# Patient Record
Sex: Female | Born: 1975
Health system: Southern US, Community
[De-identification: ages and names within clinical notes are randomized; demographics above are authoritative.]

## PROBLEM LIST (undated history)

## (undated) ENCOUNTER — Inpatient Hospital Stay (HOSPITAL_COMMUNITY): Payer: Self-pay

## (undated) DIAGNOSIS — G971 Other reaction to spinal and lumbar puncture: Secondary | ICD-10-CM

## (undated) DIAGNOSIS — I10 Essential (primary) hypertension: Secondary | ICD-10-CM

## (undated) HISTORY — PX: NO PAST SURGERIES: SHX2092

## (undated) HISTORY — DX: Essential (primary) hypertension: I10

## (undated) HISTORY — DX: Other reaction to spinal and lumbar puncture: G97.1

---

## 2011-12-01 NOTE — L&D Delivery Note (Signed)
Delivery Note At 8:53 AM a viable and healthy female was delivered via Vaginal, Spontaneous Delivery (Presentation: ; Occiput Anterior) after reduction of loose nuchal cord.  APGAR: 4, 9; weight 8 lb 4.3 oz (3751 g).   Placenta status: Intact, Spontaneous.  Cord: 3 vessels with the following complications: None.  Cord pH: 6.96  Anesthesia: None Local  Episiotomy: None Lacerations: 2nd degree Suture Repair: 3.0 monocryl Est. Blood Loss (mL): 250  Mom to postpartum.  Baby to nursery-stable.  [name] 09/04/2012, 9:30 AM    I was present for delivery and agree with note above. [facility]

## 2012-06-09 ENCOUNTER — Encounter: Payer: Self-pay | Admitting: Physician Assistant

## 2012-06-10 ENCOUNTER — Encounter (HOSPITAL_COMMUNITY): Payer: Self-pay | Admitting: *Deleted

## 2012-06-10 ENCOUNTER — Inpatient Hospital Stay (HOSPITAL_COMMUNITY)
Admission: AD | Admit: 2012-06-10 | Discharge: 2012-06-10 | Disposition: A | Discharge status: Home / Self Care | Payer: Medicaid Other | Source: Ambulatory Visit | Attending: Obstetrics & Gynecology | Admitting: Obstetrics & Gynecology

## 2012-06-10 DIAGNOSIS — Z3490 Encounter for supervision of normal pregnancy, unspecified, unspecified trimester: Secondary | ICD-10-CM

## 2012-06-10 DIAGNOSIS — O99891 Other specified diseases and conditions complicating pregnancy: Secondary | ICD-10-CM | POA: Insufficient documentation

## 2012-06-10 NOTE — MAU Provider Note (Signed)
Subjective: Patient has no complaints. States a car came to her house and drove her here.  She has no idea why she is here.   FHT: baseline 150, moderate variability, no accels or decels.  Will discharge home.  She has an appointment in the [facility] clinic on July 22 at 8 am.

## 2012-06-10 NOTE — MAU Note (Signed)
Pt has no complaints per interpreter. States she has no problems.

## 2012-06-10 NOTE — MAU Note (Signed)
Dr Birdie Sons in with patient discussing plan of care with interpreter. Pt states she was told to come here by her social worker Susie.

## 2012-06-12 NOTE — MAU Provider Note (Signed)
I have seen/examined this patient and agree with above. Terron Merfeld E.  

## 2012-06-20 ENCOUNTER — Encounter: Payer: Self-pay | Admitting: Obstetrics & Gynecology

## 2012-06-22 ENCOUNTER — Other Ambulatory Visit: Payer: Self-pay | Admitting: *Deleted

## 2012-06-22 DIAGNOSIS — O0930 Supervision of pregnancy with insufficient antenatal care, unspecified trimester: Secondary | ICD-10-CM

## 2012-06-22 NOTE — Progress Notes (Signed)
Representative from [employer] arrived to clinic registration window requesting pregnancy verification letter so that pt can apply for medicaid. Pt has no prenatal records and has first clinic visit on 06/30/12.  I scheduled [place] for pt on 06/27/12 @ 1500 and told the representative that I can compose the verification letter after pt's [place] is completed.  He will inform Ms. Richardson.

## 2012-06-27 ENCOUNTER — Ambulatory Visit (HOSPITAL_COMMUNITY)
Admission: RE | Admit: 2012-06-27 | Discharge: 2012-06-27 | Disposition: A | Discharge status: Home / Self Care | Payer: Medicaid Other | Source: Ambulatory Visit | Attending: Obstetrics & Gynecology | Admitting: Obstetrics & Gynecology

## 2012-06-27 ENCOUNTER — Encounter: Payer: Self-pay | Admitting: Obstetrics & Gynecology

## 2012-06-27 DIAGNOSIS — Z3689 Encounter for other specified antenatal screening: Secondary | ICD-10-CM | POA: Insufficient documentation

## 2012-06-27 DIAGNOSIS — O0930 Supervision of pregnancy with insufficient antenatal care, unspecified trimester: Secondary | ICD-10-CM | POA: Insufficient documentation

## 2012-06-29 ENCOUNTER — Ambulatory Visit (HOSPITAL_COMMUNITY): Payer: Self-pay

## 2012-06-30 ENCOUNTER — Encounter: Payer: Self-pay | Admitting: Physician Assistant

## 2012-07-04 ENCOUNTER — Encounter: Payer: Self-pay | Admitting: Family Medicine

## 2012-07-25 ENCOUNTER — Encounter: Payer: Self-pay | Admitting: Family Medicine

## 2012-07-25 ENCOUNTER — Ambulatory Visit (INDEPENDENT_AMBULATORY_CARE_PROVIDER_SITE_OTHER): Payer: Medicaid Other | Admitting: Family Medicine

## 2012-07-25 ENCOUNTER — Other Ambulatory Visit (HOSPITAL_COMMUNITY)
Admission: RE | Admit: 2012-07-25 | Discharge: 2012-07-25 | Disposition: A | Discharge status: Home / Self Care | Payer: Medicaid Other | Source: Ambulatory Visit | Attending: Family Medicine | Admitting: Family Medicine

## 2012-07-25 VITALS — BP 125/86 | Temp 96.7°F | Ht 67.5 in | Wt 225.0 lb

## 2012-07-25 DIAGNOSIS — Z113 Encounter for screening for infections with a predominantly sexual mode of transmission: Secondary | ICD-10-CM | POA: Insufficient documentation

## 2012-07-25 DIAGNOSIS — Z01419 Encounter for gynecological examination (general) (routine) without abnormal findings: Secondary | ICD-10-CM | POA: Insufficient documentation

## 2012-07-25 DIAGNOSIS — R87810 Cervical high risk human papillomavirus (HPV) DNA test positive: Secondary | ICD-10-CM | POA: Insufficient documentation

## 2012-07-25 DIAGNOSIS — O0930 Supervision of pregnancy with insufficient antenatal care, unspecified trimester: Secondary | ICD-10-CM | POA: Insufficient documentation

## 2012-07-25 LAB — CBC
Hemoglobin: 11.3 g/dL — ABNORMAL LOW (ref 12.0–15.0)
MCHC: 33.9 g/dL (ref 30.0–36.0)
Platelets: 240 10*3/uL (ref 150–400)
RBC: 4.17 MIL/uL (ref 3.87–5.11)

## 2012-07-25 LAB — DIFFERENTIAL
Basophils Relative: 0 % (ref 0–1)
Eosinophils Absolute: 0.1 10*3/uL (ref 0.0–0.7)
Monocytes Relative: 7 % (ref 3–12)
Neutro Abs: 4.6 10*3/uL (ref 1.7–7.7)
Neutrophils Relative %: 66 % (ref 43–77)

## 2012-07-25 LAB — POCT URINALYSIS DIP (DEVICE)
Bilirubin Urine: NEGATIVE
Ketones, ur: NEGATIVE mg/dL
Protein, ur: 30 mg/dL — AB
Specific Gravity, Urine: >=1.03 (ref 1.005–1.030)
pH: 5.5 (ref 5.0–8.0)

## 2012-07-25 MED ORDER — PRENATAL VITAMINS 0.8 MG PO TABS: 1.0000 | ORAL_TABLET | Freq: Every day | ORAL | Status: DC

## 2012-07-25 NOTE — Progress Notes (Signed)
New pt. Today in country x 2 months.  Has normal OB hx, no risk factors.  Reports care in [place], but no records.  Reports several u/s that confirmed due date of 10/2--She is sure of LMP and reports regular monthly cycles. Will get labs for her today, including, OB panel, 1 hour GCT, pap and GC/Chlam--will need GBS at next visit.  S>D obese.-U/S 7/29 showed 78%, symmetric. 1+ prot, but small amount of blood. Female circumcision noted.  Cx is long and closed.

## 2012-07-25 NOTE — Progress Notes (Signed)
Pulse 115 Has no prenatal records; Needs OB panel, HIV, pap smear; 1 hr gtt @1124 

## 2012-07-25 NOTE — Patient Instructions (Signed)

## 2012-07-26 LAB — RUBELLA SCREEN: Rubella: 185.5 IU/mL — ABNORMAL HIGH

## 2012-07-26 LAB — RPR

## 2012-07-26 LAB — HEPATITIS B SURFACE ANTIGEN: Hepatitis B Surface Ag: NEGATIVE

## 2012-07-26 LAB — CULTURE, OB URINE

## 2012-07-26 LAB — RH TYPE: Rh Type: POSITIVE

## 2012-07-26 LAB — ABO

## 2012-07-28 ENCOUNTER — Encounter: Payer: Self-pay | Admitting: Family Medicine

## 2012-07-28 ENCOUNTER — Telehealth: Payer: Self-pay | Admitting: *Deleted

## 2012-07-28 DIAGNOSIS — O0930 Supervision of pregnancy with insufficient antenatal care, unspecified trimester: Secondary | ICD-10-CM

## 2012-07-28 NOTE — Telephone Encounter (Signed)
Called [facility] and spoke with patients case worker [place] who gave me the patients phone number. Phone number [phone].

## 2012-07-28 NOTE — Telephone Encounter (Signed)
Message copied by Mannie Stabile on Thu Jul 28, 2012 11:32 AM ------      Message from: Reva Bores      Created: Thu Jul 28, 2012 11:28 AM       sched 3 hour

## 2012-07-28 NOTE — Telephone Encounter (Signed)
Called pt using language line interpreter # [phone] Arabic. Phone number is not for patient it is a company.

## 2012-07-28 NOTE — Telephone Encounter (Signed)
Called patient with Arabic interpreter from language line. It was determined that we did not have the appropriate dialect after trying to inform pt of results. Pt gave permission for [place] to speak to her husband. Her husband also did not understand the Arabic interpreter, We disconnected. I called again and used a [place] Arabic interpreter [phone]. Pts husband answered the phone and again the patient gave permission for [place] to talk to her husband. He immediately became very upset yelling and wouldn't let the interpreter speak. He then hung up and the interpreter told me that he thought that we said pt was a diabetic. He thinks we made her have diabetes by giving her the sugar drink. He says that his wife was never sick before she came to [place]. He stated that he does not trust [place]. I discussed with the interpreter trying to call the patient once more to try to explain the results again and try to help him understand that we are not giving her a diagnosis. We called again and husband answered the phone. He was calm and allowed the interpreter to fully explain the results and the purpose of the 3hr test. Also explained the health risks of gestational diabetes including fetal death. Pts husband again became irate and refused to listen any further. He told interpreter that he doesn't trust [place] and doesn't want his wife to come back to our clinic or any clinic for that matter. He said that they would just wait until her due date and then call 911. He told the interpreter that he doesn't want to speak with [place] any further. I thanked interpreter for her help.  I called [facility] back to talk with a case Production designer, theatre/television/film. I talked to [place] who is familiar with patient and her husband. I informed her that I couldn't give her any detailed results but that I needed her help getting the patient and her husband to come in for a visit to talk with a doctor. I told her to reassure them that we would not be doing any test or procedures  without their permission. Aundra Millet stated that she will talk with them this afternoon and felt confident that she could get them to come in. Pt scheduled for 0830 on 9/5. Aundra Millet will call me back if there is any problem. She said she would also try to get them a ride to the appt so that they will not have to take the bus.

## 2012-08-04 ENCOUNTER — Ambulatory Visit (INDEPENDENT_AMBULATORY_CARE_PROVIDER_SITE_OTHER): Payer: Medicaid Other | Admitting: Family Medicine

## 2012-08-04 VITALS — BP 122/81 | Temp 97.4°F | Wt 225.0 lb

## 2012-08-04 DIAGNOSIS — O0930 Supervision of pregnancy with insufficient antenatal care, unspecified trimester: Secondary | ICD-10-CM

## 2012-08-04 LAB — OB RESULTS CONSOLE GBS: GBS: NEGATIVE

## 2012-08-04 LAB — POCT URINALYSIS DIP (DEVICE)
Bilirubin Urine: NEGATIVE
Hgb urine dipstick: NEGATIVE
Ketones, ur: NEGATIVE mg/dL
Protein, ur: NEGATIVE mg/dL
Specific Gravity, Urine: 1.025 (ref 1.005–1.030)
pH: 5.5 (ref 5.0–8.0)

## 2012-08-04 NOTE — Patient Instructions (Signed)
Breastfeeding BENEFITS OF BREASTFEEDING For the baby  The first milk (colostrum) helps the baby's digestive system function better.   There are antibodies from the mother in the milk that help the baby fight off infections.   The baby has a lower incidence of asthma, allergies, and SIDS (sudden infant death syndrome).   The nutrients in breast milk are better than formulas for the baby and helps the baby's brain grow better.   Babies who breastfeed have less gas, colic, and constipation.  For the mother  Breastfeeding helps develop a very special bond between mother and baby.   It is more convenient, always available at the correct temperature and cheaper than formula feeding.   It burns calories in the mother and helps with losing weight that was gained during pregnancy.   It makes the uterus contract back down to normal size faster and slows bleeding following delivery.   Breastfeeding mothers have a lower risk of developing breast cancer.  NURSE FREQUENTLY  A healthy, full-term baby may breastfeed as often as every hour or space his or her feedings to every 3 hours.   How often to nurse will vary from baby to baby. Watch your baby for signs of hunger, not the clock.   Nurse as often as the baby requests, or when you feel the need to reduce the fullness of your breasts.   Awaken the baby if it has been 3 to 4 hours since the last feeding.   Frequent feeding will help the mother make more milk and will prevent problems like sore nipples and engorgement of the breasts.  BABY'S POSITION AT THE BREAST  Whether lying down or sitting, be sure that the baby's tummy is facing your tummy.   Support the breast with 4 fingers underneath the breast and the thumb above. Make sure your fingers are well away from the nipple and baby's mouth.   Stroke the baby's lips and cheek closest to the breast gently with your finger or nipple.   When the baby's mouth is open wide enough, place all  of your nipple and as much of the dark area around the nipple as possible into your baby's mouth.   Pull the baby in close so the tip of the nose and the baby's cheeks touch the breast during the feeding.  FEEDINGS  The length of each feeding varies from baby to baby and from feeding to feeding.   The baby must suck about 2 to 3 minutes for your milk to get to him or her. This is called a "let down." For this reason, allow the baby to feed on each breast as long as he or she wants. Your baby will end the feeding when he or she has received the right balance of nutrients.   To break the suction, put your finger into the corner of the baby's mouth and slide it between his or her gums before removing your breast from his or her mouth. This will help prevent sore nipples.  REDUCING BREAST ENGORGEMENT  In the first week after your baby is born, you may experience signs of breast engorgement. When breasts are engorged, they feel heavy, warm, full, and may be tender to the touch. You can reduce engorgement if you:   Nurse frequently, every 2 to 3 hours. Mothers who breastfeed early and often have fewer problems with engorgement.   Place light ice packs on your breasts between feedings. This reduces swelling. Wrap the ice packs in a   lightweight towel to protect your skin.   Apply moist hot packs to your breast for 5 to 10 minutes before each feeding. This increases circulation and helps the milk flow.   Gently massage your breast before and during the feeding.   Make sure that the baby empties at least one breast at every feeding before switching sides.   Use a breast pump to empty the breasts if your baby is sleepy or not nursing well. You may also want to pump if you are returning to work or or you feel you are getting engorged.   Avoid bottle feeds, pacifiers or supplemental feedings of water or juice in place of breastfeeding.   Be sure the baby is latched on and positioned properly while  breastfeeding.   Prevent fatigue, stress, and anemia.   Wear a supportive bra, avoiding underwire styles.   Eat a balanced diet with enough fluids.  If you follow these suggestions, your engorgement should improve in 24 to 48 hours. If you are still experiencing difficulty, call your lactation consultant or caregiver. IS MY BABY GETTING ENOUGH MILK? Sometimes, mothers worry about whether their babies are getting enough milk. You can be assured that your baby is getting enough milk if:  The baby is actively sucking and you hear swallowing.   The baby nurses at least 8 to 12 times in a 24 hour time period. Nurse your baby until he or she unlatches or falls asleep at the first breast (at least 10 to 20 minutes), then offer the second side.   The baby is wetting 5 to 6 disposable diapers (6 to 8 cloth diapers) in a 24 hour period by 5 to 6 days of age.   The baby is having at least 2 to 3 stools every 24 hours for the first few months. Breast milk is all the food your baby needs. It is not necessary for your baby to have water or formula. In fact, to help your breasts make more milk, it is best not to give your baby supplemental feedings during the early weeks.   The stool should be soft and yellow.   The baby should gain 4 to 7 ounces per week after he is 4 days old.  TAKE CARE OF YOURSELF Take care of your breasts by:  Bathing or showering daily.   Avoiding the use of soaps on your nipples.   Start feedings on your left breast at one feeding and on your right breast at the next feeding.   You will notice an increase in your milk supply 2 to 5 days after delivery. You may feel some discomfort from engorgement, which makes your breasts very firm and often tender. Engorgement "peaks" out within 24 to 48 hours. In the meantime, apply warm moist towels to your breasts for 5 to 10 minutes before feeding. Gentle massage and expression of some milk before feeding will soften your breasts, making  it easier for your baby to latch on. Wear a well fitting nursing bra and air dry your nipples for 10 to 15 minutes after each feeding.   Only use cotton bra pads.   Only use pure lanolin on your nipples after nursing. You do not need to wash it off before nursing.  Take care of yourself by:   Eating well-balanced meals and nutritious snacks.   Drinking milk, fruit juice, and water to satisfy your thirst (about 8 glasses a day).   Getting plenty of rest.   Increasing calcium in   your diet (1200 mg a day).   Avoiding foods that you notice affect the baby in a bad way.  SEEK MEDICAL CARE IF:   You have any questions or difficulty with breastfeeding.   You need help.   You have a hard, red, sore area on your breast, accompanied by a fever of 100.5 F (38.1 C) or more.   Your baby is too sleepy to eat well or is having trouble sleeping.   Your baby is wetting less than 6 diapers per day, by 5 days of age.   Your baby's skin or white part of his or her eyes is more yellow than it was in the hospital.   You feel depressed.  Document Released: 11/16/2005 Document Revised: 11/05/2011 Document Reviewed: 07/01/2009 Exit[employer] Patient Information 2012 [employer]. 

## 2012-08-04 NOTE — Progress Notes (Signed)
GBS today Pt. Refuses 3 hour test---Random BS today not fasting is 79.

## 2012-08-04 NOTE — Progress Notes (Signed)
P=106 

## 2012-08-08 ENCOUNTER — Encounter: Payer: Self-pay | Admitting: Family Medicine

## 2012-08-10 ENCOUNTER — Encounter: Payer: Self-pay | Admitting: Obstetrics and Gynecology

## 2012-08-10 ENCOUNTER — Ambulatory Visit (INDEPENDENT_AMBULATORY_CARE_PROVIDER_SITE_OTHER): Payer: Medicaid Other | Admitting: Obstetrics and Gynecology

## 2012-08-10 ENCOUNTER — Telehealth: Payer: Self-pay | Admitting: *Deleted

## 2012-08-10 VITALS — BP 117/79 | Temp 97.3°F | Wt 223.8 lb

## 2012-08-10 DIAGNOSIS — Z22330 Carrier of Group B streptococcus: Secondary | ICD-10-CM

## 2012-08-10 DIAGNOSIS — O09899 Supervision of other high risk pregnancies, unspecified trimester: Secondary | ICD-10-CM

## 2012-08-10 DIAGNOSIS — O99820 Streptococcus B carrier state complicating pregnancy: Secondary | ICD-10-CM

## 2012-08-10 DIAGNOSIS — O0930 Supervision of pregnancy with insufficient antenatal care, unspecified trimester: Secondary | ICD-10-CM

## 2012-08-10 LAB — POCT URINALYSIS DIP (DEVICE)
Glucose, UA: NEGATIVE mg/dL
Ketones, ur: NEGATIVE mg/dL
Protein, ur: 30 mg/dL — AB
Specific Gravity, Urine: 1.025 (ref 1.005–1.030)

## 2012-08-10 MED ORDER — FLUCONAZOLE 150 MG PO TABS: 150.0000 mg | ORAL_TABLET | Freq: Once | ORAL | Status: DC

## 2012-08-10 NOTE — Addendum Note (Signed)
Addended by: Jill Side on: 08/10/2012 02:22 PM   Modules accepted: Orders

## 2012-08-10 NOTE — Telephone Encounter (Signed)
Called pt and left message that we need her to call back and give [place] the name and street location of her pharmacy.  * Once pt has provided the information, her Rx for Diflucan from today needs to be called in or sent electronically.

## 2012-08-10 NOTE — Progress Notes (Signed)
Discussed contraception options -> elects Depo. GC/CT, GBS sent. Reviewed no seets diet. Declines cx exam. Check RCBG next.

## 2012-08-10 NOTE — Patient Instructions (Signed)
Pregnancy - Third Trimester The third trimester of pregnancy (the last 3 months) is a period of the most rapid growth for you and your baby. The baby approaches a length of 20 inches and a weight of 6 to 10 pounds. The baby is adding on fat and getting ready for life outside your body. While inside, babies have periods of sleeping and waking, suck their thumbs, and hiccups. You can often feel small contractions of the uterus. This is false labor. It is also called Braxton-Hicks contractions. This is like a practice for labor. The usual problems in this stage of pregnancy include more difficulty breathing, swelling of the hands and feet from water retention, and having to urinate more often because of the uterus and baby pressing on your bladder.  PRENATAL EXAMS  Blood work may continue to be done during prenatal exams. These tests are done to check on your health and the probable health of your baby. Blood work is used to follow your blood levels (hemoglobin). Anemia (low hemoglobin) is common during pregnancy. Iron and vitamins are given to help prevent this. You may also continue to be checked for diabetes. Some of the past blood tests may be done again.   The size of the uterus is measured during each visit. This makes sure your baby is growing properly according to your pregnancy dates.   Your blood pressure is checked every prenatal visit. This is to make sure you are not getting toxemia.   Your urine is checked every prenatal visit for infection, diabetes and protein.   Your weight is checked at each visit. This is done to make sure gains are happening at the suggested rate and that you and your baby are growing normally.   Sometimes, an ultrasound is performed to confirm the position and the proper growth and development of the baby. This is a test done that bounces harmless sound waves off the baby so your caregiver can more accurately determine due dates.   Discuss the type of pain  medication and anesthesia you will have during your labor and delivery.   Discuss the possibility and anesthesia if a Cesarean Section might be necessary.   Inform your caregiver if there is any mental or physical violence at home.  Sometimes, a specialized non-stress test, contraction stress test and biophysical profile are done to make sure the baby is not having a problem. Checking the amniotic fluid surrounding the baby is called an amniocentesis. The amniotic fluid is removed by sticking a needle into the belly (abdomen). This is sometimes done near the end of pregnancy if an early delivery is required. In this case, it is done to help make sure the baby's lungs are mature enough for the baby to live outside of the womb. If the lungs are not mature and it is unsafe to deliver the baby, an injection of cortisone medication is given to the mother 1 to 2 days before the delivery. This helps the baby's lungs mature and makes it safer to deliver the baby. CHANGES OCCURING IN THE THIRD TRIMESTER OF PREGNANCY Your body goes through many changes during pregnancy. They vary from person to person. Talk to your caregiver about changes you notice and are concerned about.  During the last trimester, you have probably had an increase in your appetite. It is normal to have cravings for certain foods. This varies from person to person and pregnancy to pregnancy.   You may begin to get stretch marks on your hips,   abdomen, and breasts. These are normal changes in the body during pregnancy. There are no exercises or medications to take which prevent this change.   Constipation may be treated with a stool softener or adding bulk to your diet. Drinking lots of fluids, fiber in vegetables, fruits, and whole grains are helpful.   Exercising is also helpful. If you have been very active up until your pregnancy, most of these activities can be continued during your pregnancy. If you have been less active, it is helpful  to start an exercise program such as walking. Consult your caregiver before starting exercise programs.   Avoid all smoking, alcohol, un-prescribed drugs, herbs and "street drugs" during your pregnancy. These chemicals affect the formation and growth of the baby. Avoid chemicals throughout the pregnancy to ensure the delivery of a healthy infant.   Backache, varicose veins and hemorrhoids may develop or get worse.   You will tire more easily in the third trimester, which is normal.   The baby's movements may be stronger and more often.   You may become short of breath easily.   Your belly button may stick out.   A yellow discharge may leak from your breasts called colostrum.   You may have a bloody mucus discharge. This usually occurs a few days to a week before labor begins.  HOME CARE INSTRUCTIONS   Keep your caregiver's appointments. Follow your caregiver's instructions regarding medication use, exercise, and diet.   During pregnancy, you are providing food for you and your baby. Continue to eat regular, well-balanced meals. Choose foods such as meat, fish, milk and other low fat dairy products, vegetables, fruits, and whole-grain breads and cereals. Your caregiver will tell you of the ideal weight gain.   A physical sexual relationship may be continued throughout pregnancy if there are no other problems such as early (premature) leaking of amniotic fluid from the membranes, vaginal bleeding, or belly (abdominal) pain.   Exercise regularly if there are no restrictions. Check with your caregiver if you are unsure of the safety of your exercises. Greater weight gain will occur in the last 2 trimesters of pregnancy. Exercising helps:   Control your weight.   Get you in shape for labor and delivery.   You lose weight after you deliver.   Rest a lot with legs elevated, or as needed for leg cramps or low back pain.   Wear a good support or jogging bra for breast tenderness during  pregnancy. This may help if worn during sleep. Pads or tissues may be used in the bra if you are leaking colostrum.   Do not use hot tubs, steam rooms, or saunas.   Wear your seat belt when driving. This protects you and your baby if you are in an accident.   Avoid raw meat, cat litter boxes and soil used by cats. These carry germs that can cause birth defects in the baby.   It is easier to loose urine during pregnancy. Tightening up and strengthening the pelvic muscles will help with this problem. You can practice stopping your urination while you are going to the bathroom. These are the same muscles you need to strengthen. It is also the muscles you would use if you were trying to stop from passing gas. You can practice tightening these muscles up 10 times a set and repeating this about 3 times per day. Once you know what muscles to tighten up, do not perform these exercises during urination. It is more likely   to cause an infection by backing up the urine.   Ask for help if you have financial, counseling or nutritional needs during pregnancy. Your caregiver will be able to offer counseling for these needs as well as refer you for other special needs.   Make a list of emergency phone numbers and have them available.   Plan on getting help from family or friends when you go home from the hospital.   Make a trial run to the hospital.   Take prenatal classes with the father to understand, practice and ask questions about the labor and delivery.   Prepare the baby's room/nursery.   Do not travel out of the city unless it is absolutely necessary and with the advice of your caregiver.   Wear only low or no heal shoes to have better balance and prevent falling.  MEDICATIONS AND DRUG USE IN PREGNANCY  Take prenatal vitamins as directed. The vitamin should contain 1 milligram of folic acid. Keep all vitamins out of reach of children. Only a couple vitamins or tablets containing iron may be fatal  to a baby or young child when ingested.   Avoid use of all medications, including herbs, over-the-counter medications, not prescribed or suggested by your caregiver. Only take over-the-counter or prescription medicines for pain, discomfort, or fever as directed by your caregiver. Do not use aspirin, ibuprofen (Motrin, Advil, Nuprin) or naproxen (Aleve) unless OK'd by your caregiver.   Let your caregiver also know about herbs you may be using.   Alcohol is related to a number of birth defects. This includes fetal alcohol syndrome. All alcohol, in any form, should be avoided completely. Smoking will cause low birth rate and premature babies.   Street/illegal drugs are very harmful to the baby. They are absolutely forbidden. A baby born to an addicted mother will be addicted at birth. The baby will go through the same withdrawal an adult does.  SEEK MEDICAL CARE IF: You have any concerns or worries during your pregnancy. It is better to call with your questions if you feel they cannot wait, rather than worry about them. DECISIONS ABOUT CIRCUMCISION You may or may not know the sex of your baby. If you know your baby is a boy, it may be time to think about circumcision. Circumcision is the removal of the foreskin of the penis. This is the skin that covers the sensitive end of the penis. There is no proven medical need for this. Often this decision is made on what is popular at the time or based upon religious beliefs and social issues. You can discuss these issues with your caregiver or pediatrician. SEEK IMMEDIATE MEDICAL CARE IF:   An unexplained oral temperature above 102 F (38.9 C) develops, or as your caregiver suggests.   You have leaking of fluid from the vagina (birth canal). If leaking membranes are suspected, take your temperature and tell your caregiver of this when you call.   There is vaginal spotting, bleeding or passing clots. Tell your caregiver of the amount and how many pads are  used.   You develop a bad smelling vaginal discharge with a change in the color from clear to white.   You develop vomiting that lasts more than 24 hours.   You develop chills or fever.   You develop shortness of breath.   You develop burning on urination.   You loose more than 2 pounds of weight or gain more than 2 pounds of weight or as suggested by your   caregiver.   You notice sudden swelling of your face, hands, and feet or legs.   You develop belly (abdominal) pain. Round ligament discomfort is a common non-cancerous (benign) cause of abdominal pain in pregnancy. Your caregiver still must evaluate you.   You develop a severe headache that does not go away.   You develop visual problems, blurred or double vision.   If you have not felt your baby move for more than 1 hour. If you think the baby is not moving as much as usual, eat something with sugar in it and lie down on your left side for an hour. The baby should move at least 4 to 5 times per hour. Call right away if your baby moves less than that.   You fall, are in a car accident or any kind of trauma.   There is mental or physical violence at home.  Document Released: 11/10/2001 Document Revised: 11/05/2011 Document Reviewed: 05/15/2009 ExitCare Patient Information 2012 [employer]. 

## 2012-08-10 NOTE — Progress Notes (Signed)
Pulse: 105 Needs GC/Ch

## 2012-08-11 LAB — GC/CHLAMYDIA PROBE AMP, GENITAL
Chlamydia, DNA Probe: NEGATIVE
GC Probe Amp, Genital: NEGATIVE

## 2012-08-11 MED ORDER — FLUCONAZOLE 150 MG PO TABS: 150.0000 mg | ORAL_TABLET | Freq: Once | ORAL | Status: DC

## 2012-08-11 MED ORDER — FLUCONAZOLE 150 MG PO TABS: 150.0000 mg | ORAL_TABLET | Freq: Once | ORAL | Status: AC

## 2012-08-11 NOTE — Telephone Encounter (Signed)
Spoke to family member and asked what pharmacy that pt used.  They informed me [facility] on [address].  I informed him that she can go to that pharmacy and pick medication its a one pill one time dose and to call if she has any questions. Family member said ok.

## 2012-08-14 LAB — CULTURE, BETA STREP (GROUP B ONLY)

## 2012-08-16 DIAGNOSIS — O99820 Streptococcus B carrier state complicating pregnancy: Secondary | ICD-10-CM | POA: Insufficient documentation

## 2012-08-17 ENCOUNTER — Ambulatory Visit (INDEPENDENT_AMBULATORY_CARE_PROVIDER_SITE_OTHER): Payer: Medicaid Other | Admitting: Advanced Practice Midwife

## 2012-08-17 ENCOUNTER — Encounter: Payer: Self-pay | Admitting: Advanced Practice Midwife

## 2012-08-17 VITALS — BP 126/86 | Temp 98.0°F | Wt 226.3 lb

## 2012-08-17 DIAGNOSIS — N90810 Female genital mutilation status, unspecified: Secondary | ICD-10-CM

## 2012-08-17 DIAGNOSIS — O0930 Supervision of pregnancy with insufficient antenatal care, unspecified trimester: Secondary | ICD-10-CM

## 2012-08-17 LAB — POCT URINALYSIS DIP (DEVICE)
Bilirubin Urine: NEGATIVE
Ketones, ur: NEGATIVE mg/dL
Nitrite: NEGATIVE
pH: 6 (ref 5.0–8.0)

## 2012-08-17 NOTE — Progress Notes (Signed)
Pulse-  95 

## 2012-08-17 NOTE — Patient Instructions (Signed)
Normal Labor and Delivery Your caregiver must first be sure you are in labor. Signs of labor include:  You may pass what is called "the mucus plug" before labor begins. This is a small amount of blood stained mucus.   Regular uterine contractions.   The time between contractions get closer together.   The discomfort and pain gradually gets more intense.   Pains are mostly located in the back.   Pains get worse when walking.   The cervix (the opening of the uterus becomes thinner (begins to efface) and opens up (dilates).  Once you are in labor and admitted into the hospital or care center, your caregiver will do the following:  A complete physical examination.   Check your vital signs (blood pressure, pulse, temperature and the fetal heart rate).   Do a vaginal examination (using a sterile glove and lubricant) to determine:   The position (presentation) of the baby (head [vertex] or buttock first).   The level (station) of the baby's head in the birth canal.   The effacement and dilatation of the cervix.   You may have your pubic hair shaved and be given an enema depending on your caregiver and the circumstance.   An electronic monitor is usually placed on your abdomen. The monitor follows the length and intensity of the contractions, as well as the baby's heart rate.   Usually, your caregiver will insert an IV in your arm with a bottle of sugar water. This is done as a precaution so that medications can be given to you quickly during labor or delivery.  NORMAL LABOR AND DELIVERY IS DIVIDED UP INTO 3 STAGES: First Stage This is when regular contractions begin and the cervix begins to efface and dilate. This stage can last from 3 to 15 hours. The end of the first stage is when the cervix is 100% effaced and 10 centimeters dilated. Pain medications may be given by   Injection (morphine, demerol, etc.)   Regional anesthesia (spinal, caudal or epidural, anesthetics given in  different locations of the spine). Paracervical pain medication may be given, which is an injection of and anesthetic on each side of the cervix.  A pregnant woman may request to have "Natural Childbirth" which is not to have any medications or anesthesia during her labor and delivery. Second Stage This is when the baby comes down through the birth canal (vagina) and is born. This can take 1 to 4 hours. As the baby's head comes down through the birth canal, you may feel like you are going to have a bowel movement. You will get the urge to bear down and push until the baby is delivered. As the baby's head is being delivered, the caregiver will decide if an episiotomy (a cut in the perineum and vagina area) is needed to prevent tearing of the tissue in this area. The episiotomy is sewn up after the delivery of the baby and placenta. Sometimes a mask with nitrous oxide is given for the mother to breath during the delivery of the baby to help if there is too much pain. The end of Stage 2 is when the baby is fully delivered. Then when the umbilical cord stops pulsating it is clamped and cut. Third Stage The third stage begins after the baby is completely delivered and ends after the placenta (afterbirth) is delivered. This usually takes 5 to 30 minutes. After the placenta is delivered, a medication is given either by intravenous or injection to help contract   the uterus and prevent bleeding. The third stage is not painful and pain medication is usually not necessary. If an episiotomy was done, it is repaired at this time. After the delivery, the mother is watched and monitored closely for 1 to 2 hours to make sure there is no postpartum bleeding (hemorrhage). If there is a lot of bleeding, medication is given to contract the uterus and stop the bleeding. Document Released: 08/25/2008 Document Revised: 11/05/2011 Document Reviewed: 08/25/2008 ExitCare Patient Information 2012 [employer]. 

## 2012-08-17 NOTE — Progress Notes (Signed)
Doing well. Occasional contractions. Declines Cx exam. Does report having had female circumcision. CBG pending. Labor precautions reviewed.

## 2012-08-24 ENCOUNTER — Ambulatory Visit (HOSPITAL_COMMUNITY)
Admission: RE | Admit: 2012-08-24 | Discharge: 2012-08-24 | Disposition: A | Discharge status: Home / Self Care | Payer: Medicaid Other | Source: Ambulatory Visit | Attending: Family Medicine | Admitting: Family Medicine

## 2012-08-24 ENCOUNTER — Other Ambulatory Visit: Payer: Self-pay | Admitting: Family Medicine

## 2012-08-24 ENCOUNTER — Ambulatory Visit (INDEPENDENT_AMBULATORY_CARE_PROVIDER_SITE_OTHER): Payer: Medicaid Other | Admitting: Family Medicine

## 2012-08-24 VITALS — BP 132/83 | Temp 97.7°F | Wt 227.1 lb

## 2012-08-24 DIAGNOSIS — N90810 Female genital mutilation status, unspecified: Secondary | ICD-10-CM

## 2012-08-24 DIAGNOSIS — O3660X0 Maternal care for excessive fetal growth, unspecified trimester, not applicable or unspecified: Secondary | ICD-10-CM

## 2012-08-24 DIAGNOSIS — R7309 Other abnormal glucose: Secondary | ICD-10-CM

## 2012-08-24 DIAGNOSIS — O0930 Supervision of pregnancy with insufficient antenatal care, unspecified trimester: Secondary | ICD-10-CM

## 2012-08-24 DIAGNOSIS — Z3490 Encounter for supervision of normal pregnancy, unspecified, unspecified trimester: Secondary | ICD-10-CM

## 2012-08-24 LAB — GLUCOSE, CAPILLARY: Glucose-Capillary: 73 mg/dL (ref 70–99)

## 2012-08-24 LAB — POCT URINALYSIS DIP (DEVICE)
Glucose, UA: NEGATIVE mg/dL
Ketones, ur: NEGATIVE mg/dL
Protein, ur: 30 mg/dL — AB
Urobilinogen, UA: 0.2 mg/dL (ref 0.0–1.0)

## 2012-08-24 MED ORDER — INFLUENZA VIRUS VACC SPLIT PF IM SUSP
0.5000 mL | Freq: Once | INTRAMUSCULAR | Status: AC
  Administered 2012-08-24: 0.5 mL via INTRAMUSCULAR

## 2012-08-24 NOTE — Patient Instructions (Signed)
Pregnancy - Third Trimester The third trimester of pregnancy (the last 3 months) is a period of the most rapid growth for you and your baby. The baby approaches a length of 20 inches and a weight of 6 to 10 pounds. The baby is adding on fat and getting ready for life outside your body. While inside, babies have periods of sleeping and waking, suck their thumbs, and hiccups. You can often feel small contractions of the uterus. This is false labor. It is also called Braxton-Hicks contractions. This is like a practice for labor. The usual problems in this stage of pregnancy include more difficulty breathing, swelling of the hands and feet from water retention, and having to urinate more often because of the uterus and baby pressing on your bladder.  PRENATAL EXAMS  Blood work may continue to be done during prenatal exams. These tests are done to check on your health and the probable health of your baby. Blood work is used to follow your blood levels (hemoglobin). Anemia (low hemoglobin) is common during pregnancy. Iron and vitamins are given to help prevent this. You may also continue to be checked for diabetes. Some of the past blood tests may be done again.   The size of the uterus is measured during each visit. This makes sure your baby is growing properly according to your pregnancy dates.   Your blood pressure is checked every prenatal visit. This is to make sure you are not getting toxemia.   Your urine is checked every prenatal visit for infection, diabetes and protein.   Your weight is checked at each visit. This is done to make sure gains are happening at the suggested rate and that you and your baby are growing normally.   Sometimes, an ultrasound is performed to confirm the position and the proper growth and development of the baby. This is a test done that bounces harmless sound waves off the baby so your caregiver can more accurately determine due dates.   Discuss the type of pain  medication and anesthesia you will have during your labor and delivery.   Discuss the possibility and anesthesia if a Cesarean Section might be necessary.   Inform your caregiver if there is any mental or physical violence at home.  Sometimes, a specialized non-stress test, contraction stress test and biophysical profile are done to make sure the baby is not having a problem. Checking the amniotic fluid surrounding the baby is called an amniocentesis. The amniotic fluid is removed by sticking a needle into the belly (abdomen). This is sometimes done near the end of pregnancy if an early delivery is required. In this case, it is done to help make sure the baby's lungs are mature enough for the baby to live outside of the womb. If the lungs are not mature and it is unsafe to deliver the baby, an injection of cortisone medication is given to the mother 1 to 2 days before the delivery. This helps the baby's lungs mature and makes it safer to deliver the baby. CHANGES OCCURING IN THE THIRD TRIMESTER OF PREGNANCY Your body goes through many changes during pregnancy. They vary from person to person. Talk to your caregiver about changes you notice and are concerned about.  During the last trimester, you have probably had an increase in your appetite. It is normal to have cravings for certain foods. This varies from person to person and pregnancy to pregnancy.   You may begin to get stretch marks on your hips,   abdomen, and breasts. These are normal changes in the body during pregnancy. There are no exercises or medications to take which prevent this change.   Constipation may be treated with a stool softener or adding bulk to your diet. Drinking lots of fluids, fiber in vegetables, fruits, and whole grains are helpful.   Exercising is also helpful. If you have been very active up until your pregnancy, most of these activities can be continued during your pregnancy. If you have been less active, it is helpful  to start an exercise program such as walking. Consult your caregiver before starting exercise programs.   Avoid all smoking, alcohol, un-prescribed drugs, herbs and "street drugs" during your pregnancy. These chemicals affect the formation and growth of the baby. Avoid chemicals throughout the pregnancy to ensure the delivery of a healthy infant.   Backache, varicose veins and hemorrhoids may develop or get worse.   You will tire more easily in the third trimester, which is normal.   The baby's movements may be stronger and more often.   You may become short of breath easily.   Your belly button may stick out.   A yellow discharge may leak from your breasts called colostrum.   You may have a bloody mucus discharge. This usually occurs a few days to a week before labor begins.  HOME CARE INSTRUCTIONS   Keep your caregiver's appointments. Follow your caregiver's instructions regarding medication use, exercise, and diet.   During pregnancy, you are providing food for you and your baby. Continue to eat regular, well-balanced meals. Choose foods such as meat, fish, milk and other low fat dairy products, vegetables, fruits, and whole-grain breads and cereals. Your caregiver will tell you of the ideal weight gain.   A physical sexual relationship may be continued throughout pregnancy if there are no other problems such as early (premature) leaking of amniotic fluid from the membranes, vaginal bleeding, or belly (abdominal) pain.   Exercise regularly if there are no restrictions. Check with your caregiver if you are unsure of the safety of your exercises. Greater weight gain will occur in the last 2 trimesters of pregnancy. Exercising helps:   Control your weight.   Get you in shape for labor and delivery.   You lose weight after you deliver.   Rest a lot with legs elevated, or as needed for leg cramps or low back pain.   Wear a good support or jogging bra for breast tenderness during  pregnancy. This may help if worn during sleep. Pads or tissues may be used in the bra if you are leaking colostrum.   Do not use hot tubs, steam rooms, or saunas.   Wear your seat belt when driving. This protects you and your baby if you are in an accident.   Avoid raw meat, cat litter boxes and soil used by cats. These carry germs that can cause birth defects in the baby.   It is easier to loose urine during pregnancy. Tightening up and strengthening the pelvic muscles will help with this problem. You can practice stopping your urination while you are going to the bathroom. These are the same muscles you need to strengthen. It is also the muscles you would use if you were trying to stop from passing gas. You can practice tightening these muscles up 10 times a set and repeating this about 3 times per day. Once you know what muscles to tighten up, do not perform these exercises during urination. It is more likely   to cause an infection by backing up the urine.   Ask for help if you have financial, counseling or nutritional needs during pregnancy. Your caregiver will be able to offer counseling for these needs as well as refer you for other special needs.   Make a list of emergency phone numbers and have them available.   Plan on getting help from family or friends when you go home from the hospital.   Make a trial run to the hospital.   Take prenatal classes with the father to understand, practice and ask questions about the labor and delivery.   Prepare the baby's room/nursery.   Do not travel out of the city unless it is absolutely necessary and with the advice of your caregiver.   Wear only low or no heal shoes to have better balance and prevent falling.  MEDICATIONS AND DRUG USE IN PREGNANCY  Take prenatal vitamins as directed. The vitamin should contain 1 milligram of folic acid. Keep all vitamins out of reach of children. Only a couple vitamins or tablets containing iron may be fatal  to a baby or young child when ingested.   Avoid use of all medications, including herbs, over-the-counter medications, not prescribed or suggested by your caregiver. Only take over-the-counter or prescription medicines for pain, discomfort, or fever as directed by your caregiver. Do not use aspirin, ibuprofen (Motrin, Advil, Nuprin) or naproxen (Aleve) unless OK'd by your caregiver.   Let your caregiver also know about herbs you may be using.   Alcohol is related to a number of birth defects. This includes fetal alcohol syndrome. All alcohol, in any form, should be avoided completely. Smoking will cause low birth rate and premature babies.   Street/illegal drugs are very harmful to the baby. They are absolutely forbidden. A baby born to an addicted mother will be addicted at birth. The baby will go through the same withdrawal an adult does.  SEEK MEDICAL CARE IF: You have any concerns or worries during your pregnancy. It is better to call with your questions if you feel they cannot wait, rather than worry about them. DECISIONS ABOUT CIRCUMCISION You may or may not know the sex of your baby. If you know your baby is a boy, it may be time to think about circumcision. Circumcision is the removal of the foreskin of the penis. This is the skin that covers the sensitive end of the penis. There is no proven medical need for this. Often this decision is made on what is popular at the time or based upon religious beliefs and social issues. You can discuss these issues with your caregiver or pediatrician. SEEK IMMEDIATE MEDICAL CARE IF:   An unexplained oral temperature above 102 F (38.9 C) develops, or as your caregiver suggests.   You have leaking of fluid from the vagina (birth canal). If leaking membranes are suspected, take your temperature and tell your caregiver of this when you call.   There is vaginal spotting, bleeding or passing clots. Tell your caregiver of the amount and how many pads are  used.   You develop a bad smelling vaginal discharge with a change in the color from clear to white.   You develop vomiting that lasts more than 24 hours.   You develop chills or fever.   You develop shortness of breath.   You develop burning on urination.   You loose more than 2 pounds of weight or gain more than 2 pounds of weight or as suggested by your   caregiver.   You notice sudden swelling of your face, hands, and feet or legs.   You develop belly (abdominal) pain. Round ligament discomfort is a common non-cancerous (benign) cause of abdominal pain in pregnancy. Your caregiver still must evaluate you.   You develop a severe headache that does not go away.   You develop visual problems, blurred or double vision.   If you have not felt your baby move for more than 1 hour. If you think the baby is not moving as much as usual, eat something with sugar in it and lie down on your left side for an hour. The baby should move at least 4 to 5 times per hour. Call right away if your baby moves less than that.   You fall, are in a car accident or any kind of trauma.   There is mental or physical violence at home.  Document Released: 11/10/2001 Document Revised: 11/05/2011 Document Reviewed: 05/15/2009 ExitCare Patient Information 2012 [employer]. 

## 2012-08-24 NOTE — Progress Notes (Signed)
Elevated 1 hr GTT, refused 3 hour GTT. Random CBG today 73. Denies HA, vision changes.  Hx 9 lb 6 oz baby. Sono at 30 weeks showed EFW 78%. Will get sono for EFW. Pt not sure she would want IOL but wants to see what sono shows. Flu shot today.

## 2012-08-24 NOTE — Progress Notes (Signed)
[id number]= , Used Radiation protection practitioner [place]

## 2012-08-25 ENCOUNTER — Telehealth: Payer: Self-pay | Admitting: *Deleted

## 2012-08-25 NOTE — Telephone Encounter (Signed)
Called pt w/[employer] interpreter # [phone]. I informed pt of the estimated weight of her baby and offered her the option of induction of labor.  Pt stated that she does not want to be induced because her baby is not due until 08/31/12. I explained that her baby is full term and the concern is that her baby is already large and will continue to grow which may make a vaginal delivery difficult. Pt again stated that she wanted to wait until her due date unless of course that she goes into labor on her own. I asked if she meant that she would like to be scheduled for induction on her due date. She said no, her last baby weighed 4.2 kilos and she will wait even longer if she has not had spontaneous labor by her due date. I stated that it is certainly her choice for induction appt, however we just want to be sure she is aware that her baby may be even larger than what was estimated from the ultrasound. Pt stated that she understands and does not want to have induction of labor. She will keep her follow up appt as scheduled on 08/31/12.

## 2012-08-25 NOTE — Telephone Encounter (Signed)
Message copied by Jill Side on Thu Aug 25, 2012  2:59 PM ------      Message from: FERRY, [place]      Created: Wed Aug 24, 2012  4:05 PM       Estimated fetal weight is almost 9 lbs and could be larger. Please let patient know and offer the option for an induction of labor. We discussed in clinic today. She does not speak English unfortunately.

## 2012-08-31 ENCOUNTER — Encounter: Payer: Self-pay | Admitting: Advanced Practice Midwife

## 2012-08-31 ENCOUNTER — Ambulatory Visit (INDEPENDENT_AMBULATORY_CARE_PROVIDER_SITE_OTHER): Payer: Medicaid Other | Admitting: Advanced Practice Midwife

## 2012-08-31 VITALS — BP 119/78 | Temp 98.5°F | Wt 227.3 lb

## 2012-08-31 DIAGNOSIS — Z3490 Encounter for supervision of normal pregnancy, unspecified, unspecified trimester: Secondary | ICD-10-CM

## 2012-08-31 DIAGNOSIS — O0930 Supervision of pregnancy with insufficient antenatal care, unspecified trimester: Secondary | ICD-10-CM

## 2012-08-31 LAB — POCT URINALYSIS DIP (DEVICE)
Glucose, UA: NEGATIVE mg/dL
Nitrite: NEGATIVE
Urobilinogen, UA: 0.2 mg/dL (ref 0.0–1.0)
pH: 6 (ref 5.0–8.0)

## 2012-08-31 NOTE — Progress Notes (Signed)
Pulse: 92

## 2012-08-31 NOTE — Patient Instructions (Signed)
Normal Labor and Delivery  Your caregiver must first be sure you are in labor. Signs of labor include:  · You may pass what is called "the mucus plug" before labor begins. This is a small amount of blood stained mucus.  · Regular uterine contractions.  · The time between contractions get closer together.  · The discomfort and pain gradually gets more intense.  · Pains are mostly located in the back.  · Pains get worse when walking.  · The cervix (the opening of the uterus becomes thinner (begins to efface) and opens up (dilates).  Once you are in labor and admitted into the hospital or care center, your caregiver will do the following:  · A complete physical examination.  · Check your vital signs (blood pressure, pulse, temperature and the fetal heart rate).  · Do a vaginal examination (using a sterile glove and lubricant) to determine:  · The position (presentation) of the baby (head [vertex] or buttock first).  · The level (station) of the baby's head in the birth canal.  · The effacement and dilatation of the cervix.  · You may have your pubic hair shaved and be given an enema depending on your caregiver and the circumstance.  · An electronic monitor is usually placed on your abdomen. The monitor follows the length and intensity of the contractions, as well as the baby's heart rate.  · Usually, your caregiver will insert an IV in your arm with a bottle of sugar water. This is done as a precaution so that medications can be given to you quickly during labor or delivery.  NORMAL LABOR AND DELIVERY IS DIVIDED UP INTO 3 STAGES:  First Stage  This is when regular contractions begin and the cervix begins to efface and dilate. This stage can last from 3 to 15 hours. The end of the first stage is when the cervix is 100% effaced and 10 centimeters dilated. Pain medications may be given by   · Injection (morphine, demerol, etc.)  · Regional anesthesia (spinal, caudal or epidural, anesthetics given in different locations of  the spine). Paracervical pain medication may be given, which is an injection of and anesthetic on each side of the cervix.  A pregnant woman may request to have "Natural Childbirth" which is not to have any medications or anesthesia during her labor and delivery.  Second Stage  This is when the baby comes down through the birth canal (vagina) and is born. This can take 1 to 4 hours. As the baby's head comes down through the birth canal, you may feel like you are going to have a bowel movement. You will get the urge to bear down and push until the baby is delivered. As the baby's head is being delivered, the caregiver will decide if an episiotomy (a cut in the perineum and vagina area) is needed to prevent tearing of the tissue in this area. The episiotomy is sewn up after the delivery of the baby and placenta. Sometimes a mask with nitrous oxide is given for the mother to breath during the delivery of the baby to help if there is too much pain. The end of Stage 2 is when the baby is fully delivered. Then when the umbilical cord stops pulsating it is clamped and cut.  Third Stage  The third stage begins after the baby is completely delivered and ends after the placenta (afterbirth) is delivered. This usually takes 5 to 30 minutes. After the placenta is delivered, a medication   is given either by intravenous or injection to help contract the uterus and prevent bleeding. The third stage is not painful and pain medication is usually not necessary. If an episiotomy was done, it is repaired at this time.  After the delivery, the mother is watched and monitored closely for 1 to 2 hours to make sure there is no postpartum bleeding (hemorrhage). If there is a lot of bleeding, medication is given to contract the uterus and stop the bleeding.  Document Released: 08/25/2008 Document Revised: 02/08/2012 Document Reviewed: 08/25/2008  ExitCare® Patient Information ©2013 [employer].

## 2012-08-31 NOTE — Progress Notes (Signed)
Doing well. Declines induction. EFW about 9.0 lb.  Pelvis proven to 9+6. Will need IOL next week if not delivered, may be willing to do that. Cervix favorable.

## 2012-09-03 ENCOUNTER — Inpatient Hospital Stay (HOSPITAL_COMMUNITY)
Admission: AD | Admit: 2012-09-03 | Discharge: 2012-09-06 | DRG: 775 | Disposition: A | Discharge status: Home / Self Care | Payer: Medicaid Other | Source: Ambulatory Visit | Attending: Obstetrics & Gynecology | Admitting: Obstetrics & Gynecology

## 2012-09-03 ENCOUNTER — Encounter (HOSPITAL_COMMUNITY): Payer: Self-pay | Admitting: Women's Health

## 2012-09-03 DIAGNOSIS — O99814 Abnormal glucose complicating childbirth: Secondary | ICD-10-CM | POA: Diagnosis present

## 2012-09-03 DIAGNOSIS — Z22330 Carrier of Group B streptococcus: Secondary | ICD-10-CM

## 2012-09-03 DIAGNOSIS — N90810 Female genital mutilation status, unspecified: Secondary | ICD-10-CM | POA: Diagnosis present

## 2012-09-03 DIAGNOSIS — O4290 Premature rupture of membranes, unspecified as to length of time between rupture and onset of labor, unspecified weeks of gestation: Secondary | ICD-10-CM | POA: Diagnosis present

## 2012-09-03 DIAGNOSIS — O99892 Other specified diseases and conditions complicating childbirth: Secondary | ICD-10-CM | POA: Diagnosis present

## 2012-09-03 LAB — CBC
HCT: 34.2 % — ABNORMAL LOW (ref 36.0–46.0)
Hemoglobin: 11.2 g/dL — ABNORMAL LOW (ref 12.0–15.0)
RDW: 15 % (ref 11.5–15.5)
WBC: 7.6 10*3/uL (ref 4.0–10.5)

## 2012-09-03 LAB — GLUCOSE, CAPILLARY: Glucose-Capillary: 70 mg/dL (ref 70–99)

## 2012-09-03 MED ORDER — OXYCODONE-ACETAMINOPHEN 5-325 MG PO TABS: 1.0000 | ORAL_TABLET | ORAL | Status: DC | PRN

## 2012-09-03 MED ORDER — FLEET ENEMA 7-19 GM/118ML RE ENEM: 1.0000 | ENEMA | RECTAL | Status: DC | PRN

## 2012-09-03 MED ORDER — OXYTOCIN BOLUS FROM INFUSION
500.0000 mL | Freq: Once | INTRAVENOUS | Status: AC
  Administered 2012-09-04: 500 mL via INTRAVENOUS
  Filled 2012-09-03: qty 500

## 2012-09-03 MED ORDER — ACETAMINOPHEN 325 MG PO TABS: 650.0000 mg | ORAL_TABLET | ORAL | Status: DC | PRN

## 2012-09-03 MED ORDER — LACTATED RINGERS IV SOLN
INTRAVENOUS | Status: DC
  Administered 2012-09-03 – 2012-09-04 (×2): via INTRAVENOUS

## 2012-09-03 MED ORDER — OXYTOCIN 40 UNITS IN LACTATED RINGERS INFUSION - SIMPLE MED
62.5000 mL/h | Freq: Once | INTRAVENOUS | Status: AC
  Administered 2012-09-04: 62.5 mL/h via INTRAVENOUS

## 2012-09-03 MED ORDER — LACTATED RINGERS IV SOLN: 500.0000 mL | INTRAVENOUS | Status: DC | PRN

## 2012-09-03 MED ORDER — ONDANSETRON HCL 4 MG/2ML IJ SOLN: 4.0000 mg | Freq: Four times a day (QID) | INTRAMUSCULAR | Status: DC | PRN

## 2012-09-03 MED ORDER — HYDROXYZINE HCL 50 MG PO TABS: 50.0000 mg | ORAL_TABLET | Freq: Four times a day (QID) | ORAL | Status: DC | PRN

## 2012-09-03 MED ORDER — NALBUPHINE SYRINGE 5 MG/0.5 ML
5.0000 mg | INJECTION | INTRAMUSCULAR | Status: DC | PRN
  Administered 2012-09-04: 5 mg via INTRAVENOUS
  Filled 2012-09-03: qty 0.5

## 2012-09-03 MED ORDER — PENICILLIN G POTASSIUM 5000000 UNITS IJ SOLR
5.0000 10*6.[IU] | Freq: Once | INTRAVENOUS | Status: AC
  Administered 2012-09-03: 5 10*6.[IU] via INTRAVENOUS
  Filled 2012-09-03: qty 5

## 2012-09-03 MED ORDER — LIDOCAINE HCL (PF) 1 % IJ SOLN
30.0000 mL | INTRAMUSCULAR | Status: DC | PRN
  Filled 2012-09-03: qty 30

## 2012-09-03 MED ORDER — DEXTROSE 5 % IV SOLN
2.5000 10*6.[IU] | INTRAVENOUS | Status: DC
  Administered 2012-09-04 (×3): 2.5 10*6.[IU] via INTRAVENOUS
  Filled 2012-09-03 (×5): qty 2.5

## 2012-09-03 MED ORDER — CITRIC ACID-SODIUM CITRATE 334-500 MG/5ML PO SOLN: 30.0000 mL | ORAL | Status: DC | PRN

## 2012-09-03 MED ORDER — HYDROXYZINE HCL 50 MG/ML IM SOLN
50.0000 mg | Freq: Four times a day (QID) | INTRAMUSCULAR | Status: DC | PRN
  Filled 2012-09-03: qty 1

## 2012-09-03 MED ORDER — IBUPROFEN 600 MG PO TABS: 600.0000 mg | ORAL_TABLET | Freq: Four times a day (QID) | ORAL | Status: DC | PRN

## 2012-09-03 NOTE — MAU Note (Signed)
Patient states via interpreter that ROM occurred at 1830, with a brownish-green tinge.

## 2012-09-03 NOTE — MAU Provider Note (Signed)
See H&P note  Cheral Marker, CNM, WHNP-BC

## 2012-09-03 NOTE — MAU Note (Signed)
[employer] interpreter called and patient does not want to use Ruth unless necessary.  Patient's interpreter has not returned her calls. There is an arabic visitor with another patient who started talking to patient and husband. Her name is Carolyn Ramsey, she is glad to stay to help with interpreting for a while. Patient denies use of Kennyth Lose and paperwork signed and patient desires Carolyn Ramsey to interpret for now.

## 2012-09-03 NOTE — H&P (Signed)
Carolyn Ramsey is a 36 y.o. G61P3003 [place] female at [redacted]w[redacted]d by LMP which correlates per pt with 1st trimester u/s performed in [place], presenting for report of SROM light MSF @ 1830, with mild irregular uc's.  Good FM.  Denies VB.  Denies ha, scotomata, ruq/epigastric pain, n/v.  Limited pnc- initiated care in [place], but in [place] x 2 months before initiating care at [facility] who was unable to obtain records from [place].  EFW >90% (8lb 15oz) on 9/25 u/s. Pelvis proven to 9lb 4.2oz.   Interpreter who was visiting another pt happened to be in MAU and pt gave written consent for this person to interpret for her- refused to use [employer] at this time.  Pt unable to reach her normal interpreter via phone.    Maternal Medical History:  Reason for admission: Reason for admission: rupture of membranes.  Contractions: Onset was 1-2 hours ago.   Frequency: irregular.   Perceived severity is mild.    Fetal activity: Perceived fetal activity is normal.   Last perceived fetal movement was within the past hour.    Prenatal complications: Possible GDM  Prenatal Complications - Diabetes: 1hr glucola 147, refused 3hr   OB History    Grav Para Term Preterm Abortions TAB SAB Ect Mult Living   4 3 3  0 0 0 0 0 0 3     Past Medical History  Diagnosis Date  . No pertinent past medical history    Past Surgical History  Procedure Date  . No past surgeries    Family History: family history is not on file. Social History:  reports that she has never smoked. She has never used smokeless tobacco. She reports that she does not drink alcohol or use illicit drugs.   Prenatal Transfer Tool  Maternal Diabetes: 1 hour glucola 147, refused 3hr Genetic Screening: Declined  Too late Maternal Ultrasounds/Referrals: Normal, efw >90% (8lb 15oz) on 9/25 Fetal Ultrasounds or other Referrals:  None Maternal Substance Abuse:  No Significant Maternal Medications:  None Significant Maternal Lab Results:  Lab values  include: Group B Strep positive Other Comments:  limited prenatal care- initiated care in [place], in [place] x 2 months before began care at [facility] @ 34.5  Review of Systems  Constitutional: Negative.   HENT: Negative.   Eyes: Negative.   Respiratory: Negative.   Cardiovascular: Negative.   Gastrointestinal: Negative.   Genitourinary: Negative.   Musculoskeletal: Negative.   Skin: Negative.   Neurological: Negative.   Endo/Heme/Allergies: Negative.   Psychiatric/Behavioral: Negative.       Blood pressure 128/96, pulse 97, temperature 98.4 F (36.9 C), temperature source Oral, resp. rate 20, last menstrual period 11/25/2011. Maternal Exam:  Uterine Assessment: Contraction strength is mild.  Contraction frequency is irregular.   Abdomen: Patient reports no abdominal tenderness. Estimated fetal weight is >90% (8lb 15oz) on 9/25.   Fetal presentation: vertex vtx confirmed by bedside u/s  Introitus: Normal vagina.  Ferning test: positive.  Nitrazine test: not done. Amniotic fluid character: meconium stained. Female circumcision  Pelvis: adequate for delivery.   Cervix: Cervix evaluated by digital exam.     Fetal Exam Fetal Monitor Review: Mode: ultrasound.   Baseline rate: 145.  Variability: moderate (6-25 bpm).   Pattern: accelerations present and no decelerations.    Fetal State Assessment: Category I - tracings are normal.     Physical Exam  Constitutional: She is oriented to person, place, and time. She appears well-developed and well-nourished.  HENT:  Head: Normocephalic.  Neck: Normal range of motion.  Cardiovascular: Normal rate and regular rhythm.   Respiratory: Effort normal and breath sounds normal.  GI: Soft.       gravid  Genitourinary: Uterus normal.       Female circumcision Fern slide obtained by rn from fluid on perineum. Noted to be + by myself. SVE: tight 3/50/-3 to -4, unable to determine presenting part by exam.  BS u/s confirms vtx    Musculoskeletal: Normal range of motion. She exhibits no edema.  Neurological: She is alert and oriented to person, place, and time. She has normal reflexes.  Skin: Skin is warm and dry.  Psychiatric: She has a normal mood and affect. Her behavior is normal. Judgment and thought content normal.    Prenatal labs: ABO, Rh: O/POS/-- (08/26 1158) Antibody:   pending Rubella: 185.5 (08/26 1158) RPR: NON REAC (08/26 1158)  HBsAg: NEGATIVE (08/26 1158)  HIV:   pending GBS: Positive (09/11 0000)   Assessment/Plan: A:  [redacted]w[redacted]d SIUP  PROM Lt MSF @ 1830  Cat I FHR  GBS pos  Poss GDM, failed 1hr glucola and refused 3hr  LGA, EFW >90% on 9/25  Limited prenatal care  Female circumcision  P:  Admit to BS  PCN per protocol for GBS+  Check random CBG upon admission  Antibody screen & rapid HIV ordered  Expectant management- will initiate pitocin @ 0500 if needed  Anticipate NSVD  Plans to breastfeed, desires depo for contraception, desires circumcision- unsure about IP vs OP  Reviewed pt & poc w/ Dr. Glee Arvin, Cheron Every 09/03/2012, 8:13 PM

## 2012-09-03 NOTE — MAU Note (Signed)
Carolyn Ramsey, interpreter has left. [employer] used for IV start and admission info.

## 2012-09-04 ENCOUNTER — Encounter (HOSPITAL_COMMUNITY): Payer: Self-pay | Admitting: *Deleted

## 2012-09-04 DIAGNOSIS — O4290 Premature rupture of membranes, unspecified as to length of time between rupture and onset of labor, unspecified weeks of gestation: Secondary | ICD-10-CM

## 2012-09-04 DIAGNOSIS — O99814 Abnormal glucose complicating childbirth: Secondary | ICD-10-CM

## 2012-09-04 DIAGNOSIS — N90810 Female genital mutilation status, unspecified: Secondary | ICD-10-CM

## 2012-09-04 LAB — RPR: RPR Ser Ql: NONREACTIVE

## 2012-09-04 MED ORDER — SENNOSIDES-DOCUSATE SODIUM 8.6-50 MG PO TABS
2.0000 | ORAL_TABLET | Freq: Every day | ORAL | Status: DC
  Administered 2012-09-04 – 2012-09-05 (×2): 2 via ORAL

## 2012-09-04 MED ORDER — TERBUTALINE SULFATE 1 MG/ML IJ SOLN: 0.2500 mg | Freq: Once | INTRAMUSCULAR | Status: DC | PRN

## 2012-09-04 MED ORDER — ONDANSETRON HCL 4 MG/2ML IJ SOLN: 4.0000 mg | INTRAMUSCULAR | Status: DC | PRN

## 2012-09-04 MED ORDER — IBUPROFEN 600 MG PO TABS
600.0000 mg | ORAL_TABLET | Freq: Four times a day (QID) | ORAL | Status: DC
  Administered 2012-09-04 – 2012-09-06 (×7): 600 mg via ORAL
  Filled 2012-09-04 (×8): qty 1

## 2012-09-04 MED ORDER — LANOLIN HYDROUS EX OINT: TOPICAL_OINTMENT | CUTANEOUS | Status: DC | PRN

## 2012-09-04 MED ORDER — BENZOCAINE-MENTHOL 20-0.5 % EX AERO
1.0000 "application " | INHALATION_SPRAY | CUTANEOUS | Status: DC | PRN
  Filled 2012-09-04: qty 56

## 2012-09-04 MED ORDER — DIBUCAINE 1 % RE OINT: 1.0000 "application " | TOPICAL_OINTMENT | RECTAL | Status: DC | PRN

## 2012-09-04 MED ORDER — WITCH HAZEL-GLYCERIN EX PADS: 1.0000 "application " | MEDICATED_PAD | CUTANEOUS | Status: DC | PRN

## 2012-09-04 MED ORDER — OXYCODONE-ACETAMINOPHEN 5-325 MG PO TABS
1.0000 | ORAL_TABLET | ORAL | Status: DC | PRN
  Administered 2012-09-04 – 2012-09-06 (×6): 1 via ORAL
  Filled 2012-09-04 (×4): qty 1
  Filled 2012-09-04: qty 2
  Filled 2012-09-04 (×2): qty 1

## 2012-09-04 MED ORDER — SIMETHICONE 80 MG PO CHEW: 80.0000 mg | CHEWABLE_TABLET | ORAL | Status: DC | PRN

## 2012-09-04 MED ORDER — OXYTOCIN 40 UNITS IN LACTATED RINGERS INFUSION - SIMPLE MED
1.0000 m[IU]/min | INTRAVENOUS | Status: DC
Start: 2012-09-04 — End: 2012-09-04
  Administered 2012-09-04: 2 m[IU]/min via INTRAVENOUS
  Filled 2012-09-04: qty 1000

## 2012-09-04 MED ORDER — TETANUS-DIPHTH-ACELL PERTUSSIS 5-2.5-18.5 LF-MCG/0.5 IM SUSP
0.5000 mL | Freq: Once | INTRAMUSCULAR | Status: AC
  Administered 2012-09-06: 0.5 mL via INTRAMUSCULAR
  Filled 2012-09-04: qty 0.5

## 2012-09-04 MED ORDER — PRENATAL MULTIVITAMIN CH
1.0000 | ORAL_TABLET | Freq: Every day | ORAL | Status: DC
  Administered 2012-09-05 – 2012-09-06 (×2): 1 via ORAL
  Filled 2012-09-04 (×2): qty 1

## 2012-09-04 MED ORDER — ZOLPIDEM TARTRATE 5 MG PO TABS: 5.0000 mg | ORAL_TABLET | Freq: Every evening | ORAL | Status: DC | PRN

## 2012-09-04 MED ORDER — DIPHENHYDRAMINE HCL 25 MG PO CAPS: 25.0000 mg | ORAL_CAPSULE | Freq: Four times a day (QID) | ORAL | Status: DC | PRN

## 2012-09-04 MED ORDER — ONDANSETRON HCL 4 MG PO TABS: 4.0000 mg | ORAL_TABLET | ORAL | Status: DC | PRN

## 2012-09-04 NOTE — Progress Notes (Signed)
Faculty Practice notified pt 8 cm with urge to push

## 2012-09-04 NOTE — Progress Notes (Signed)
Head crowning. Emergency light activated.  Assistance arrived within seconds

## 2012-09-04 NOTE — Progress Notes (Signed)
Fabian November CNM in room.  [facility] CNM paged and Dr Despina Hidden paged

## 2012-09-04 NOTE — Progress Notes (Signed)
Carolyn Ramsey CNM stepped out of room

## 2012-09-04 NOTE — Progress Notes (Signed)
Carolyn Ramsey is a 36 y.o. G4P3003 at [redacted]w[redacted]d admitted for PROM Lt MSF @ 1830  Subjective: Comfortable, no complaints  Objective: BP 134/87  Pulse 110  Temp 98.4 F (36.9 Carolyn) (Oral)  Resp 18  Ht 5\' 7"  (1.702 m)  Wt 102.967 kg (227 lb)  BMI 35.55 kg/m2  LMP 11/25/2011      FHT:  FHR: 130 bpm, variability: moderate,  accelerations:  Present,  decelerations:  Absent UC:   regular, every 5-6 minutes SVE:   Dilation: 3 Effacement (%): 50 Station: -3 Exam by:: Carolyn Penley RN  Labs: Lab Results  Component Value Date   WBC 7.6 09/03/2012   HGB 11.2* 09/03/2012   HCT 34.2* 09/03/2012   MCV 83.6 09/03/2012   PLT 219 09/03/2012    Assessment / Plan: IOL d/t PROM, pitocin just increased to 73mu/min per RN, to continue increasing per protocol to acheive adequate uc pattern  Labor: IOL process Preeclampsia:  n/a Fetal Wellbeing:  Category I Pain Control:  n/a I/D:  n/a Anticipated MOD:  NSVD  Carolyn Ramsey 09/04/2012, 6:10 AM

## 2012-09-04 NOTE — Progress Notes (Signed)
Pt breastfeeding with arm bent-will recheck after feeding.

## 2012-09-04 NOTE — MAU Provider Note (Signed)
Attestation of Attending Supervision of Advanced Practitioner (CNM/NP): Evaluation and management procedures were performed by the Advanced Practitioner under my supervision and collaboration.  I have reviewed the Advanced Practitioner's note and chart, and I agree with the management and plan.  HARRAWAY-SMITH, Idonna Heeren 8:32 AM     

## 2012-09-04 NOTE — Progress Notes (Signed)
Carolyn Ramsey CNM in room

## 2012-09-04 NOTE — Progress Notes (Signed)
Pt has urge to push

## 2012-09-04 NOTE — H&P (Signed)
Attestation of Attending Supervision of Advanced Practitioner (CNM/NP): Evaluation and management procedures were performed by the Advanced Practitioner under my supervision and collaboration.  I have reviewed the Advanced Practitioner's note and chart, and I agree with the management and plan.  HARRAWAY-SMITH, Letita Prentiss 8:32 AM     

## 2012-09-05 NOTE — Progress Notes (Signed)
UR chart review completed.  

## 2012-09-05 NOTE — Discharge Summary (Signed)
Agree with above note.  Carolyn Jefferys H. 09/05/2012 3:15 PM

## 2012-09-05 NOTE — Discharge Summary (Signed)
Obstetric Discharge Summary Reason for Admission: onset of labor Prenatal Procedures: none Intrapartum Procedures: spontaneous vaginal delivery and GBS prophylaxis Postpartum Procedures: none Patient's nurse reports that she had a 2nd degree laceration that was sutured.  Complications-Operative and Postpartum: none  Subjective:  (History and Physical obtained with [employer] interpreter on phone) Patient reports that she is doing well with some abdominal pain. She reports no excessive bleeding. She is ambulating, eating/drinking, voiding, and +flatus. She has not had a BM yet. Breast feeding is going well.  She is ready to go home today. She originally planned to have the Depo shot for birth control but is now considering and IUD. She plans to have the baby boy circumcision done as an OP.   Hemoglobin  Date Value Range Status  09/03/2012 11.2* 12.0 - 15.0 g/dL Final     HCT  Date Value Range Status  09/03/2012 34.2* 36.0 - 46.0 % Final    Physical Exam:  General: alert, cooperative and no distress Cardio: normal rate Pulm: normal rate, no respiratory distress.  Lochia: appropriate Uterine Fundus: firm DVT Evaluation: No evidence of DVT seen on physical exam. Negative Homan's sign. No cords or calf tenderness. No significant calf/ankle edema.  Discharge Diagnoses: Term Pregnancy-delivered  Discharge Information: Date: 09/05/2012 Activity: pelvic rest Diet: routine Medications: Ibuprofen and Colace Condition: stable Instructions: refer to practice specific booklet Discharge to: home     Newborn Data: Live born female  Birth Weight: 8 lb 4.3 oz (3751 g) APGAR: 4, 9  Home with mother.  Winfield Cunas 09/05/2012, 9:08 AM  Saw pt and agree with PA note except baby not discharged (jaundice) so will hold mother's discharge until tomorrow. FF at umbilicus.  Danae Orleans, CNM 09/05/2012 10:39 AM

## 2012-09-06 NOTE — Discharge Summary (Signed)
Obstetric Discharge Summary Reason for Admission: onset of labor Prenatal Procedures: none Intrapartum Procedures: spontaneous vaginal delivery Postpartum Procedures: none Complications-Operative and Postpartum: none Hemoglobin  Date Value Range Status  09/03/2012 11.2* 12.0 - 15.0 g/dL Final     HCT  Date Value Range Status  09/03/2012 34.2* 36.0 - 46.0 % Final    Physical Exam:  General: alert, cooperative and no distress Lochia: appropriate Uterine Fundus: firm   Discharge Diagnoses: Term Pregnancy-delivered  Discharge Information: Date: 09/06/2012 Activity: unrestricted Diet: routine Medications: None Condition: improved Instructions: refer to practice specific booklet Discharge to: home Follow-up Information    Follow up with [facility]. In 6 weeks.   Contact information:   [address] [place] [address] [phone]         Newborn Data: Live born female  Birth Weight: 8 lb 4.3 oz (3751 g) APGAR: 4, 9  Home with mother.  Tawnya Crook 09/06/2012, 6:51 AM

## 2012-09-06 NOTE — Discharge Summary (Signed)
Agree with above note.  Carolyn Alwin H. 09/06/2012 8:34 PM

## 2012-09-06 NOTE — Clinical Social Work Maternal (Signed)
Clinical Social Work Department PSYCHOSOCIAL ASSESSMENT - MATERNAL/CHILD 09/06/2012  Patient:  Kemnitz,Octavia  Account Number:  [id number]  Admit Date:  09/03/2012  Childs Name:   Fraaj Seliman   Clinical Social Worker:  Susanne Baumgarner, LCSW   Date/Time:  09/06/2012 10:30 AM  Date Referred:  09/06/2012   Referral source [facility]    Referred reason [facility]  Other referral source:    I:  FAMILY / HOME ENVIRONMENT Child's legal guardian:  PARENT  Guardian - Name Guardian - Age Guardian - Address Shena Balan [address] [place] [address] [place] Sel[place]  [address] [place] [address]  Other household support members/support persons Other support:   Other children, school age and toddler   II  PSYCHOSOCIAL DATA Information Source:  Patient Interview  Financial and Community Resources Employment:   Financial resources:  Medicaid If Medicaid - County:  [place]  School / Grade:   Maternity Care Coordinator / Child Services Coordination / Early Interventions:  Cultural issues impacting care:   Limited/No English   III  STRENGTHS Strengths Supportive family/friends Home prepared for Child (including basic supplies)  Strength comment:    IV  RISK FACTORS AND CURRENT PROBLEMS Current Problem:  None   Risk Factor & Current Problem Patient Issue Family Issue Risk Factor / Current Problem Comment  N N    V  SOCIAL WORK ASSESSMENT Sw Intern, Candis Kabel, LCSW, and Interpreter visited with patient. interpreter introduced. Sw intern advised we were there to make sure she had everything she needed and to answer any questions. MOB asked about medicaid immediately. Sw intern advised we could contact financial counselor and they would be able to check on status. Cannie Muckle then left room to check. Sw intern and interpreter continued, MOB then asked about social security card, advised would be mailed to them shortly but may be slow because of  government shut down, advised she should receive temporary birth certificate from hospital before leaving, MOB confirmed she had received that. MOB stated she moved to [place] about 3 months ago with the assistance of African services. she stated that she does like it here but has no other family here. MOB states she has supplies needed. MOB states she has transportation issues when her husband is at work but can call African services if needed. FOB and other visitors came into the room, sw intern asked if it was ok to speak in front of them, MOB explained yes, it was FOB. MOB states she did not start prenatal care until she was linked with African services at about 34 weeks. sw intern advised of hospital policy for drug screen and that urine was negative. Patient denies any use of illegal drugs, FOB states she only has water, no caffeine or anything else. Kwamaine Cuppett came back in and advised that mother has medicaid so baby would have medicaid, still pending because of recent birth but instructed to go to pediatric appointments even though they will not have the baby's Medicaid card for 30-45 days.  MOB states she has a doctors appointment on Thursday and will go to appointment. MOB had no further questions.  Assessment and documentation by Carrie Coleman, MSW Intern.  Signed off by Adley Mazurowski, LCSW.     VI SOCIAL WORK PLAN Social Work Plan No Further Intervention Required / No Barriers to Discharge  Type of pt/family education:   Educated on hospital policy for drug screen due to [facility]  If child protective   services report - county:   If child protective services report - date:   Information/referral to community resources comment:   None identified.  Other social work plan:   Per CN, meconium was not collected. 

## 2012-09-07 ENCOUNTER — Encounter: Payer: Medicaid Other | Admitting: Obstetrics and Gynecology

## 2012-09-07 LAB — TYPE AND SCREEN: Antibody Screen: NEGATIVE

## 2012-10-03 ENCOUNTER — Ambulatory Visit: Payer: Medicaid Other | Admitting: Family Medicine

## 2012-11-14 ENCOUNTER — Encounter: Payer: Self-pay | Admitting: Family

## 2012-11-14 ENCOUNTER — Ambulatory Visit: Payer: Medicaid Other | Admitting: Family

## 2012-11-14 VITALS — BP 168/98 | HR 114 | Temp 96.7°F | Ht 67.0 in | Wt 207.2 lb

## 2012-11-14 DIAGNOSIS — O10919 Unspecified pre-existing hypertension complicating pregnancy, unspecified trimester: Secondary | ICD-10-CM | POA: Insufficient documentation

## 2012-11-14 DIAGNOSIS — I10 Essential (primary) hypertension: Secondary | ICD-10-CM

## 2012-11-14 MED ORDER — HYDROCHLOROTHIAZIDE 25 MG PO TABS: 25.0000 mg | ORAL_TABLET | Freq: Every day | ORAL | Status: DC

## 2012-11-14 MED ORDER — DOCUSATE SODIUM 100 MG PO CAPS: 100.0000 mg | ORAL_CAPSULE | Freq: Two times a day (BID) | ORAL | Status: DC

## 2012-11-14 NOTE — Progress Notes (Unsigned)
  Subjective:     Carolyn Ramsey is a 36 y.o. female who presents for a postpartum visit. She is 9 weeks postpartum following a spontaneous vaginal delivery. I have fully reviewed the prenatal and intrapartum course. The delivery was at 40wk3d gestational weeks. Outcome: spontaneous vaginal delivery.  Postpartum course has been good. Baby's course has been good. Baby is feeding by breast. Bleeding no bleeding. Bowel function is abnormal: constipation, BM once every 2 days. Bladder function is normal. Patient is sexually active. Contraception method is IUD and to be scheduled in next 2 weeks for IUD placement. Postpartum depression screening: negative.    Review of Systems Pertinent items are noted in HPI.   Objective:    BP 168/98  Pulse 114  Temp 96.7 F (35.9 C) (Oral)  Ht 5\' 7"  (1.702 m)  Wt 93.985 kg (207 lb 3.2 oz)  BMI 32.45 kg/m2  Breastfeeding? Yes BP recheck 158/96 General:  alert, cooperative and no distress   Breasts:  Not examined  Lungs: clear to auscultation bilaterally  Heart:  regular rate and rhythm, S1, S2 normal, no murmur, click, rub or gallop  Abdomen: soft, non-tender; bowel sounds normal; no masses,  no organomegaly   Vulva:  normal  Vagina: normal vagina  Cervix:  Not examined  Corpus: not examined  Adnexa:  not evaluated  Rectal Exam: Not performed.        Assessment:    Normal postpartum exam. Pap smear not done at today's visit.   Plan:    1. Contraception: none; Pt will return in two weeks for IUD. 2. Elevated blood pressure.  RX HCTZ 25 mg QD.  Pt declines HCTZ even after explained risks; agrees to recheck in two weeks.   3. Follow up in: 2 weeks or as needed.

## 2012-11-25 ENCOUNTER — Ambulatory Visit (INDEPENDENT_AMBULATORY_CARE_PROVIDER_SITE_OTHER): Payer: Medicaid Other | Admitting: Family Medicine

## 2012-11-25 ENCOUNTER — Ambulatory Visit: Payer: Medicaid Other | Admitting: Family Medicine

## 2012-11-25 ENCOUNTER — Encounter: Payer: Self-pay | Admitting: Family Medicine

## 2012-11-25 VITALS — BP 158/99 | HR 107 | Temp 96.7°F | Ht 68.0 in | Wt 208.1 lb

## 2012-11-25 DIAGNOSIS — Z30430 Encounter for insertion of intrauterine contraceptive device: Secondary | ICD-10-CM

## 2012-11-25 DIAGNOSIS — I10 Essential (primary) hypertension: Secondary | ICD-10-CM

## 2012-11-25 MED ORDER — LEVONORGESTREL 20 MCG/24HR IU IUD
INTRAUTERINE_SYSTEM | Freq: Once | INTRAUTERINE | Status: AC
  Administered 2012-11-25: 1 via INTRAUTERINE

## 2012-11-25 MED ORDER — AMLODIPINE BESYLATE 5 MG PO TABS: 5.0000 mg | ORAL_TABLET | Freq: Every day | ORAL | Status: DC

## 2012-11-25 NOTE — Patient Instructions (Addendum)
Make an appointment as soon as possible with your primary care doctor to evaluate your high blood pressure.  Levonorgestrel intrauterine device (IUD) What is this medicine? LEVONORGESTREL IUD (LEE voe nor jes trel) is a contraceptive (birth control) device. The device is placed inside the uterus by a healthcare professional. It is used to prevent pregnancy and can also be used to treat heavy bleeding that occurs during your period. Depending on the device, it can be used for 3 to 5 years. This medicine may be used for other purposes; ask your health care provider or pharmacist if you have questions. What should I tell my health care provider before I take this medicine? They need to know if you have any of these conditions: -abnormal Pap smear -cancer of the breast, uterus, or cervix -diabetes -endometritis -genital or pelvic infection now or in the past -have more than one sexual partner or your partner has more than one partner -heart disease -history of an ectopic or tubal pregnancy -immune system problems -IUD in place -liver disease or tumor -problems with blood clots or take blood-thinners -use intravenous drugs -uterus of unusual shape -vaginal bleeding that has not been explained -an unusual or allergic reaction to levonorgestrel, other hormones, silicone, or polyethylene, medicines, foods, dyes, or preservatives -pregnant or trying to get pregnant -breast-feeding How should I use this medicine? This device is placed inside the uterus by a health care professional. Talk to your pediatrician regarding the use of this medicine in children. Special care may be needed. Overdosage: If you think you have taken too much of this medicine contact a poison control center or emergency room at once. NOTE: This medicine is only for you. Do not share this medicine with others. What if I miss a dose? This does not apply. What may interact with this medicine? Do not take this medicine with  any of the following medications: -amprenavir -bosentan -fosamprenavir This medicine may also interact with the following medications: -aprepitant -barbiturate medicines for inducing sleep or treating seizures -bexarotene -griseofulvin -medicines to treat seizures like carbamazepine, ethotoin, felbamate, oxcarbazepine, phenytoin, topiramate -modafinil -pioglitazone -rifabutin -rifampin -rifapentine -some medicines to treat HIV infection like atazanavir, indinavir, lopinavir, nelfinavir, tipranavir, ritonavir -St. John's wort -warfarin This list may not describe all possible interactions. Give your health care provider a list of all the medicines, herbs, non-prescription drugs, or dietary supplements you use. Also tell them if you smoke, drink alcohol, or use illegal drugs. Some items may interact with your medicine. What should I watch for while using this medicine? Visit your doctor or health care professional for regular check ups. See your doctor if you or your partner has sexual contact with others, becomes HIV positive, or gets a sexual transmitted disease. This product does not protect you against HIV infection (AIDS) or other sexually transmitted diseases. You can check the placement of the IUD yourself by reaching up to the top of your vagina with clean fingers to feel the threads. Do not pull on the threads. It is a good habit to check placement after each menstrual period. Call your doctor right away if you feel more of the IUD than just the threads or if you cannot feel the threads at all. The IUD may come out by itself. You may become pregnant if the device comes out. If you notice that the IUD has come out use a backup birth control method like condoms and call your health care provider. Using tampons will not change the position of the  IUD and are okay to use during your period. What side effects may I notice from receiving this medicine? Side effects that you should report to  your doctor or health care professional as soon as possible: -allergic reactions like skin rash, itching or hives, swelling of the face, lips, or tongue -fever, flu-like symptoms -genital sores -high blood pressure -no menstrual period for 6 weeks during use -pain, swelling, warmth in the leg -pelvic pain or tenderness -severe or sudden headache -signs of pregnancy -stomach cramping -sudden shortness of breath -trouble with balance, talking, or walking -unusual vaginal bleeding, discharge -yellowing of the eyes or skin Side effects that usually do not require medical attention (report to your doctor or health care professional if they continue or are bothersome): -acne -breast pain -change in sex drive or performance -changes in weight -cramping, dizziness, or faintness while the device is being inserted -headache -irregular menstrual bleeding within first 3 to 6 months of use -nausea This list may not describe all possible side effects. Call your doctor for medical advice about side effects. You may report side effects to FDA at 1-800-FDA-1088. Where should I keep my medicine? This does not apply. NOTE: This sheet is a summary. It may not cover all possible information. If you have questions about this medicine, talk to your doctor, pharmacist, or health care provider.  2013, [employer]. (12/17/2011 1:54:04 PM)  Intrauterine Device Insertion Care After Refer to this sheet in the next few weeks. These instructions provide you with information on caring for yourself after your procedure. Your caregiver may also give you more specific instructions. Your treatment has been planned according to current medical practices, but problems sometimes occur. Call your caregiver if you have any problems or questions after your procedure. HOME CARE INSTRUCTIONS   Only take over-the-counter or prescription medicines for pain, discomfort, or fever as directed by your caregiver. Do not  use aspirin. This may increase bleeding.  Check your IUD to make sure it is in place before you resume sexual activity. You should be able to feel the strings. If you cannot feel the strings, something may be wrong. The IUD may have fallen out of the uterus, or the uterus may have been punctured (perforated) during placement. Also, if the strings are getting longer, it may mean that the IUD is being forced out of the uterus. You no longer have full protection from pregnancy if any of these problems occur.  You may resume sexual intercourse if you are not having problems with the IUD. The IUD is considered immediately effective.  You may resume normal activities.  Keep all follow-up appointments to be sure your IUD has remained in place. After the first exam, yearly exams are advised, unless you cannot feel the strings of your IUD.  Continue to check that the IUD is still in place by feeling for the strings after every menstrual period. SEEK MEDICAL CARE IF:   You have bleeding that is heavier or lasts longer than a normal menstrual cycle.  You have a fever.  You have increasing cramps or abdominal pain not relieved with medicine.  You have abdominal pain that does not seem to be related to the same area of earlier cramping and pain.  You are lightheaded, unusually weak, or faint.  You have abnormal vaginal discharge or smells.  You have pain during sexual intercourse.  You cannot feel the IUD strings, or the IUD string has gotten longer.  You feel the IUD at the opening  of the cervix in the vagina.  You think you are pregnant, or you miss your menstrual period.  The IUD string is hurting your sex partner. Document Released: 07/15/2011 Document Revised: 02/08/2012 Document Reviewed: 07/15/2011 [facility] Patient Information 2013 [place], [place].  Hypertension As your heart beats, it forces blood through your arteries. This force is your blood pressure. If the pressure is too high,  it is called hypertension (HTN) or high blood pressure. HTN is dangerous because you may have it and not know it. High blood pressure may mean that your heart has to work harder to pump blood. Your arteries may be narrow or stiff. The extra work puts you at risk for heart disease, stroke, and other problems.  Blood pressure consists of two numbers, a higher number over a lower, 110/72, for example. It is stated as "110 over 72." The ideal is below 120 for the top number (systolic) and under 80 for the bottom (diastolic). Write down your blood pressure today. You should pay close attention to your blood pressure if you have certain conditions such as:  Heart failure.  Prior heart attack.  Diabetes  Chronic kidney disease.  Prior stroke.  Multiple risk factors for heart disease. To see if you have HTN, your blood pressure should be measured while you are seated with your arm held at the level of the heart. It should be measured at least twice. A one-time elevated blood pressure reading (especially in the Emergency Department) does not mean that you need treatment. There may be conditions in which the blood pressure is different between your right and left arms. It is important to see your caregiver soon for a recheck. Most people have essential hypertension which means that there is not a specific cause. This type of high blood pressure may be lowered by changing lifestyle factors such as:  Stress.  Smoking.  Lack of exercise.  Excessive weight.  Drug/tobacco/alcohol use.  Eating less salt. Most people do not have symptoms from high blood pressure until it has caused damage to the body. Effective treatment can often prevent, delay or reduce that damage. TREATMENT  When a cause has been identified, treatment for high blood pressure is directed at the cause. There are a large number of medications to treat HTN. These fall into several categories, and your caregiver will help you select the  medicines that are best for you. Medications may have side effects. You should review side effects with your caregiver. If your blood pressure stays high after you have made lifestyle changes or started on medicines,   Your medication(s) may need to be changed.  Other problems may need to be addressed.  Be certain you understand your prescriptions, and know how and when to take your medicine.  Be sure to follow up with your caregiver within the time frame advised (usually within two weeks) to have your blood pressure rechecked and to review your medications.  If you are taking more than one medicine to lower your blood pressure, make sure you know how and at what times they should be taken. Taking two medicines at the same time can result in blood pressure that is too low. SEEK IMMEDIATE MEDICAL CARE IF:  You develop a severe headache, blurred or changing vision, or confusion.  You have unusual weakness or numbness, or a faint feeling.  You have severe chest or abdominal pain, vomiting, or breathing problems. MAKE SURE YOU:   Understand these instructions.  Will watch your condition.  Will  get help right away if you are not doing well or get worse. Document Released: 11/16/2005 Document Revised: 02/08/2012 Document Reviewed: 07/06/2008 [facility] Patient Information 2013 [place], [place].

## 2012-11-25 NOTE — Progress Notes (Signed)
Patient ID: Carolyn Ramsey, female   DOB: May 21, 1976, 36 y.o.   MRN: [id number]  S:   Carolyn Ramsey is a 36 y.o. [phone] here for Mirena IUD insertion. No GYN concerns.  Last pap smear was on 07/25/12 and was normal. Baby born September 04, 2010, SVD. Has not resumed menses since delivery. No vaginal discharge, pain, dysuria.   Patient also asking about high blood pressure. Given Rx for prescription at post-partum visit for HCTZ but has not picked up or taken. She denies headache, vision changes, chest pain, palpitations or dyspnea. Denies any history of HTN in the past or family members with HTN. She sees Dr. Mayford Knife in [facility] as PCP and last saw him in October. He did not mention high blood pressure.   O:   Filed Vitals:   11/25/12 0957  BP: 158/99  Pulse: 107  Temp: 96.7 F (35.9 C)   Gen: alert, no distress Abdomen:  Non-tender. GU: Normal vagina and cervix, no CMT, no discharge.  Extrem:  No edema  IUD Procedure Note Patient identified, informed consent performed.  Discussed risks of irregular bleeding, cramping, infection, malpositioning or misplacement of the IUD outside the uterus which may require further procedures. Time out was performed.  Urine pregnancy test negative.  Speculum placed in the vagina.  Cervix visualized.  Cleaned with Betadine x 2.  Grasped anteriorly with a single tooth tenaculum.  Uterus sounded to 7.5 cm.  Mirena IUD placed per manufacturer's recommendations.  Strings trimmed to 2.5 cm. Tenaculum was removed, silver nitrate used with good hemostasis noted.  Patient tolerated procedure well.   Patient was given post-procedure instructions.  Patient was also asked to check IUD strings periodically and follow up in 4 weeks for IUD check.   A/P 36 y.o. [id number] at 2.5 months postpartum -  Contraception:  IUD placed without difficulty -  HTN:  Pt does not want to take HCTZ because she does not want to have increased urinary frequency.  Rx for Norvasc 5 mg sent  to pharmacy. Patient plans to call her PCP and follow up next week. - F/U 4 weeks for string check.  Napoleon Form, MD 11/25/2012 12:17 PM

## 2012-12-23 ENCOUNTER — Ambulatory Visit: Payer: Medicaid Other | Admitting: Medical

## 2013-01-06 ENCOUNTER — Ambulatory Visit: Payer: Medicaid Other | Admitting: Medical

## 2013-01-13 ENCOUNTER — Ambulatory Visit: Payer: Medicaid Other | Admitting: Medical

## 2013-01-23 ENCOUNTER — Encounter: Payer: Self-pay | Admitting: Medical

## 2013-02-03 ENCOUNTER — Ambulatory Visit: Payer: Medicaid Other | Admitting: Medical

## 2013-02-23 ENCOUNTER — Ambulatory Visit (INDEPENDENT_AMBULATORY_CARE_PROVIDER_SITE_OTHER): Payer: Medicaid Other | Admitting: Medical

## 2013-02-23 ENCOUNTER — Encounter: Payer: Self-pay | Admitting: Medical

## 2013-02-23 VITALS — BP 144/93 | HR 86 | Ht 68.0 in | Wt 207.3 lb

## 2013-02-23 DIAGNOSIS — I10 Essential (primary) hypertension: Secondary | ICD-10-CM

## 2013-02-23 DIAGNOSIS — Z30431 Encounter for routine checking of intrauterine contraceptive device: Secondary | ICD-10-CM

## 2013-02-23 NOTE — Patient Instructions (Signed)

## 2013-02-23 NOTE — Progress Notes (Signed)
Patient ID: [name], female   DOB: [birth year], 37 y.o.   MRN: [id number]  Ms. Carolyn Ramsey is a 37 y.o. [id number] who presents to clinic for a string check 4 weeks post IUD insertion. She is not having any pain. Her bleeding stopped around 02/08/13. She denies abnormal discharge or any other concerns at this time.   BP 144/93  Pulse 86  Ht 5\' 8"  (1.727 m)  Wt 207 lb 4.8 oz (94.031 kg)  BMI 31.53 kg/m2  Breastfeeding? Yes GENERAL: Well-developed, well-nourished female in no acute distress.  HEENT: Normocephalic, atraumatic.Marland Kitchen  LUNGS: Clear to auscultation bilaterally.  HEART: Regular rate and rhythm. ABDOMEN: Soft, nontender, nondistended. No organomegaly. PELVIC: Normal external female genitalia. Vagina is pink and rugated.  Normal discharge. Normal cervix contour. IUD string visible at cervical os and appropriate length.  EXTREMITIES: No cyanosis, clubbing, or edema.   A: IUD, in appropriate place  P: Patient to return 06/2013 for annual exam or sooner PRN  Freddi Starr, PA-C 02/23/2013 1:30 PM

## 2013-07-24 ENCOUNTER — Ambulatory Visit: Payer: No Typology Code available for payment source | Attending: Family Medicine | Admitting: Internal Medicine

## 2013-07-24 VITALS — BP 144/94 | HR 87 | Temp 98.0°F | Resp 16 | Wt 211.4 lb

## 2013-07-24 DIAGNOSIS — I10 Essential (primary) hypertension: Secondary | ICD-10-CM | POA: Insufficient documentation

## 2013-07-24 LAB — CBC WITH DIFFERENTIAL/PLATELET
Basophils Absolute: 0.1 10*3/uL (ref 0.0–0.1)
Eosinophils Relative: 3 % (ref 0–5)
Lymphocytes Relative: 41 % (ref 12–46)
Lymphs Abs: 3.4 10*3/uL (ref 0.7–4.0)
Neutro Abs: 4.1 10*3/uL (ref 1.7–7.7)
Neutrophils Relative %: 49 % (ref 43–77)
Platelets: 327 10*3/uL (ref 150–400)
RBC: 4.7 MIL/uL (ref 3.87–5.11)
RDW: 15.6 % — ABNORMAL HIGH (ref 11.5–15.5)
WBC: 8.3 10*3/uL (ref 4.0–10.5)

## 2013-07-24 MED ORDER — ATENOLOL 50 MG PO TABS: 50.0000 mg | ORAL_TABLET | Freq: Every day | ORAL | Status: DC

## 2013-07-24 MED ORDER — HYDROCHLOROTHIAZIDE 25 MG PO TABS: 25.0000 mg | ORAL_TABLET | Freq: Every day | ORAL | Status: DC

## 2013-07-24 NOTE — Progress Notes (Signed)
Patient ID: Carolyn Ramsey, female   DOB: 04-12-1976, 37 y.o.   MRN: [id number]  CC:  HPI: 37 year old female with history of hypertension for which he takes atenolol and hydrochlorothiazide who presents to the ER for establishing care. She denies any chest pain any shortness of breath. She states that she has not taken any of her medications for the last 2 days because she ran out She also complaining of pain in both her legs, occasionally complains of burning sensation in her feet She has not tried anything over-the-counter for the pain    No Known Allergies Past Medical History  Diagnosis Date  . No pertinent past medical history   . Hypertension    Current Outpatient Prescriptions on File Prior to Visit  Medication Sig Dispense Refill  . docusate sodium (COLACE) 100 MG capsule Take 1 capsule (100 mg total) by mouth 2 (two) times daily.  10 capsule  0  . Prenatal Vit-Fe Fumarate-FA (PRENATAL MULTIVITAMIN) TABS Take 1 tablet by mouth every morning.       No current facility-administered medications on file prior to visit.   History reviewed. No pertinent family history. History   Social History  . Marital Status: Married    Spouse Name: N/A    Number of Children: N/A  . Years of Education: N/A   Occupational History  . Not on file.   Social History Main Topics  . Smoking status: Never Smoker   . Smokeless tobacco: Never Used  . Alcohol Use: No  . Drug Use: No  . Sexual Activity: Yes    Birth Control/ Protection: IUD   Other Topics Concern  . Not on file   Social History Narrative  . No narrative on file    Review of Systems  Constitutional: Negative for fever, chills, diaphoresis, activity change, appetite change and fatigue.  HENT: Negative for ear pain, nosebleeds, congestion, facial swelling, rhinorrhea, neck pain, neck stiffness and ear discharge.   Eyes: Negative for pain, discharge, redness, itching and visual disturbance.  Respiratory: Negative for cough,  choking, chest tightness, shortness of breath, wheezing and stridor.   Cardiovascular: Negative for chest pain, palpitations and leg swelling.  Gastrointestinal: Negative for abdominal distention.  Genitourinary: Negative for dysuria, urgency, frequency, hematuria, flank pain, decreased urine volume, difficulty urinating and dyspareunia.  Musculoskeletal: Negative for back pain, joint swelling, arthralgias and gait problem.  Neurological: Negative for dizziness, tremors, seizures, syncope, facial asymmetry, speech difficulty, weakness, light-headedness, numbness and headaches.  Hematological: Negative for adenopathy. Does not bruise/bleed easily.  Psychiatric/Behavioral: Negative for hallucinations, behavioral problems, confusion, dysphoric mood, decreased concentration and agitation.    Objective:   Filed Vitals:   07/24/13 1120  BP: 144/94  Pulse: 87  Temp: 98 F (36.7 C)  Resp: 16    Physical Exam  Constitutional: Appears well-developed and well-nourished. No distress.  HENT: Normocephalic. External right and left ear normal. Oropharynx is clear and moist.  Eyes: Conjunctivae and EOM are normal. PERRLA, no scleral icterus.  Neck: Normal ROM. Neck supple. No JVD. No tracheal deviation. No thyromegaly.  CVS: RRR, S1/S2 +, no murmurs, no gallops, no carotid bruit.  Pulmonary: Effort and breath sounds normal, no stridor, rhonchi, wheezes, rales.  Abdominal: Soft. BS +,  no distension, tenderness, rebound or guarding.  Musculoskeletal: Normal range of motion. No edema and no tenderness.  Lymphadenopathy: No lymphadenopathy noted, cervical, inguinal. Neuro: Alert. Normal reflexes, muscle tone coordination. No cranial nerve deficit. Skin: Skin is warm and dry. No rash noted. Not  diaphoretic. No erythema. No pallor.  Psychiatric: Normal mood and affect. Behavior, judgment, thought content normal.   Lab Results  Component Value Date   WBC 7.6 09/03/2012   HGB 11.2* 09/03/2012   HCT  34.2* 09/03/2012   MCV 83.6 09/03/2012   PLT 219 09/03/2012   No results found for this basename: CREATININE, BUN, NA, K, CL, CO2    No results found for this basename: HGBA1C   Lipid Panel  No results found for this basename: chol, trig, hdl, cholhdl, vldl, ldlcalc       Assessment and plan:   Patient Active Problem List   Diagnosis Date Noted  . Encounter for IUD insertion 11/25/2012  . Hypertension 11/14/2012  . 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 GBS (group B Streptococcus carrier), +RV culture, currently pregnant 08/16/2012  . Late prenatal care complicating pregnancy 07/25/2012   Hypertension Refill atenolol and hydrochlorothiazide Will obtain baseline labs including CMP Patient will need to follow up in a month      The patient was given clear instructions to go to ER or return to medical center if symptoms don't improve, worsen or new problems develop. The patient verbalized understanding. The patient was told to call to get any lab results if not heard anything in the next week.

## 2013-07-24 NOTE — Progress Notes (Signed)
Patient is here to establish care Takes medication for HTN

## 2013-07-25 LAB — COMPREHENSIVE METABOLIC PANEL
ALT: 9 U/L (ref 0–35)
Albumin: 4.7 g/dL (ref 3.5–5.2)
CO2: 24 mEq/L (ref 19–32)
Calcium: 9.6 mg/dL (ref 8.4–10.5)
Chloride: 109 mEq/L (ref 96–112)
Glucose, Bld: 82 mg/dL (ref 70–99)
Sodium: 146 mEq/L — ABNORMAL HIGH (ref 135–145)
Total Bilirubin: 0.4 mg/dL (ref 0.3–1.2)
Total Protein: 8 g/dL (ref 6.0–8.3)

## 2013-07-25 LAB — LIPID PANEL
Cholesterol: 186 mg/dL (ref 0–200)
Triglycerides: 64 mg/dL (ref <150)
VLDL: 13 mg/dL (ref 0–40)

## 2013-07-25 LAB — HEMOGLOBIN A1C: Hgb A1c MFr Bld: 6.3 % — ABNORMAL HIGH (ref <5.7)

## 2013-08-25 ENCOUNTER — Ambulatory Visit: Payer: No Typology Code available for payment source | Attending: Family Medicine | Admitting: Internal Medicine

## 2013-08-25 ENCOUNTER — Ambulatory Visit: Payer: No Typology Code available for payment source

## 2013-08-25 ENCOUNTER — Encounter: Payer: Self-pay | Admitting: Internal Medicine

## 2013-08-25 VITALS — BP 126/84 | HR 81 | Temp 98.2°F | Resp 16 | Wt 212.0 lb

## 2013-08-25 DIAGNOSIS — I10 Essential (primary) hypertension: Secondary | ICD-10-CM | POA: Insufficient documentation

## 2013-08-25 DIAGNOSIS — M7989 Other specified soft tissue disorders: Secondary | ICD-10-CM | POA: Insufficient documentation

## 2013-08-25 MED ORDER — HYDROCHLOROTHIAZIDE 25 MG PO TABS: 25.0000 mg | ORAL_TABLET | Freq: Every day | ORAL | Status: DC

## 2013-08-25 NOTE — Progress Notes (Signed)
Patient ID: Carolyn Ramsey, female   DOB: 05-Feb-1976, 37 y.o.   MRN: [id number]  CC: Followup  HPI: 37 year old female with past medical history of hypertension who presents to clinic for followup. Patient reports having pain in both legs. She feels as if there is heat inside. She reports occasional pain in the calf muscles. She has no problems ambulating. No numbness and/or tingling sensation. No falls. No joint swelling or rash. No chest pain or palpitations. No shortness of breath.  No Known Allergies Past Medical History  Diagnosis Date  . No pertinent past medical history   . Hypertension    Current Outpatient Prescriptions on File Prior to Visit  Medication Sig Dispense Refill  . atenolol (TENORMIN) 50 MG tablet Take 1 tablet (50 mg total) by mouth daily.  30 tablet  6  . docusate sodium (COLACE) 100 MG capsule Take 1 capsule (100 mg total) by mouth 2 (two) times daily.  10 capsule  0  . Prenatal Vit-Fe Fumarate-FA (PRENATAL MULTIVITAMIN) TABS Take 1 tablet by mouth every morning.       No current facility-administered medications on file prior to visit.   Family medical history significant for HTN, HLD  History   Social History  . Marital Status: Married    Spouse Name: N/A    Number of Children: N/A  . Years of Education: N/A   Occupational History  . Not on file.   Social History Main Topics  . Smoking status: Never Smoker   . Smokeless tobacco: Never Used  . Alcohol Use: No  . Drug Use: No  . Sexual Activity: Yes    Birth Control/ Protection: IUD   Other Topics Concern  . Not on file   Social History Narrative  . No narrative on file    Review of Systems  Constitutional: Negative for fever, chills, diaphoresis, activity change, appetite change and fatigue.  HENT: Negative for ear pain, nosebleeds, congestion, facial swelling, rhinorrhea, neck pain, neck stiffness and ear discharge.   Eyes: Negative for pain, discharge, redness, itching and visual  disturbance.  Respiratory: Negative for cough, choking, chest tightness, shortness of breath, wheezing and stridor.   Cardiovascular: Negative for chest pain, palpitations and leg swelling.  Gastrointestinal: Negative for abdominal distention.  Genitourinary: Negative for dysuria, urgency, frequency, hematuria, flank pain, decreased urine volume, difficulty urinating and dyspareunia.  Musculoskeletal: Negative for back pain, joint swelling, arthralgias and gait problem. Positive for lower extremity pain and occasional swelling  Neurological: Negative for dizziness, tremors, seizures, syncope, facial asymmetry, speech difficulty, weakness, light-headedness, numbness and headaches.  Hematological: Negative for adenopathy. Does not bruise/bleed easily.  Psychiatric/Behavioral: Negative for hallucinations, behavioral problems, confusion, dysphoric mood, decreased concentration and agitation.    Objective:   Filed Vitals:   08/25/13 1104  BP: 126/84  Pulse: 81  Temp: 98.2 F (36.8 C)  Resp: 16    Physical Exam  Constitutional: Appears well-developed and well-nourished. No distress.  HENT: Normocephalic. External right and left ear normal. Oropharynx is clear and moist.  Eyes: Conjunctivae and EOM are normal. PERRLA, no scleral icterus.  Neck: Normal ROM. Neck supple. No JVD. No tracheal deviation. No thyromegaly.  CVS: RRR, S1/S2 +, no murmurs, no gallops, no carotid bruit.  Pulmonary: Effort and breath sounds normal, no stridor, rhonchi, wheezes, rales.  Abdominal: Soft. BS +,  no distension, tenderness, rebound or guarding.  Musculoskeletal: Normal range of motion. No edema and no tenderness.  Lymphadenopathy: No lymphadenopathy noted, cervical, inguinal. Neuro: Alert. Normal reflexes,  muscle tone coordination. No cranial nerve deficit. Skin: Skin is warm and dry. No rash noted. Not diaphoretic. No erythema. No pallor.  Psychiatric: Normal mood and affect. Behavior, judgment, thought  content normal.   Lab Results  Component Value Date   WBC 8.3 07/24/2013   HGB 12.2 07/24/2013   HCT 37.3 07/24/2013   MCV 79.4 07/24/2013   PLT 327 07/24/2013   Lab Results  Component Value Date   CREATININE 0.49* 07/24/2013   BUN 16 07/24/2013   NA 146* 07/24/2013   K 3.7 07/24/2013   CL 109 07/24/2013   CO2 24 07/24/2013    Lab Results  Component Value Date   HGBA1C 6.3* 07/24/2013   Lipid Panel     Component Value Date/Time   CHOL 186 07/24/2013 1208   TRIG 64 07/24/2013 1208   HDL 62 07/24/2013 1208   CHOLHDL 3.0 07/24/2013 1208   VLDL 13 07/24/2013 1208   LDLCALC 111* 07/24/2013 1208       Assessment and plan:   Patient Active Problem List   Diagnosis Date Noted  . Hypertension 11/14/2012    Priority: Medium - We have discussed target BP range - I have advised pt to check BP regularly and to call us back if the numbers are higher than 140/90 - discussed the importance of compliance with medical therapy and diet  - continue Hctz and atenolol  . Bilateral LE swelling/pain - obtain lower extremity doppler - pt breastfeeding and does not want pain meds 08/17/2012

## 2013-08-25 NOTE — Patient Instructions (Addendum)

## 2013-08-25 NOTE — Progress Notes (Signed)
Via Arabic interpreter... Pt is here for a f/u for bilateral feet pain Voices no new other concerns... Alert w/no signs of acute distress.

## 2013-08-30 ENCOUNTER — Ambulatory Visit (HOSPITAL_COMMUNITY)
Admission: RE | Admit: 2013-08-30 | Discharge: 2013-08-30 | Disposition: A | Discharge status: Home / Self Care | Payer: No Typology Code available for payment source | Source: Ambulatory Visit | Attending: Internal Medicine | Admitting: Internal Medicine

## 2013-08-30 DIAGNOSIS — M79609 Pain in unspecified limb: Secondary | ICD-10-CM | POA: Insufficient documentation

## 2013-08-30 DIAGNOSIS — I10 Essential (primary) hypertension: Secondary | ICD-10-CM

## 2013-08-30 NOTE — Progress Notes (Signed)
Bilateral lower extremity venous duplex:  No evidence of DVT, superficial thrombosis, or Baker's Cyst.   

## 2013-10-23 ENCOUNTER — Ambulatory Visit: Payer: No Typology Code available for payment source | Admitting: Internal Medicine

## 2013-10-24 ENCOUNTER — Ambulatory Visit: Payer: No Typology Code available for payment source

## 2013-10-24 ENCOUNTER — Ambulatory Visit: Payer: No Typology Code available for payment source | Attending: Internal Medicine | Admitting: Internal Medicine

## 2013-10-24 ENCOUNTER — Encounter: Payer: Self-pay | Admitting: Internal Medicine

## 2013-10-24 VITALS — BP 138/86 | HR 102 | Temp 98.2°F | Resp 16 | Ht 68.0 in | Wt 212.0 lb

## 2013-10-24 DIAGNOSIS — H9201 Otalgia, right ear: Secondary | ICD-10-CM

## 2013-10-24 DIAGNOSIS — J029 Acute pharyngitis, unspecified: Secondary | ICD-10-CM | POA: Insufficient documentation

## 2013-10-24 DIAGNOSIS — J020 Streptococcal pharyngitis: Secondary | ICD-10-CM

## 2013-10-24 DIAGNOSIS — H9209 Otalgia, unspecified ear: Secondary | ICD-10-CM

## 2013-10-24 DIAGNOSIS — I10 Essential (primary) hypertension: Secondary | ICD-10-CM

## 2013-10-24 MED ORDER — IBUPROFEN 600 MG PO TABS: 600.0000 mg | ORAL_TABLET | Freq: Three times a day (TID) | ORAL | Status: DC | PRN

## 2013-10-24 MED ORDER — AMOXICILLIN-POT CLAVULANATE 875-125 MG PO TABS: 1.0000 | ORAL_TABLET | Freq: Two times a day (BID) | ORAL | Status: DC

## 2013-10-24 MED ORDER — HYDROCHLOROTHIAZIDE 25 MG PO TABS: 25.0000 mg | ORAL_TABLET | Freq: Every day | ORAL | Status: DC

## 2013-10-24 NOTE — Progress Notes (Signed)
Patient is here for sore throat States has had fever and been in bed past 5 days Also needs medication refill

## 2013-10-24 NOTE — Progress Notes (Signed)
Patient ID: Carolyn Ramsey, female   DOB: 08-08-1976, 37 y.o.   MRN: [id number] MRN: [id number] Name: Carolyn Ramsey  Sex: female Age: 37 y.o. DOB: November 21, 1976  Allergies: Review of patient's allergies indicates no known allergies.  Chief Complaint  Patient presents with   Sore Throat    HPI: Patient is 37 y.o. female who comes today reported to have sore throat for the last few days, also reported to have right ear pain, nasal congestion and occasional cough nonproductive, reported to have some fever and chills denies any currently denies any chest pain or shortness of breath does not smoke cigarettes. Patient is also requesting refill on her blood pressure medication.  Past Medical History  Diagnosis Date   No pertinent past medical history    Hypertension     Past Surgical History  Procedure Laterality Date   No past surgeries        Medication List       This list is accurate as of: 10/24/13 11:29 AM.  Always use your most recent med list.               amoxicillin-clavulanate 875-125 MG per tablet  Commonly known as:  AUGMENTIN  Take 1 tablet by mouth 2 (two) times daily.     atenolol 50 MG tablet  Commonly known as:  TENORMIN  Take 1 tablet (50 mg total) by mouth daily.     docusate sodium 100 MG capsule  Commonly known as:  COLACE  Take 1 capsule (100 mg total) by mouth 2 (two) times daily.     hydrochlorothiazide 25 MG tablet  Commonly known as:  HYDRODIURIL  Take 1 tablet (25 mg total) by mouth daily.     ibuprofen 600 MG tablet  Commonly known as:  ADVIL,MOTRIN  Take 1 tablet (600 mg total) by mouth every 8 (eight) hours as needed.     prenatal multivitamin Tabs tablet  Take 1 tablet by mouth every morning.        Meds ordered this encounter  Medications   hydrochlorothiazide (HYDRODIURIL) 25 MG tablet    Sig: Take 1 tablet (25 mg total) by mouth daily.    Dispense:  30 tablet    Refill:  3   amoxicillin-clavulanate (AUGMENTIN) 875-125  MG per tablet    Sig: Take 1 tablet by mouth 2 (two) times daily.    Dispense:  20 tablet    Refill:  0   ibuprofen (ADVIL,MOTRIN) 600 MG tablet    Sig: Take 1 tablet (600 mg total) by mouth every 8 (eight) hours as needed.    Dispense:  30 tablet    Refill:  1    Immunization History  Administered Date(s) Administered   Influenza Split 08/24/2012   Tdap 09/06/2012    History  Substance Use Topics   Smoking status: Never Smoker    Smokeless tobacco: Never Used   Alcohol Use: No    Review of Systems  As noted in HPI  Filed Vitals:   10/24/13 1105  BP: 138/86  Pulse: 102  Temp: 98.2 F (36.8 C)  Resp: 16    Physical Exam  Physical Exam  HENT:  Pharyngeal erythema , enlarged tonsills no exudate, right TM congested . Nasal congestion   Eyes: EOM are normal. Pupils are equal, round, and reactive to light.  Cardiovascular: Normal rate and regular rhythm.   Pulmonary/Chest: Breath sounds normal. No respiratory distress. She has no wheezes.    CBC    Component  Value Date/Time   WBC 8.3 07/24/2013 1208   RBC 4.70 07/24/2013 1208   HGB 12.2 07/24/2013 1208   HCT 37.3 07/24/2013 1208   PLT 327 07/24/2013 1208   MCV 79.4 07/24/2013 1208   LYMPHSABS 3.4 07/24/2013 1208   MONOABS 0.5 07/24/2013 1208   EOSABS 0.2 07/24/2013 1208   BASOSABS 0.1 07/24/2013 1208    CMP     Component Value Date/Time   NA 146* 07/24/2013 1208   K 3.7 07/24/2013 1208   CL 109 07/24/2013 1208   CO2 24 07/24/2013 1208   GLUCOSE 82 07/24/2013 1208   BUN 16 07/24/2013 1208   CREATININE 0.49* 07/24/2013 1208   CALCIUM 9.6 07/24/2013 1208   PROT 8.0 07/24/2013 1208   ALBUMIN 4.7 07/24/2013 1208   AST 15 07/24/2013 1208   ALT 9 07/24/2013 1208   ALKPHOS 72 07/24/2013 1208   BILITOT 0.4 07/24/2013 1208    Lab Results  Component Value Date/Time   CHOL 186 07/24/2013 12:08 PM    No components found with this basename: hga1c    Lab Results  Component Value Date/Time   AST 15 07/24/2013 12:08 PM     Assessment and Plan  Pharyngitis - rapid strep test is negative but she has  Symptoms of pharyngitis, Plan: amoxicillin-clavulanate (AUGMENTIN) 875-125 MG per tablet  Hypertension - Plan: hydrochlorothiazide (HYDRODIURIL) 25 MG tablet  Otalgia, right - Plan: ibuprofen (ADVIL,MOTRIN) 600 MG tablet   Followup in 2 months.   Doris Cheadle, MD

## 2013-12-27 ENCOUNTER — Ambulatory Visit: Payer: No Typology Code available for payment source | Attending: Internal Medicine | Admitting: Internal Medicine

## 2013-12-27 ENCOUNTER — Encounter: Payer: Self-pay | Admitting: Internal Medicine

## 2013-12-27 VITALS — BP 128/87 | HR 97 | Temp 98.1°F | Resp 16 | Ht 68.0 in | Wt 220.0 lb

## 2013-12-27 DIAGNOSIS — I10 Essential (primary) hypertension: Secondary | ICD-10-CM

## 2013-12-27 MED ORDER — HYDROCHLOROTHIAZIDE 25 MG PO TABS: 25.0000 mg | ORAL_TABLET | Freq: Every day | ORAL | Status: DC

## 2013-12-27 NOTE — Progress Notes (Signed)
MRN: [id number] Name: Carolyn Ramsey  Sex: female Age: 38 y.o. DOB: 1976/02/23  Allergies: Review of patient's allergies indicates no known allergies.  Chief Complaint  Patient presents with  . Follow-up    HPI: Patient is 38 y.o. female who history of hypertension comes today for followup, has been taking hydrochlorothiazide and her milligram daily, she has been compliant with her medications denies any acute symptoms denies any headache dizziness chest and shortness of breath, blood pressure is well controlled.patient is requesting refill on the medications.  Past Medical History  Diagnosis Date  . No pertinent past medical history   . Hypertension     Past Surgical History  Procedure Laterality Date  . No past surgeries        Medication List       This list is accurate as of: 12/27/13 11:06 AM.  Always use your most recent med list.               amoxicillin-clavulanate 875-125 MG per tablet  Commonly known as:  AUGMENTIN  Take 1 tablet by mouth 2 (two) times daily.     atenolol 50 MG tablet  Commonly known as:  TENORMIN  Take 1 tablet (50 mg total) by mouth daily.     docusate sodium 100 MG capsule  Commonly known as:  COLACE  Take 1 capsule (100 mg total) by mouth 2 (two) times daily.     hydrochlorothiazide 25 MG tablet  Commonly known as:  HYDRODIURIL  Take 1 tablet (25 mg total) by mouth daily.     ibuprofen 600 MG tablet  Commonly known as:  ADVIL,MOTRIN  Take 1 tablet (600 mg total) by mouth every 8 (eight) hours as needed.     prenatal multivitamin Tabs tablet  Take 1 tablet by mouth every morning.        Meds ordered this encounter  Medications  . hydrochlorothiazide (HYDRODIURIL) 25 MG tablet    Sig: Take 1 tablet (25 mg total) by mouth daily.    Dispense:  30 tablet    Refill:  3    Immunization History  Administered Date(s) Administered  . Influenza Split 08/24/2012  . Tdap 09/06/2012    History reviewed. No pertinent family  history.  History  Substance Use Topics  . Smoking status: Never Smoker   . Smokeless tobacco: Never Used  . Alcohol Use: No    Review of Systems  As noted in HPI  Filed Vitals:   12/27/13 1035  BP: 128/87  Pulse: 97  Temp: 98.1 F (36.7 C)  Resp: 16    Physical Exam  Physical Exam  Constitutional: No distress.  Eyes: EOM are normal. Pupils are equal, round, and reactive to light.  Cardiovascular: Normal rate and regular rhythm.   Pulmonary/Chest: Breath sounds normal. No respiratory distress. She has no wheezes. She has no rales.  Musculoskeletal: She exhibits no edema.    CBC    Component Value Date/Time   WBC 8.3 07/24/2013 1208   RBC 4.70 07/24/2013 1208   HGB 12.2 07/24/2013 1208   HCT 37.3 07/24/2013 1208   PLT 327 07/24/2013 1208   MCV 79.4 07/24/2013 1208   LYMPHSABS 3.4 07/24/2013 1208   MONOABS 0.5 07/24/2013 1208   EOSABS 0.2 07/24/2013 1208   BASOSABS 0.1 07/24/2013 1208    CMP     Component Value Date/Time   NA 146* 07/24/2013 1208   K 3.7 07/24/2013 1208   CL 109 07/24/2013 1208   CO2 24  07/24/2013 1208   GLUCOSE 82 07/24/2013 1208   BUN 16 07/24/2013 1208   CREATININE 0.49* 07/24/2013 1208   CALCIUM 9.6 07/24/2013 1208   PROT 8.0 07/24/2013 1208   ALBUMIN 4.7 07/24/2013 1208   AST 15 07/24/2013 1208   ALT 9 07/24/2013 1208   ALKPHOS 72 07/24/2013 1208   BILITOT 0.4 07/24/2013 1208    Lab Results  Component Value Date/Time   CHOL 186 07/24/2013 12:08 PM    No components found with this basename: hga1c    Lab Results  Component Value Date/Time   AST 15 07/24/2013 12:08 PM    Assessment and Plan  Hypertension - Plan: continue with low salt diet and hydrochlorothiazide (HYDRODIURIL) 25 MG tablet, COMPLETE METABOLIC PANEL WITH GFR   Return in about 3 months (around 03/27/2014).  Doris CheadleADVANI, Saesha Llerenas, MD

## 2013-12-27 NOTE — Progress Notes (Signed)
Pt is here following up on her HTN Pt is needing a medication refill. Pt has an interpretor today.

## 2013-12-28 LAB — COMPLETE METABOLIC PANEL WITH GFR
ALT: 10 U/L (ref 0–35)
AST: 15 U/L (ref 0–37)
Albumin: 4.3 g/dL (ref 3.5–5.2)
Alkaline Phosphatase: 67 U/L (ref 39–117)
BILIRUBIN TOTAL: 0.3 mg/dL (ref 0.2–1.2)
BUN: 12 mg/dL (ref 6–23)
CO2: 27 meq/L (ref 19–32)
CREATININE: 0.44 mg/dL — AB (ref 0.50–1.10)
Calcium: 9.6 mg/dL (ref 8.4–10.5)
Chloride: 103 mEq/L (ref 96–112)
GFR, Est Non African American: >89 mL/min
Glucose, Bld: 93 mg/dL (ref 70–99)
Potassium: 3.5 mEq/L (ref 3.5–5.3)
Sodium: 139 mEq/L (ref 135–145)
Total Protein: 7.8 g/dL (ref 6.0–8.3)

## 2014-01-18 ENCOUNTER — Ambulatory Visit: Payer: No Typology Code available for payment source | Attending: Internal Medicine

## 2014-01-27 ENCOUNTER — Encounter (HOSPITAL_COMMUNITY): Payer: Self-pay | Admitting: Emergency Medicine

## 2014-01-27 ENCOUNTER — Emergency Department (INDEPENDENT_AMBULATORY_CARE_PROVIDER_SITE_OTHER)
Admission: EM | Admit: 2014-01-27 | Discharge: 2014-01-27 | Disposition: A | Discharge status: Home / Self Care | Payer: No Typology Code available for payment source | Source: Home / Self Care | Attending: Emergency Medicine | Admitting: Emergency Medicine

## 2014-01-27 DIAGNOSIS — J029 Acute pharyngitis, unspecified: Secondary | ICD-10-CM

## 2014-01-27 LAB — POCT RAPID STREP A: Streptococcus, Group A Screen (Direct): NEGATIVE

## 2014-01-27 MED ORDER — AMOXICILLIN 875 MG PO TABS: 875.0000 mg | ORAL_TABLET | Freq: Two times a day (BID) | ORAL | Status: DC

## 2014-01-27 NOTE — Discharge Instructions (Signed)
Finish all of the medicine.  If you are not getting better, please see your own doctor.    Pharyngitis Pharyngitis is a sore throat (pharynx). There is redness, pain, and swelling of your throat. HOME CARE   Drink enough fluids to keep your pee (urine) clear or pale yellow.  Only take medicine as told by your doctor.  You may get sick again if you do not take medicine as told. Finish your medicines, even if you start to feel better.  Do not take aspirin.  Rest.  Rinse your mouth (gargle) with salt water ( tsp of salt per 1 qt of water) every 1 2 hours. This will help the pain.  If you are not at risk for choking, you can suck on hard candy or sore throat lozenges. GET HELP IF:  You have large, tender lumps on your neck.  You have a rash.  You cough up green, yellow-brown, or bloody spit. GET HELP RIGHT AWAY IF:   You have a stiff neck.  You drool or cannot swallow liquids.  You throw up (vomit) or are not able to keep medicine or liquids down.  You have very bad pain that does not go away with medicine.  You have problems breathing (not from a stuffy nose). MAKE SURE YOU:   Understand these instructions.  Will watch your condition.  Will get help right away if you are not doing well or get worse. Document Released: 05/04/2008 Document Revised: 09/06/2013 Document Reviewed: 07/24/2013 [facility] Patient Information 2014 [place] [place].

## 2014-01-27 NOTE — ED Provider Notes (Signed)
CSN: [id number]     Arrival date & time 01/27/14  0957 History   First MD Initiated Contact with Patient 01/27/14 1202     Chief Complaint  Patient presents with  . Hypertension   (Consider location/radiation/quality/duration/timing/severity/associated sxs/prior Treatment) HPI Comments: Pt here because had headache yesterday; went to pharmacy and checked bp; it was 139/101. Pt here because bp elevated.  Takes bp med every day. Also c/o sore throat, mostly on L side, for 2 days.   Patient is a 38 y.o. female presenting with pharyngitis. The history is provided by the patient. The history is limited by a language barrier. A language interpreter was used [employer]).  Sore Throat This is a new problem. The current episode started 2 days ago. The problem occurs constantly. The problem has not changed since onset.Associated symptoms include headaches. The symptoms are aggravated by swallowing. Nothing relieves the symptoms. She has tried nothing for the symptoms.    Past Medical History  Diagnosis Date  . No pertinent past medical history   . Hypertension    Past Surgical History  Procedure Laterality Date  . No past surgeries     History reviewed. No pertinent family history. History  Substance Use Topics  . Smoking status: Never Smoker   . Smokeless tobacco: Never Used  . Alcohol Use: No   OB History   Grav Para Term Preterm Abortions TAB SAB Ect Mult Living   4 4 4  0 0 0 0 0 0 4     Review of Systems  Constitutional: Negative for fever.  HENT: Positive for sore throat. Negative for congestion.   Respiratory: Negative for cough.   Neurological: Positive for headaches.       Headache yesterday, none today    Allergies  Review of patient's allergies indicates no known allergies.  Home Medications   Current Outpatient Rx  Name  Route  Sig  Dispense  Refill  . amoxicillin (AMOXIL) 875 MG tablet   Oral   Take 1 tablet (875 mg total) by mouth 2 (two) times  daily.   20 tablet   0   . amoxicillin-clavulanate (AUGMENTIN) 875-125 MG per tablet   Oral   Take 1 tablet by mouth 2 (two) times daily.   20 tablet   0   . atenolol (TENORMIN) 50 MG tablet   Oral   Take 1 tablet (50 mg total) by mouth daily.   30 tablet   6   . docusate sodium (COLACE) 100 MG capsule   Oral   Take 1 capsule (100 mg total) by mouth 2 (two) times daily.   10 capsule   0   . hydrochlorothiazide (HYDRODIURIL) 25 MG tablet   Oral   Take 1 tablet (25 mg total) by mouth daily.   30 tablet   3   . ibuprofen (ADVIL,MOTRIN) 600 MG tablet   Oral   Take 1 tablet (600 mg total) by mouth every 8 (eight) hours as needed.   30 tablet   1   . Prenatal Vit-Fe Fumarate-FA (PRENATAL MULTIVITAMIN) TABS   Oral   Take 1 tablet by mouth every morning.          BP 130/86  Pulse 100  Temp(Src) 98.1 F (36.7 C) (Oral)  Resp 18  SpO2 100% Physical Exam  Constitutional: She appears well-developed and well-nourished. No distress.  HENT:  Right Ear: Tympanic membrane, external ear and ear canal normal.  Left Ear: Tympanic membrane, external ear and ear  canal normal.  Mouth/Throat: Oropharyngeal exudate, posterior oropharyngeal edema and posterior oropharyngeal erythema present. No tonsillar abscesses.  Tonsil more enlarged and with more exudate on L side compared to R.   Cardiovascular: Normal rate and regular rhythm.   Pulmonary/Chest: Effort normal and breath sounds normal.  Lymphadenopathy:       Head (right side): No submental, no submandibular and no tonsillar adenopathy present.       Head (left side): Submandibular and tonsillar adenopathy present. No submental adenopathy present.    She has no cervical adenopathy.    ED Course  Procedures (including critical care time) Labs Review Labs Reviewed  CULTURE, GROUP A STREP  POCT RAPID STREP A ([facility] CARE ONLY)   Imaging Review No results found.   MDM   1. Pharyngitis   concern for bacterial  pharyngitis despite nl strep screen. Rx amoxicillin 875mg  BId #[id number]. Pt bp stable here in [facility]. Pt to continue bp meds daily.      Cathlyn ParsonsAngela M Kalie Cabral, NP 01/27/14 [phone]

## 2014-01-27 NOTE — ED Provider Notes (Signed)
Medical screening examination/treatment/procedure(s) were performed by non-physician practitioner and as supervising physician I was immediately available for consultation/collaboration.  Muhamed Luecke, M.D.  Kmya Placide C Celena Lanius, MD 01/27/14 2028 

## 2014-01-27 NOTE — ED Notes (Signed)
Pt     Reports      Symptoms        Of           Headache    And  Slightly  Dizzy  According  To  Family  Member  -  Pt  Has  A  History  Of       htn and  Takes  Med  For  Same

## 2014-01-29 LAB — CULTURE, GROUP A STREP

## 2014-01-29 NOTE — Progress Notes (Signed)
Quick Note:  Results are abnormal as noted, but have been adequately treated. No further action necessary. ______ 

## 2014-01-31 ENCOUNTER — Telehealth (HOSPITAL_COMMUNITY): Payer: Self-pay | Admitting: *Deleted

## 2014-01-31 NOTE — ED Notes (Signed)
Throat culture: Group A strep (S. Pyogenes).  Pt. adequately treated with Amoxil.  I called pt.   Pt. verified x 2. Pt.'s English limited and she gave the phone to [facility] the [facility] [employer].  I asked if she gave permission to get the results and she said she gave the phone to me.  Results given.  I told her make sure pt. is taking the Amoxicillin and tell her to finish all of it.  If anyone else gets the same symptoms, they should get checked also.  If she is not better after the medication, she should get rechecked. She said she would make sure pt. understands. Vassie MoselleYork, Johnathin Vanderschaaf M 01/31/2014

## 2014-02-10 ENCOUNTER — Emergency Department (HOSPITAL_COMMUNITY)
Admission: EM | Admit: 2014-02-10 | Discharge: 2014-02-10 | Disposition: A | Discharge status: Home / Self Care | Payer: No Typology Code available for payment source | Source: Home / Self Care | Attending: Family Medicine | Admitting: Family Medicine

## 2014-02-10 ENCOUNTER — Encounter (HOSPITAL_COMMUNITY): Payer: Self-pay | Admitting: Emergency Medicine

## 2014-02-10 DIAGNOSIS — J020 Streptococcal pharyngitis: Secondary | ICD-10-CM

## 2014-02-10 LAB — POCT RAPID STREP A: STREPTOCOCCUS, GROUP A SCREEN (DIRECT): POSITIVE — AB

## 2014-02-10 MED ORDER — CLINDAMYCIN HCL 300 MG PO CAPS: 300.0000 mg | ORAL_CAPSULE | Freq: Three times a day (TID) | ORAL | Status: DC

## 2014-02-10 NOTE — ED Provider Notes (Signed)
CSN: [id number]     Arrival date & time 02/10/14  1159 History   First MD Initiated Contact with Patient 02/10/14 1246     Chief Complaint  Patient presents with  . Sore Throat   (Consider location/radiation/quality/duration/timing/severity/associated sxs/prior Treatment) Patient is a 38 y.o. female presenting with pharyngitis. The history is provided by the patient. A language interpreter was used (son and daughter trans.).  Sore Throat The current episode started 2 days ago (treated 2/28 with amox for strep , sx recurrent.). The problem has been gradually worsening. Pertinent negatives include no shortness of breath. The symptoms are aggravated by swallowing.    Past Medical History  Diagnosis Date  . No pertinent past medical history   . Hypertension    Past Surgical History  Procedure Laterality Date  . No past surgeries     History reviewed. No pertinent family history. History  Substance Use Topics  . Smoking status: Never Smoker   . Smokeless tobacco: Never Used  . Alcohol Use: No   OB History   Grav Para Term Preterm Abortions TAB SAB Ect Mult Living   4 4 4  0 0 0 0 0 0 4     Review of Systems  Constitutional: Negative.   HENT: Positive for ear pain and sore throat.   Respiratory: Negative for cough and shortness of breath.     Allergies  Review of patient's allergies indicates no known allergies.  Home Medications   Current Outpatient Rx  Name  Route  Sig  Dispense  Refill  . amoxicillin (AMOXIL) 875 MG tablet   Oral   Take 1 tablet (875 mg total) by mouth 2 (two) times daily.   20 tablet   0   . amoxicillin-clavulanate (AUGMENTIN) 875-125 MG per tablet   Oral   Take 1 tablet by mouth 2 (two) times daily.   20 tablet   0   . atenolol (TENORMIN) 50 MG tablet   Oral   Take 1 tablet (50 mg total) by mouth daily.   30 tablet   6   . clindamycin (CLEOCIN) 300 MG capsule   Oral   Take 1 capsule (300 mg total) by mouth 3 (three) times daily.   28  capsule   0   . docusate sodium (COLACE) 100 MG capsule   Oral   Take 1 capsule (100 mg total) by mouth 2 (two) times daily.   10 capsule   0   . hydrochlorothiazide (HYDRODIURIL) 25 MG tablet   Oral   Take 1 tablet (25 mg total) by mouth daily.   30 tablet   3   . ibuprofen (ADVIL,MOTRIN) 600 MG tablet   Oral   Take 1 tablet (600 mg total) by mouth every 8 (eight) hours as needed.   30 tablet   1   . Prenatal Vit-Fe Fumarate-FA (PRENATAL MULTIVITAMIN) TABS   Oral   Take 1 tablet by mouth every morning.          BP 129/84  Pulse 100  Temp(Src) 97.7 F (36.5 C) (Oral)  Resp 17  SpO2 97% Physical Exam  Nursing note and vitals reviewed. Constitutional: She is oriented to person, place, and time. She appears well-developed and well-nourished. No distress.  HENT:  Head: Normocephalic.  Right Ear: External ear normal.  Mouth/Throat: Uvula is midline and mucous membranes are normal. Oropharyngeal exudate and posterior oropharyngeal erythema present.  Neck: Normal range of motion. Neck supple.  Lymphadenopathy:    She has cervical  adenopathy.  Neurological: She is alert and oriented to person, place, and time.  Skin: Skin is warm and dry.    ED Course  Procedures (including critical care time) Labs Review Labs Reviewed  POCT RAPID STREP A ([facility] ONLY) - Abnormal; Notable for the following:    Streptococcus, Group A Screen (Direct) POSITIVE (*)    All other components within normal limits   Imaging Review No results found.   MDM   1. Strep sore throat       Linna HoffJames D Kindl, MD 02/10/14 1259

## 2014-02-10 NOTE — Discharge Instructions (Signed)
Drink lots of fluids, take all of medicine, use lozenges as needed.return if needed °

## 2014-02-10 NOTE — ED Notes (Signed)
Pt  Seen recently  For  Strep  Throat  She  Completed  A  Course  Of  Anti  Biotics        Still  Having  sorethroat  Gland  Swelling  And  Earache

## 2014-03-13 ENCOUNTER — Encounter: Payer: Self-pay | Admitting: Internal Medicine

## 2014-03-13 ENCOUNTER — Ambulatory Visit: Payer: No Typology Code available for payment source | Attending: Internal Medicine | Admitting: Internal Medicine

## 2014-03-13 VITALS — BP 130/81 | HR 104 | Temp 98.4°F | Resp 16

## 2014-03-13 DIAGNOSIS — M25559 Pain in unspecified hip: Secondary | ICD-10-CM | POA: Insufficient documentation

## 2014-03-13 DIAGNOSIS — I10 Essential (primary) hypertension: Secondary | ICD-10-CM

## 2014-03-13 DIAGNOSIS — M79609 Pain in unspecified limb: Secondary | ICD-10-CM

## 2014-03-13 DIAGNOSIS — R000 Tachycardia, unspecified: Secondary | ICD-10-CM

## 2014-03-13 DIAGNOSIS — M79673 Pain in unspecified foot: Secondary | ICD-10-CM | POA: Insufficient documentation

## 2014-03-13 LAB — CBC WITH DIFFERENTIAL/PLATELET
BASOS ABS: 0.1 10*3/uL (ref 0.0–0.1)
Basophils Relative: 1 % (ref 0–1)
Eosinophils Absolute: 0.1 10*3/uL (ref 0.0–0.7)
Eosinophils Relative: 2 % (ref 0–5)
HEMATOCRIT: 35.1 % — AB (ref 36.0–46.0)
Hemoglobin: 11.9 g/dL — ABNORMAL LOW (ref 12.0–15.0)
LYMPHS PCT: 33 % (ref 12–46)
Lymphs Abs: 2.2 10*3/uL (ref 0.7–4.0)
MCH: 25.9 pg — ABNORMAL LOW (ref 26.0–34.0)
MCHC: 33.9 g/dL (ref 30.0–36.0)
MCV: 76.3 fL — ABNORMAL LOW (ref 78.0–100.0)
Monocytes Absolute: 0.5 10*3/uL (ref 0.1–1.0)
Monocytes Relative: 7 % (ref 3–12)
NEUTROS ABS: 3.8 10*3/uL (ref 1.7–7.7)
NEUTROS PCT: 57 % (ref 43–77)
Platelets: 347 10*3/uL (ref 150–400)
RBC: 4.6 MIL/uL (ref 3.87–5.11)
RDW: 15.3 % (ref 11.5–15.5)
WBC: 6.6 10*3/uL (ref 4.0–10.5)

## 2014-03-13 LAB — COMPLETE METABOLIC PANEL WITH GFR
ALBUMIN: 4.3 g/dL (ref 3.5–5.2)
ALK PHOS: 66 U/L (ref 39–117)
ALT: 15 U/L (ref 0–35)
AST: 16 U/L (ref 0–37)
BUN: 15 mg/dL (ref 6–23)
CO2: 28 mEq/L (ref 19–32)
Calcium: 10 mg/dL (ref 8.4–10.5)
Chloride: 104 mEq/L (ref 96–112)
Creat: 0.45 mg/dL — ABNORMAL LOW (ref 0.50–1.10)
GFR, Est African American: >89 mL/min
GFR, Est Non African American: >89 mL/min
Glucose, Bld: 95 mg/dL (ref 70–99)
POTASSIUM: 3.5 meq/L (ref 3.5–5.3)
SODIUM: 142 meq/L (ref 135–145)
Total Bilirubin: 0.3 mg/dL (ref 0.2–1.2)
Total Protein: 8 g/dL (ref 6.0–8.3)

## 2014-03-13 LAB — TSH: TSH: 0.71 u[IU]/mL (ref 0.350–4.500)

## 2014-03-13 MED ORDER — ATENOLOL 50 MG PO TABS: 50.0000 mg | ORAL_TABLET | Freq: Every day | ORAL | Status: DC

## 2014-03-13 NOTE — Progress Notes (Signed)
MRN: [id number] Name: Carolyn Ramsey  Sex: female Age: 38 y.o. DOB: 06/24/1976  Allergies: Review of patient's allergies indicates no known allergies.  Chief Complaint  Patient presents with  . rapid pulse    HPI: Patient is 38 y.o. female who has to of hypertension comes today reported to have her heart rate once but usually more than 100, she showed me her heart rate and for several days being higher  100 to 106 range patient denies any headache dizziness chest pain or shortness of breath denies any fever, I also noticed her previous heart rate her in the higher range, she used to be on Tenormin in the past, currently  Takes  hydrochlorothiazide 25 mg daily. Patient also complained of heel pain denies any fall or trauma.  Past Medical History  Diagnosis Date  . No pertinent past medical history   . Hypertension     Past Surgical History  Procedure Laterality Date  . No past surgeries        Medication List       This list is accurate as of: 03/13/14 11:34 AM.  Always use your most recent med list.               amoxicillin 875 MG tablet  Commonly known as:  AMOXIL  Take 1 tablet (875 mg total) by mouth 2 (two) times daily.     amoxicillin-clavulanate 875-125 MG per tablet  Commonly known as:  AUGMENTIN  Take 1 tablet by mouth 2 (two) times daily.     atenolol 50 MG tablet  Commonly known as:  TENORMIN  Take 1 tablet (50 mg total) by mouth daily.     clindamycin 300 MG capsule  Commonly known as:  CLEOCIN  Take 1 capsule (300 mg total) by mouth 3 (three) times daily.     docusate sodium 100 MG capsule  Commonly known as:  COLACE  Take 1 capsule (100 mg total) by mouth 2 (two) times daily.     ibuprofen 600 MG tablet  Commonly known as:  ADVIL,MOTRIN  Take 1 tablet (600 mg total) by mouth every 8 (eight) hours as needed.     prenatal multivitamin Tabs tablet  Take 1 tablet by mouth every morning.        Meds ordered this encounter  Medications  .  atenolol (TENORMIN) 50 MG tablet    Sig: Take 1 tablet (50 mg total) by mouth daily.    Dispense:  30 tablet    Refill:  6    Immunization History  Administered Date(s) Administered  . Influenza Split 08/24/2012  . Tdap 09/06/2012    History reviewed. No pertinent family history.  History  Substance Use Topics  . Smoking status: Never Smoker   . Smokeless tobacco: Never Used  . Alcohol Use: No    Review of Systems   As noted in HPI  Filed Vitals:   03/13/14 1108  BP: 130/81  Pulse: 104  Temp: 98.4 F (36.9 C)  Resp: 16    Physical Exam  Physical Exam  Constitutional:  Obese female sitting comfortably not in acute distress  HENT:  Head: Normocephalic and atraumatic.  Eyes: EOM are normal. Pupils are equal, round, and reactive to light.  Cardiovascular: Normal rate and regular rhythm.   Tachycardic heart rate her is 106 manual, no murmur.  Pulmonary/Chest: Breath sounds normal. No respiratory distress. She has no wheezes. She has no rales.  Musculoskeletal:  Bilateral heel no tenderness on  palpation, 2+ dorsalis pedis pulse.    CBC    Component Value Date/Time   WBC 8.3 07/24/2013 1208   RBC 4.70 07/24/2013 1208   HGB 12.2 07/24/2013 1208   HCT 37.3 07/24/2013 1208   PLT 327 07/24/2013 1208   MCV 79.4 07/24/2013 1208   LYMPHSABS 3.4 07/24/2013 1208   MONOABS 0.5 07/24/2013 1208   EOSABS 0.2 07/24/2013 1208   BASOSABS 0.1 07/24/2013 1208    CMP     Component Value Date/Time   NA 139 12/27/2013 1106   K 3.5 12/27/2013 1106   CL 103 12/27/2013 1106   CO2 27 12/27/2013 1106   GLUCOSE 93 12/27/2013 1106   BUN 12 12/27/2013 1106   CREATININE 0.44* 12/27/2013 1106   CALCIUM 9.6 12/27/2013 1106   PROT 7.8 12/27/2013 1106   ALBUMIN 4.3 12/27/2013 1106   AST 15 12/27/2013 1106   ALT 10 12/27/2013 1106   ALKPHOS 67 12/27/2013 1106   BILITOT 0.3 12/27/2013 1106   GFRNONAA >89 12/27/2013 1106   GFRAA >89 12/27/2013 1106    Lab Results  Component Value Date/Time   CHOL  186 07/24/2013 12:08 PM    No components found with this basename: hga1c    Lab Results  Component Value Date/Time   AST 15 12/27/2013 11:06 AM    Assessment and Plan  Hypertension - Plan: I have discontinued hydrochlorothiazide and resume back on atenolol (TENORMIN) 50 MG tablet, will repeat blood chemistry COMPLETE METABOLIC PANEL WITH GFR  Tachycardia - Plan: Will check TSH, CBC with Differential, COMPLETE METABOLIC PANEL WITH GFR  Pain, heel Advised patient to wear comfortable shoes, Tylenol/ibuprofen when necessary for pain. Diet and exercise to lose weight.  Return in about 3 months (around 06/12/2014) for BP check in 2 weeks/Nurse Visit.  Doris Cheadleeepak Linsy Ehresman, MD

## 2014-03-13 NOTE — Progress Notes (Signed)
Patient here for rapid pulse Has been following with the congregational nurse And her pulses have bee running in the 100's Complains of pain to her left heel and Joint pain

## 2014-03-14 ENCOUNTER — Telehealth: Payer: Self-pay

## 2014-03-14 NOTE — Telephone Encounter (Signed)
Interpreter line used Patient has been advised of her lab results and to use  OTC iron supplements

## 2014-03-14 NOTE — Telephone Encounter (Signed)
Message copied by Lestine MountJUAREZ, Nancy Manuele L on Wed Mar 14, 2014  2:33 PM ------      Message from: Doris CheadleADVANI, DEEPAK      Created: Wed Mar 14, 2014 10:14 AM       Blood work reviewed, noticed borderline anemia, advise patient to take over-the-counter iron supplement daily. ------

## 2014-03-29 ENCOUNTER — Ambulatory Visit: Payer: No Typology Code available for payment source | Attending: Internal Medicine | Admitting: *Deleted

## 2014-03-29 ENCOUNTER — Encounter: Payer: Self-pay | Admitting: *Deleted

## 2014-03-29 NOTE — Progress Notes (Signed)
Patient here today for blood pressure recheck with an interpreter present. Blood pressure and pulse are normal. Patient expressed concerned about her anemia. Informed patient that according to the last note she spoke with Angelique Blonderenise and was informed to take OTC iron supplement. Patient asked would this make her gain weight. Informed patient that this should not. Educated patient to drink plenty of fluids while on the iron supplement to prevent constipation. Informed patient to keep her follow up appointment on Monday [id number] at 11:30 AM.  Patient verbalized understanding. Reather LaurenceJamie R Sonnet Rizor, RN

## 2014-04-02 ENCOUNTER — Encounter: Payer: Self-pay | Admitting: Internal Medicine

## 2014-04-02 ENCOUNTER — Ambulatory Visit: Payer: No Typology Code available for payment source | Attending: Internal Medicine | Admitting: Internal Medicine

## 2014-04-02 VITALS — BP 130/80 | HR 83 | Temp 98.8°F | Resp 16

## 2014-04-02 DIAGNOSIS — R000 Tachycardia, unspecified: Secondary | ICD-10-CM

## 2014-04-02 DIAGNOSIS — D649 Anemia, unspecified: Secondary | ICD-10-CM

## 2014-04-02 DIAGNOSIS — I10 Essential (primary) hypertension: Secondary | ICD-10-CM

## 2014-04-02 DIAGNOSIS — Z79899 Other long term (current) drug therapy: Secondary | ICD-10-CM | POA: Insufficient documentation

## 2014-04-02 NOTE — Progress Notes (Signed)
Patient is here with interpreter Here for follow up on her blood pressure

## 2014-04-02 NOTE — Progress Notes (Signed)
MRN: [id number] Name: Carolyn Ramsey  Sex: female Age: 38 y.o. DOB: 05/18/1976  Allergies: Review of patient's allergies indicates no known allergies.  Chief Complaint  Patient presents with  . Follow-up    HTN    HPI: Patient is 38 y.o. female who has history of hypertension tachycardia comes today for followup, on the last visit she was started on Tenormin her blood pressure is improving as well as her heart rate is under control, she denies any headache dizziness chest pain or shortness of breath, blood work reviewed with patient noticed borderline anemia most likely secondary to heavy menstruation as per patient which is getting better, I have advised patient to start taking over-the-counter iron supplements.  Past Medical History  Diagnosis Date  . No pertinent past medical history   . Hypertension     Past Surgical History  Procedure Laterality Date  . No past surgeries        Medication List       This list is accurate as of: 04/02/14 11:52 AM.  Always use your most recent med list.               amoxicillin 875 MG tablet  Commonly known as:  AMOXIL  Take 1 tablet (875 mg total) by mouth 2 (two) times daily.     amoxicillin-clavulanate 875-125 MG per tablet  Commonly known as:  AUGMENTIN  Take 1 tablet by mouth 2 (two) times daily.     atenolol 50 MG tablet  Commonly known as:  TENORMIN  Take 1 tablet (50 mg total) by mouth daily.     clindamycin 300 MG capsule  Commonly known as:  CLEOCIN  Take 1 capsule (300 mg total) by mouth 3 (three) times daily.     docusate sodium 100 MG capsule  Commonly known as:  COLACE  Take 1 capsule (100 mg total) by mouth 2 (two) times daily.     ibuprofen 600 MG tablet  Commonly known as:  ADVIL,MOTRIN  Take 1 tablet (600 mg total) by mouth every 8 (eight) hours as needed.     prenatal multivitamin Tabs tablet  Take 1 tablet by mouth every morning.        No orders of the defined types were placed in this  encounter.    Immunization History  Administered Date(s) Administered  . Influenza Split 08/24/2012  . Tdap 09/06/2012    History reviewed. No pertinent family history.  History  Substance Use Topics  . Smoking status: Never Smoker   . Smokeless tobacco: Never Used  . Alcohol Use: No    Review of Systems   As noted in HPI  Filed Vitals:   04/02/14 1142  BP: 130/80  Pulse:   Temp:   Resp:     Physical Exam  Physical Exam  Constitutional: No distress.  Eyes: EOM are normal. Pupils are equal, round, and reactive to light.  Cardiovascular: Normal rate and regular rhythm.   Pulmonary/Chest: Breath sounds normal. No respiratory distress. She has no wheezes. She has no rales.  Musculoskeletal: She exhibits no edema.    CBC    Component Value Date/Time   WBC 6.6 03/13/2014 1126   RBC 4.60 03/13/2014 1126   HGB 11.9* 03/13/2014 1126   HCT 35.1* 03/13/2014 1126   PLT 347 03/13/2014 1126   MCV 76.3* 03/13/2014 1126   LYMPHSABS 2.2 03/13/2014 1126   MONOABS 0.5 03/13/2014 1126   EOSABS 0.1 03/13/2014 1126   BASOSABS 0.1 03/13/2014  1126    CMP     Component Value Date/Time   NA 142 03/13/2014 1126   K 3.5 03/13/2014 1126   CL 104 03/13/2014 1126   CO2 28 03/13/2014 1126   GLUCOSE 95 03/13/2014 1126   BUN 15 03/13/2014 1126   CREATININE 0.45* 03/13/2014 1126   CALCIUM 10.0 03/13/2014 1126   PROT 8.0 03/13/2014 1126   ALBUMIN 4.3 03/13/2014 1126   AST 16 03/13/2014 1126   ALT 15 03/13/2014 1126   ALKPHOS 66 03/13/2014 1126   BILITOT 0.3 03/13/2014 1126   GFRNONAA >89 03/13/2014 1126   GFRAA >89 03/13/2014 1126    Lab Results  Component Value Date/Time   CHOL 186 07/24/2013 12:08 PM    No components found with this basename: hga1c    Lab Results  Component Value Date/Time   AST 16 03/13/2014 11:26 AM    Assessment and Plan  Hypertension Improving continue with her Tenormin, low salt diet.  Tachycardia Improved patient to continue with Tenormin.  Anemia I have  advised patient to take over-the-counter iron supplements.    Return in about 4 months (around 08/03/2014) for hypertension.  Doris Cheadleeepak Steele Ledonne, MD

## 2014-06-19 ENCOUNTER — Telehealth: Payer: Self-pay | Admitting: Internal Medicine

## 2014-06-29 ENCOUNTER — Encounter: Payer: Self-pay | Admitting: Internal Medicine

## 2014-06-29 ENCOUNTER — Ambulatory Visit: Payer: No Typology Code available for payment source | Attending: Internal Medicine | Admitting: Internal Medicine

## 2014-06-29 VITALS — BP 154/95 | HR 93 | Temp 98.5°F | Resp 16

## 2014-06-29 DIAGNOSIS — G44209 Tension-type headache, unspecified, not intractable: Secondary | ICD-10-CM

## 2014-06-29 DIAGNOSIS — I10 Essential (primary) hypertension: Secondary | ICD-10-CM

## 2014-06-29 DIAGNOSIS — D649 Anemia, unspecified: Secondary | ICD-10-CM

## 2014-06-29 MED ORDER — AMITRIPTYLINE HCL 50 MG PO TABS: 50.0000 mg | ORAL_TABLET | Freq: Every day | ORAL | Status: DC

## 2014-06-29 MED ORDER — ATENOLOL 50 MG PO TABS: 50.0000 mg | ORAL_TABLET | Freq: Every day | ORAL | Status: DC

## 2014-06-29 MED ORDER — CEPHALEXIN 500 MG PO CAPS: 500.0000 mg | ORAL_CAPSULE | Freq: Four times a day (QID) | ORAL | Status: DC

## 2014-06-29 NOTE — Progress Notes (Signed)
Patient here with interpreter Complains of having headaches over the past two months Recently she has been having them every day

## 2014-06-29 NOTE — Progress Notes (Signed)
MRN: [id number] Name: Carolyn Ramsey  Sex: female Age: 38 y.o. DOB: 02/03/1976  Allergies: Review of patient's allergies indicates no known allergies.  Chief Complaint  Patient presents with  . Headache    HPI: Patient is 38 y.o. female who complains of headache for the last 2-3 months which is mostly on the right side, denies any nausea vomiting any change in the vision numbness weakness, denies any family history of migraine headaches, the aggravating factor is when she is under stress and shout at her children, patient has been taking over-the-counter Tylenol for pain, she also history of hypertension today her blood pressure is borderline elevated, she is requesting refill on her medications. She also history of anemia but has not been taking iron supplements. Patient also reported to have symptoms of mastitis and is requesting prescription for antibiotic.  Past Medical History  Diagnosis Date  . No pertinent past medical history   . Hypertension     Past Surgical History  Procedure Laterality Date  . No past surgeries        Medication List       This list is accurate as of: 06/29/14  5:14 PM.  Always use your most recent med list.               amitriptyline 50 MG tablet  Commonly known as:  ELAVIL  Take 1 tablet (50 mg total) by mouth at bedtime.     amoxicillin 875 MG tablet  Commonly known as:  AMOXIL  Take 1 tablet (875 mg total) by mouth 2 (two) times daily.     amoxicillin-clavulanate 875-125 MG per tablet  Commonly known as:  AUGMENTIN  Take 1 tablet by mouth 2 (two) times daily.     atenolol 50 MG tablet  Commonly known as:  TENORMIN  Take 1 tablet (50 mg total) by mouth daily.     cephALEXin 500 MG capsule  Commonly known as:  KEFLEX  Take 1 capsule (500 mg total) by mouth 4 (four) times daily.     clindamycin 300 MG capsule  Commonly known as:  CLEOCIN  Take 1 capsule (300 mg total) by mouth 3 (three) times daily.     docusate sodium 100  MG capsule  Commonly known as:  COLACE  Take 1 capsule (100 mg total) by mouth 2 (two) times daily.     ibuprofen 600 MG tablet  Commonly known as:  ADVIL,MOTRIN  Take 1 tablet (600 mg total) by mouth every 8 (eight) hours as needed.     prenatal multivitamin Tabs tablet  Take 1 tablet by mouth every morning.        Meds ordered this encounter  Medications  . amitriptyline (ELAVIL) 50 MG tablet    Sig: Take 1 tablet (50 mg total) by mouth at bedtime.    Dispense:  30 tablet    Refill:  3  . atenolol (TENORMIN) 50 MG tablet    Sig: Take 1 tablet (50 mg total) by mouth daily.    Dispense:  30 tablet    Refill:  6  . cephALEXin (KEFLEX) 500 MG capsule    Sig: Take 1 capsule (500 mg total) by mouth 4 (four) times daily.    Dispense:  28 capsule    Refill:  0    Immunization History  Administered Date(s) Administered  . Influenza Split 08/24/2012  . Tdap 09/06/2012    History reviewed. No pertinent family history.  History  Substance Use Topics  .  Smoking status: Never Smoker   . Smokeless tobacco: Never Used  . Alcohol Use: No    Review of Systems   As noted in HPI  Filed Vitals:   06/29/14 1627  BP: 154/95  Pulse: 93  Temp: 98.5 F (36.9 C)  Resp: 16    Physical Exam  Physical Exam  Constitutional: No distress.  Eyes: Pupils are equal, round, and reactive to light.  Cardiovascular: Normal rate and regular rhythm.   Pulmonary/Chest: Breath sounds normal. No respiratory distress. She has no wheezes. She has no rales.    CBC    Component Value Date/Time   WBC 6.6 03/13/2014 1126   RBC 4.60 03/13/2014 1126   HGB 11.9* 03/13/2014 1126   HCT 35.1* 03/13/2014 1126   PLT 347 03/13/2014 1126   MCV 76.3* 03/13/2014 1126   LYMPHSABS 2.2 03/13/2014 1126   MONOABS 0.5 03/13/2014 1126   EOSABS 0.1 03/13/2014 1126   BASOSABS 0.1 03/13/2014 1126    CMP     Component Value Date/Time   NA 142 03/13/2014 1126   K 3.5 03/13/2014 1126   CL 104 03/13/2014 1126   CO2  28 03/13/2014 1126   GLUCOSE 95 03/13/2014 1126   BUN 15 03/13/2014 1126   CREATININE 0.45* 03/13/2014 1126   CALCIUM 10.0 03/13/2014 1126   PROT 8.0 03/13/2014 1126   ALBUMIN 4.3 03/13/2014 1126   AST 16 03/13/2014 1126   ALT 15 03/13/2014 1126   ALKPHOS 66 03/13/2014 1126   BILITOT 0.3 03/13/2014 1126   GFRNONAA >89 03/13/2014 1126   GFRAA >89 03/13/2014 1126    Lab Results  Component Value Date/Time   CHOL 186 07/24/2013 12:08 PM    No components found with this basename: hga1c    Lab Results  Component Value Date/Time   AST 16 03/13/2014 11:26 AM    Assessment and Plan  Tension headache - Plan: I have started patient on amitriptyline (ELAVIL) 50 MG tablet each bedtime  Essential hypertension - Plan: Advised patient for DASH diet continue with atenolol (TENORMIN) 50 MG tablet  Anemia, unspecified Advised patient to start taking over-the-counter iron supplements    Return in about 3 months (around 09/29/2014) for hypertension.  Doris Cheadle, MD

## 2014-06-29 NOTE — Patient Instructions (Signed)
DASH Eating Plan °DASH stands for "Dietary Approaches to Stop Hypertension." The DASH eating plan is a healthy eating plan that has been shown to reduce high blood pressure (hypertension). Additional health benefits may include reducing the risk of type 2 diabetes mellitus, heart disease, and stroke. The DASH eating plan may also help with weight loss. °WHAT DO I NEED TO KNOW ABOUT THE DASH EATING PLAN? °For the DASH eating plan, you will follow these general guidelines: °· Choose foods with a percent daily value for sodium of less than 5% (as listed on the food label). °· Use salt-free seasonings or herbs instead of table salt or sea salt. °· Check with your health care provider or pharmacist before using salt substitutes. °· Eat lower-sodium products, often labeled as "lower sodium" or "no salt added." °· Eat fresh foods. °· Eat more vegetables, fruits, and low-fat dairy products. °· Choose whole grains. Look for the word "whole" as the first word in the ingredient list. °· Choose fish and skinless chicken or turkey more often than red meat. Limit fish, poultry, and meat to 6 oz (170 g) each day. °· Limit sweets, desserts, sugars, and sugary drinks. °· Choose heart-healthy fats. °· Limit cheese to 1 oz (28 g) per day. °· Eat more home-cooked food and less restaurant, buffet, and fast food. °· Limit fried foods. °· Cook foods using methods other than frying. °· Limit canned vegetables. If you do use them, rinse them well to decrease the sodium. °· When eating at a restaurant, ask that your food be prepared with less salt, or no salt if possible. °WHAT FOODS CAN I EAT? °Seek help from a dietitian for individual calorie needs. °Grains °Whole grain or whole wheat bread. Brown rice. Whole grain or whole wheat pasta. Quinoa, bulgur, and whole grain cereals. Low-sodium cereals. Corn or whole wheat flour tortillas. Whole grain cornbread. Whole grain crackers. Low-sodium crackers. °Vegetables °Fresh or frozen vegetables  (raw, steamed, roasted, or grilled). Low-sodium or reduced-sodium tomato and vegetable juices. Low-sodium or reduced-sodium tomato sauce and paste. Low-sodium or reduced-sodium canned vegetables.  °Fruits °All fresh, canned (in natural juice), or frozen fruits. °Meat and Other Protein Products °Ground beef (85% or leaner), grass-fed beef, or beef trimmed of fat. Skinless chicken or turkey. Ground chicken or turkey. Pork trimmed of fat. All fish and seafood. Eggs. Dried beans, peas, or lentils. Unsalted nuts and seeds. Unsalted canned beans. °Dairy °Low-fat dairy products, such as skim or 1% milk, 2% or reduced-fat cheeses, low-fat ricotta or cottage cheese, or plain low-fat yogurt. Low-sodium or reduced-sodium cheeses. °Fats and Oils °Tub margarines without trans fats. Light or reduced-fat mayonnaise and salad dressings (reduced sodium). Avocado. Safflower, olive, or canola oils. Natural peanut or almond butter. °Other °Unsalted popcorn and pretzels. °The items listed above may not be a complete list of recommended foods or beverages. Contact your dietitian for more options. °WHAT FOODS ARE NOT RECOMMENDED? °Grains °White bread. White pasta. White rice. Refined cornbread. Bagels and croissants. Crackers that contain trans fat. °Vegetables °Creamed or fried vegetables. Vegetables in a cheese sauce. Regular canned vegetables. Regular canned tomato sauce and paste. Regular tomato and vegetable juices. °Fruits °Dried fruits. Canned fruit in light or heavy syrup. Fruit juice. °Meat and Other Protein Products °Fatty cuts of meat. Ribs, chicken wings, bacon, sausage, bologna, salami, chitterlings, fatback, hot dogs, bratwurst, and packaged luncheon meats. Salted nuts and seeds. Canned beans with salt. °Dairy °Whole or 2% milk, cream, half-and-half, and cream cheese. Whole-fat or sweetened yogurt. Full-fat   cheeses or blue cheese. Nondairy creamers and whipped toppings. Processed cheese, cheese spreads, or cheese  curds. °Condiments °Onion and garlic salt, seasoned salt, table salt, and sea salt. Canned and packaged gravies. Worcestershire sauce. Tartar sauce. Barbecue sauce. Teriyaki sauce. Soy sauce, including reduced sodium. Steak sauce. Fish sauce. Oyster sauce. Cocktail sauce. Horseradish. Ketchup and mustard. Meat flavorings and tenderizers. Bouillon cubes. Hot sauce. Tabasco sauce. Marinades. Taco seasonings. Relishes. °Fats and Oils °Butter, stick margarine, lard, shortening, ghee, and bacon fat. Coconut, palm kernel, or palm oils. Regular salad dressings. °Other °Pickles and olives. Salted popcorn and pretzels. °The items listed above may not be a complete list of foods and beverages to avoid. Contact your dietitian for more information. °WHERE CAN I FIND MORE INFORMATION? °[employer]: www.nhlbi.nih.gov/health/health-topics/topics/dash/ °Document Released: 11/05/2011 Document Revised: 04/02/2014 Document Reviewed: 09/20/2013 °[employer]® Patient Information ©2015 [employer]. This information is not intended to replace advice given to you by your health care provider. Make sure you discuss any questions you have with your health care provider. ° °

## 2014-07-02 DIAGNOSIS — D649 Anemia, unspecified: Secondary | ICD-10-CM | POA: Insufficient documentation

## 2014-07-18 ENCOUNTER — Ambulatory Visit: Payer: No Typology Code available for payment source

## 2014-08-20 ENCOUNTER — Ambulatory Visit: Payer: Self-pay | Attending: Internal Medicine

## 2014-10-01 ENCOUNTER — Encounter: Payer: Self-pay | Admitting: Internal Medicine

## 2014-11-08 ENCOUNTER — Encounter: Payer: Self-pay | Admitting: Internal Medicine

## 2014-11-08 ENCOUNTER — Ambulatory Visit: Payer: Self-pay | Attending: Internal Medicine | Admitting: Internal Medicine

## 2014-11-08 VITALS — BP 145/90 | HR 89 | Temp 98.0°F | Resp 16 | Wt 227.8 lb

## 2014-11-08 DIAGNOSIS — Z9109 Other allergy status, other than to drugs and biological substances: Secondary | ICD-10-CM

## 2014-11-08 DIAGNOSIS — J029 Acute pharyngitis, unspecified: Secondary | ICD-10-CM | POA: Insufficient documentation

## 2014-11-08 DIAGNOSIS — Z91048 Other nonmedicinal substance allergy status: Secondary | ICD-10-CM

## 2014-11-08 DIAGNOSIS — I10 Essential (primary) hypertension: Secondary | ICD-10-CM | POA: Insufficient documentation

## 2014-11-08 DIAGNOSIS — R0981 Nasal congestion: Secondary | ICD-10-CM | POA: Insufficient documentation

## 2014-11-08 LAB — POCT RAPID STREP A (OFFICE): RAPID STREP A SCREEN: NEGATIVE

## 2014-11-08 MED ORDER — CETIRIZINE HCL 10 MG PO TABS: 10.0000 mg | ORAL_TABLET | Freq: Every day | ORAL | Status: DC

## 2014-11-08 MED ORDER — FLUTICASONE PROPIONATE 50 MCG/ACT NA SUSP: 2.0000 | Freq: Every day | NASAL | Status: DC

## 2014-11-08 MED ORDER — AMOXICILLIN-POT CLAVULANATE 875-125 MG PO TABS: 1.0000 | ORAL_TABLET | Freq: Two times a day (BID) | ORAL | Status: DC

## 2014-11-08 MED ORDER — ATENOLOL 50 MG PO TABS: 50.0000 mg | ORAL_TABLET | Freq: Every day | ORAL | Status: DC

## 2014-11-08 NOTE — Progress Notes (Signed)
Patient complains of having sore throat for the past two months

## 2014-11-08 NOTE — Progress Notes (Signed)
MRN: [id number] Name: Carolyn Ramsey  Sex: female Age: 38 y.o. DOB: 02/17/1976  Allergies: Review of patient's allergies indicates no known allergies.  Chief Complaint  Patient presents with  . Sore Throat    HPI: Patient is 38 y.o. female who history of hypertension comes today complaining of her nasal congestion postnasal drip sore throat for the last few days denies any fever chills has minimal cough denies any chest pain or shortness of breath, she is also requesting refill on her medications, today her blood pressure is borderline elevated, denies any headache dizziness.  Past Medical History  Diagnosis Date  . No pertinent past medical history   . Hypertension     Past Surgical History  Procedure Laterality Date  . No past surgeries        Medication List       This list is accurate as of: 11/08/14  4:25 PM.  Always use your most recent med list.               amitriptyline 50 MG tablet  Commonly known as:  ELAVIL  Take 1 tablet (50 mg total) by mouth at bedtime.     amoxicillin 875 MG tablet  Commonly known as:  AMOXIL  Take 1 tablet (875 mg total) by mouth 2 (two) times daily.     amoxicillin-clavulanate 875-125 MG per tablet  Commonly known as:  AUGMENTIN  Take 1 tablet by mouth 2 (two) times daily.     atenolol 50 MG tablet  Commonly known as:  TENORMIN  Take 1 tablet (50 mg total) by mouth daily.     cephALEXin 500 MG capsule  Commonly known as:  KEFLEX  Take 1 capsule (500 mg total) by mouth 4 (four) times daily.     cetirizine 10 MG tablet  Commonly known as:  ZYRTEC  Take 1 tablet (10 mg total) by mouth daily.     clindamycin 300 MG capsule  Commonly known as:  CLEOCIN  Take 1 capsule (300 mg total) by mouth 3 (three) times daily.     docusate sodium 100 MG capsule  Commonly known as:  COLACE  Take 1 capsule (100 mg total) by mouth 2 (two) times daily.     fluticasone 50 MCG/ACT nasal spray  Commonly known as:  FLONASE  Place 2  sprays into both nostrils daily.     ibuprofen 600 MG tablet  Commonly known as:  ADVIL,MOTRIN  Take 1 tablet (600 mg total) by mouth every 8 (eight) hours as needed.     prenatal multivitamin Tabs tablet  Take 1 tablet by mouth every morning.        Meds ordered this encounter  Medications  . amoxicillin-clavulanate (AUGMENTIN) 875-125 MG per tablet    Sig: Take 1 tablet by mouth 2 (two) times daily.    Dispense:  20 tablet    Refill:  0  . cetirizine (ZYRTEC) 10 MG tablet    Sig: Take 1 tablet (10 mg total) by mouth daily.    Dispense:  30 tablet    Refill:  3  . fluticasone (FLONASE) 50 MCG/ACT nasal spray    Sig: Place 2 sprays into both nostrils daily.    Dispense:  16 g    Refill:  6  . atenolol (TENORMIN) 50 MG tablet    Sig: Take 1 tablet (50 mg total) by mouth daily.    Dispense:  30 tablet    Refill:  6  Immunization History  Administered Date(s) Administered  . Influenza Split 08/24/2012  . Tdap 09/06/2012    History reviewed. No pertinent family history.  History  Substance Use Topics  . Smoking status: Never Smoker   . Smokeless tobacco: Never Used  . Alcohol Use: No    Review of Systems   As noted in HPI  Filed Vitals:   11/08/14 1551  BP: 145/90  Pulse: 89  Temp: 98 F (36.7 C)  Resp: 16    Physical Exam  Physical Exam  HENT:  Nasal congestion ? Polyps , pharyngeal erythema no exudate   Eyes: EOM are normal. Pupils are equal, round, and reactive to light.  Cardiovascular: Normal rate and regular rhythm.   Pulmonary/Chest: Breath sounds normal. No respiratory distress. She has no wheezes. She has no rales.  Musculoskeletal: She exhibits no edema.    CBC    Component Value Date/Time   WBC 6.6 03/13/2014 1126   RBC 4.60 03/13/2014 1126   HGB 11.9* 03/13/2014 1126   HCT 35.1* 03/13/2014 1126   PLT 347 03/13/2014 1126   MCV 76.3* 03/13/2014 1126   LYMPHSABS 2.2 03/13/2014 1126   MONOABS 0.5 03/13/2014 1126   EOSABS 0.1  03/13/2014 1126   BASOSABS 0.1 03/13/2014 1126    CMP     Component Value Date/Time   NA 142 03/13/2014 1126   K 3.5 03/13/2014 1126   CL 104 03/13/2014 1126   CO2 28 03/13/2014 1126   GLUCOSE 95 03/13/2014 1126   BUN 15 03/13/2014 1126   CREATININE 0.45* 03/13/2014 1126   CALCIUM 10.0 03/13/2014 1126   PROT 8.0 03/13/2014 1126   ALBUMIN 4.3 03/13/2014 1126   AST 16 03/13/2014 1126   ALT 15 03/13/2014 1126   ALKPHOS 66 03/13/2014 1126   BILITOT 0.3 03/13/2014 1126   GFRNONAA >89 03/13/2014 1126   GFRAA >89 03/13/2014 1126    Lab Results  Component Value Date/Time   CHOL 186 07/24/2013 12:08 PM    No components found for: HGA1C  Lab Results  Component Value Date/Time   AST 16 03/13/2014 11:26 AM    Assessment and Plan  Sore throat /Pharyngitis - Plan: Rapid Strep Ais negative she has symptoms of pharyngitis, amoxicillin-clavulanate (AUGMENTIN) 875-125 MG per tablet  Essential hypertension - Plan:as patient for DASH diet continue with atenolol (TENORMIN) 50 MG tablet, COMPLETE METABOLIC PANEL WITH GFR  Environmental allergies - Plan: cetirizine (ZYRTEC) 10 MG tablett  Nasal congestion - Plan: fluticasone (FLONASE) 50 MCG/ACT nasal spray   Return in about 3 months (around 02/07/2015) for hypertension.  Doris CheadleADVANI, Karma Hiney, MD

## 2014-11-09 LAB — COMPLETE METABOLIC PANEL WITH GFR
ALT: 13 U/L (ref 0–35)
AST: 14 U/L (ref 0–37)
Albumin: 4.1 g/dL (ref 3.5–5.2)
Alkaline Phosphatase: 64 U/L (ref 39–117)
BUN: 17 mg/dL (ref 6–23)
CALCIUM: 9.4 mg/dL (ref 8.4–10.5)
CHLORIDE: 106 meq/L (ref 96–112)
CO2: 23 mEq/L (ref 19–32)
Creat: 0.43 mg/dL — ABNORMAL LOW (ref 0.50–1.10)
GFR, Est Non African American: >89 mL/min
Glucose, Bld: 102 mg/dL — ABNORMAL HIGH (ref 70–99)
Potassium: 4.1 mEq/L (ref 3.5–5.3)
Sodium: 139 mEq/L (ref 135–145)
Total Bilirubin: 0.2 mg/dL (ref 0.2–1.2)
Total Protein: 7.5 g/dL (ref 6.0–8.3)

## 2014-11-19 ENCOUNTER — Encounter (HOSPITAL_COMMUNITY): Payer: Self-pay | Admitting: Emergency Medicine

## 2014-11-19 ENCOUNTER — Emergency Department (HOSPITAL_COMMUNITY)
Admission: EM | Admit: 2014-11-19 | Discharge: 2014-11-19 | Disposition: A | Discharge status: Home / Self Care | Payer: Self-pay | Source: Home / Self Care | Attending: Emergency Medicine | Admitting: Emergency Medicine

## 2014-11-19 DIAGNOSIS — G5601 Carpal tunnel syndrome, right upper limb: Secondary | ICD-10-CM

## 2014-11-19 DIAGNOSIS — K121 Other forms of stomatitis: Secondary | ICD-10-CM

## 2014-11-19 DIAGNOSIS — I10 Essential (primary) hypertension: Secondary | ICD-10-CM

## 2014-11-19 DIAGNOSIS — G44229 Chronic tension-type headache, not intractable: Secondary | ICD-10-CM

## 2014-11-19 MED ORDER — PREDNISONE 10 MG PO TABS: ORAL_TABLET | ORAL | Status: DC

## 2014-11-19 MED ORDER — AMITRIPTYLINE HCL 25 MG PO TABS: ORAL_TABLET | ORAL | Status: DC

## 2014-11-19 MED ORDER — MAGIC MOUTHWASH: ORAL | Status: DC

## 2014-11-19 NOTE — ED Notes (Addendum)
Pt complains of recurrent headaches, sometimes two a day, sometimes none at all. Pt denies any N&V w/ the h/a's.  No sensitivity to light or sound.  Pt states she has felt feverish at home and feels as if she has a rash on the roof of her mouth.

## 2014-11-19 NOTE — Discharge Instructions (Signed)
Do not take any over the counter medicines for headache.  Do exercises below 2 times daily.  Wear your brace all the time except when you take a shower or bath.  See Dr. Orpah CobbAdvani in 2 weeks.  Carpal tunnel syndrome is caused by compression of the median nerve in the wrist.  Most often, inflammation of the tendons that pass through the carpal tunnel and surround the median nerve is the causative factor.  Wear your wrist splint as much as possible, especially at night.    The following exercises should be done twice daily:  Exercises for Carpal Tunnel Syndrome  Wrists Exercise 1.  Make a loose right fist, palm up, and use the left hand to press gently down against the clenched hand.  Resist the force with the closed right hand for 5 seconds. Be sure to keep the wrist straight.  Turn the right fist palm down, and press the knuckles against the left open palm for 5 seconds.  Finally, turn the right palm so the thumb-side of the fist is up, and press down again for 5 seconds.  Repeat with the left hand.   Exercise 2.  Hold one hand straight up shoulder-high with fingers together and palm facing outward. (The position looks like a Dietitianshoulder-high salute.)  With the other hand, bend the hand being exercised backward with the fingers still held together and hold for 5 seconds.  Spread the fingers and thumb open while the hand is still bent back and hold for 5 seconds.  Repeat five times for each hand.   Exercise 3. (Wrist Circle)  Hold the second and third fingers up, and close the others.  Draw five clockwise circles in the air with the two finger tips.  Draw five more counterclockwise circles.  Repeat with the other hand.  Fingers and Hand Exercise 1.  Clench the fingers of one hand into a fist tightly.  Release, fanning out the fingers.  Do this five times. Repeat with the other hand.   Exercise 2.  To exercise the thumb, bend it against the palm beneath the little finger,  and hold for 5 seconds.  Spread the fingers apart, palm up, and hold for 5 seconds.  Repeat five to 10 times with each hand.   Exercise 3.  Gently pull the thumb out and back and hold for 5 seconds.  Repeat five to 10 times with each hand.  Forearms (stretching these muscles will reduce tension in the wrist)  Place the hands together in front of the chest, fingers pointed upward in a prayer-like position.  Keeping the palms flat together, raise the elbows to stretch the forearm muscles.  Stretch for 10 seconds.  Gently shake the hands limp for a few seconds to loosen them.  Repeat frequently when the hands or arms tire from activity.  Neck and Shoulders Exercise 1.  Sit upright and place the right hand on top of the left shoulder.  Hold that shoulder down, and slowly tip the head down toward the right.  Keep the face pointed forward, or even turned slightly toward the right.  Hold this stretch gently for 5 seconds.  Repeat on the other side.   Exercise 2.  Stand in a relaxed position with the arms at the side.  Shrug the shoulders up, then squeeze the shoulders back, then stretch the shoulders down, and then press them forward.  The entire exercise should take about 7 seconds.    If you are employed in  a job that requires repetitive hand or wrist movement (this includes keyboarding or use of a computer mouse) paying attention to work ergonomics is of utmost importance:  Preventing CTS in Keyboard Workers Altering the way a person performs repetitive activities may help prevent inflammation in the hand and wrist. Most of the interventions described below have been found to reduce repetitive motion problems in the muscles and tendons of the hand and arm. They may reduce the incidence of carpal tunnel syndrome, although there is no definite proof of this effect. Replacing old tools with ergonomically designed new ones can be very helpful. Rest Periods and Avoiding  Repetition. Anyone who does repetitive tasks should begin with a short warm-up period, take frequent breaks, and avoid overexertion of the hand and finger muscles whenever possible. Employers should be urged to vary the tasks and work Human resources officercontent of their employees. Taking multiple "microbreaks" (about 3 minutes each) reduces strain and discomfort without decreasing productivity. Such breaks may include the following:  Shaking or stretching the limbs  Leaning back in the chair  Squeezing the shoulder blades together.  Taking deep breaths Good Posture. Good posture is extremely important in preventing carpal tunnel syndrome, particularly for typists and computer users.  The worker should sit with the spine against the back of the chair with the shoulders relaxed.  The elbows should rest along the sides of the body, with wrists straight.  The feet should be firmly on the floor or on a footrest.  Typing materials should be at eye level so that the neck does not bend over the work.  Keeping the neck flexible and head upright maintains circulation and nerve function to the arms and hands. One method for finding the correct head position is the "pigeon" movement. Keeping the chin level, glide the head slowly and gently forward and backward in small movements, avoiding neck discomfort. Good Office Furniture. Poorly designed office furniture is a major contributor to bad posture. Chairs should be adjustable for height, with a supportive backrest. Custom-designed chairs, made for people who do not fit in standard chairs, can be expensive. However, the costs are often offset by the savings in medical expenses that follow injuries related to bad posture. Voice Recognition Software. For CTS patients who must use a computer frequently, a variety of voice recognition software packages (ViaVoice, Voice Xpress, Dragon NaturallySpeaking, SunsetListen) are now available, enabling virtually hands-free computer  use. Keyboard and Mouse Tips. Anyone using a keyboard and mouse has some options that may help protect the hands.  The tension of the keys should be adjusted so they can be depressed without excessive force.  The hands and wrists should remain in a relaxed position to avoid excessive force on the keyboard.  A 2003 study suggested that mouse-use poses a higher risk than keyboard use. Replacing the mouse with a trackball device and the standard keyboard with a jointed-type keyboard are helpful substitutions.  Wrist rests, which fit under most keyboards, can help keep the wrists and fingers in a comfortable position.  Some people recommend keeping the computer mouse as close to the keyboard and the user's body as possible, to reduce shoulder muscle movement.  The mouse should be held lightly, with the wrist and forearm relaxed. New mouse supports are also available that relieve stress on the hand and support the wrist.  Some people cut their mouse pads in half to reduce movement. Innovative keyboard designs may reduce hand stress:  Alternative geometry keyboards Lexicographer(Microsoft Natural Keyboard, Apple Adjustable  Keyboard) allow the user to adjust and modify hand positions as well as adjust key tension. Most have a split or "slanted" keyboard that places the wrists at an angle. Studies suggest they are useful in promoting a neutral position for the wrist.  The continuous passive motion (CPM) keyboard lifts and declines gently and automatically every 3 minutes to break tension on the hands and wrist.  A keyless keyboard (orbiTouch) is an innovative device that uses two domes. The typist covers the domes with their hands and slides them into different positions that represent letters. Reducing Force from [employer] The force placed on the fingers, hands, and wrists by a repetitive task is an important contributor to CTS. To alleviate the effect of force on the wrist, tools and tasks should be designed  so that the wrist position is the same as it would be if the arms dangled in a relaxed manner at the sides.  No task should require the wrist to deviate from side to side or to remain flexed or highly extended for long periods.  The handles of hand tools such as screwdrivers, scrapers, paint brushes, and buffers should be designed so that the force of the worker's grip is distributed across the muscle between the base of the thumb and the little finger, not just in the center of the palm.  People who need to hold tools (including pencils and steering wheels) for long periods of time should grip them as loosely as possible.  In order to apply force appropriately, the ability to feel an object is extremely important. Tools with textured handles are helpful.  If possible, people should avoid working at low temperatures, which reduces sensation in hands and fingers.  Power tools and machines should be designed to minimize vibrations.  Wearing thick gloves, when possible, may lessen the shock transmitted to the hands and wrists.

## 2014-11-19 NOTE — ED Provider Notes (Signed)
Chief Complaint   Headache; Numbness; and Fever   History of Present Illness   Carolyn Ramsey is a 38 year old female, [facility], who presents today with multiple problems including headaches, numbness and pain in the right arm, subjective fever, sores in her mouth, and high blood pressure. She speaks almost no [place], so history was obtained with the help of a telephone interpreter.  The headaches have been going on for over a year off and on. There bitemporal and a deep dull, steady ache. She gets relief with an over-the-counter preparation contain Tylenol, aspirin, and caffeine. She takes at least 2 of these every day. Headaches occur almost every day, sometimes several times a day. They're not associated with nausea, photophobia, or phonophobia. She denies any visual symptoms, stiff neck, difficulty with speech, or ambulation.  She also has had a three-day history of numbness and pain in the thumb, index, middle, and ring finger of the right hand. The patient states she just woke up with this. The pain and numbness radiates up the arm, towards the shoulder. She denies any weakness or swelling. There is no neck pain.  She also has had a three-day history of subjective fever. She's not measured her temperature. She denies any sore throat, nasal congestion, rhinorrhea, cough, chest pain, shortness of breath, or GI symptoms. No urinary symptoms.  Her next complaint has been soreness in her mouth. She notes soreness of the tongue the roof of the mouth. She thinks she can feel some bumps in that area. She has pain radiating to the entire right side of her face, her head, at her ear.  She also has a history of high blood pressure and is on atenolol for that. [facility]. Her blood pressure today is normal. She denies any dizziness, chest pain, or shortness of breath.  Review of Systems     Other than as  noted above, the patient denies any of the following symptoms: Systemic:  No chills, fatigue, myalgias, or anorexia. Eye:  No redness, pain or drainage. ENT:  No earache, nasal congestion, rhinorrhea, sinus pressure, or sore throat. Lungs:  No cough, sputum production, wheezing, shortness of breath.  Cardiovascular:  No chest pain, palpitations, or syncope. GI:  No nausea, vomiting, abdominal pain or diarrhea. GU:  No dysuria, frequency, or hematuria. Skin:  No rash or pruritis.   PMFSH     Past medical history, family history, social history, meds, and allergies were reviewed.    Physical Examination    Vital signs:  BP 136/84 mmHg  Pulse 83  Temp(Src) 99 F (37.2 C) (Oral)  Resp 18  SpO2 100% General:  Alert, in no distress. Eye:  PERRL, full EOMs.  Lids and conjunctivas were normal. ENT:  TMs and canals were normal, without erythema or inflammation.  Nasal mucosa was clear and uncongested, without drainage.  Mucous membranes were moist.  Pharynx was clear, without exudate or drainage.  There were no oral ulcerations or lesions. Neck:  Supple, no adenopathy, tenderness or mass. Thyroid was normal. Lungs:  No respiratory distress.  Lungs were clear to auscultation, without wheezes, rales or rhonchi.  Breath sounds were clear and equal bilaterally. Heart:  Regular rhythm, without gallops, murmers or rubs. Abdomen:  Soft, flat, and non-tender to palpation.  No hepatosplenomagaly or mass. Neurological examination: The patient is alert and oriented x3. Speech is clear, fluent, and appropriate. Cranial nerves are intact. There is  no pronator drift and finger to nose was normal. Muscle strength, sensation, and DTRs are normal. Babinskis are downgoing. Station and gait were normal. Romberg sign is negative, patient is able to perform tandem gait well. Extremities: No edema, pulses were full. Joint survey was unremarkable. She has a positive Tinel's and Phalen's sign. Skin:  Clear, warm, and  dry, without rash or lesions.  [facility]   She was placed in a wrist splint on the right wrist.  Assessment   The primary encounter diagnosis was Chronic tension-type headache, not intractable. Diagnoses of Carpal tunnel syndrome of right wrist, Essential hypertension, and Stomatitis were also pertinent to this visit.  I think her headache may be due to analgesia rebound. I have told her not to take any over-the-counter medicine and just take the amitriptyline at bedtime.  Differential diagnosis of the right hand pain and numbness include carpal tunnel syndrome and cervical radiculopathy. I have told her to follow-up with her primary care physician about this in 2 weeks and perform carpal tunnel exercises twice a day. She should wear the wrist splint continuously and only remove it for bathing or showering.  Finally, the soreness in her mouth and tongue appear to be viral. I cannot see anything on looking at her mouth.  Plan     1.  Meds:  The following meds were prescribed:   Discharge Medication List as of 11/19/2014  1:37 PM    START taking these medications   Details  Alum & Mag Hydroxide-Simeth (MAGIC MOUTHWASH) SOLN Take 1 tsp QID, hold in mouth 5 minutes, then swallow, Normal    predniSONE (DELTASONE) 10 MG tablet Take 1 BID for 14 days, then 1 daily for 14 days, Normal       She was given a prescription for amitriptyline 25 mg, #30, one at bedtime for headache, with no refills.  2.  Patient Education/Counseling:  The patient was given appropriate handouts, self care instructions, and instructed in symptomatic relief.  She was instructed to wear the splint continuously for the next 2 weeks and to do the carpal tunnel exercises.  3.  Follow up:  The patient was told to follow up here if no better in 3 to 4 days, or sooner if becoming worse in any way, and given some red flag symptoms such as any new or changing neurological symptoms, fever, or stiff neck  which would prompt immediate return.  Follow up with [facility] clinic in 2 weeks.        Reuben Likesavid C Lakesha Levinson, MD 11/19/14 1435

## 2014-11-19 NOTE — ED Notes (Signed)
Wrist splint applied to pt's right wrist.  Instructions were given through interpreter speaking Arabic [phone].  Discharge instructions given as well.

## 2014-12-21 ENCOUNTER — Ambulatory Visit: Payer: Self-pay

## 2014-12-24 ENCOUNTER — Ambulatory Visit: Payer: Self-pay | Attending: Internal Medicine

## 2014-12-31 ENCOUNTER — Ambulatory Visit: Payer: Self-pay | Attending: Internal Medicine | Admitting: Internal Medicine

## 2014-12-31 ENCOUNTER — Encounter: Payer: Self-pay | Admitting: Internal Medicine

## 2014-12-31 VITALS — BP 142/95 | HR 94 | Temp 98.8°F | Resp 16 | Wt 232.4 lb

## 2014-12-31 DIAGNOSIS — G44229 Chronic tension-type headache, not intractable: Secondary | ICD-10-CM

## 2014-12-31 DIAGNOSIS — I10 Essential (primary) hypertension: Secondary | ICD-10-CM | POA: Insufficient documentation

## 2014-12-31 DIAGNOSIS — Z792 Long term (current) use of antibiotics: Secondary | ICD-10-CM | POA: Insufficient documentation

## 2014-12-31 DIAGNOSIS — R51 Headache: Secondary | ICD-10-CM | POA: Insufficient documentation

## 2014-12-31 DIAGNOSIS — Z7951 Long term (current) use of inhaled steroids: Secondary | ICD-10-CM | POA: Insufficient documentation

## 2014-12-31 DIAGNOSIS — R202 Paresthesia of skin: Secondary | ICD-10-CM | POA: Insufficient documentation

## 2014-12-31 LAB — COMPLETE METABOLIC PANEL WITH GFR
ALK PHOS: 57 U/L (ref 39–117)
ALT: 14 U/L (ref 0–35)
AST: 17 U/L (ref 0–37)
Albumin: 4.2 g/dL (ref 3.5–5.2)
BUN: 12 mg/dL (ref 6–23)
CO2: 23 mEq/L (ref 19–32)
CREATININE: 0.45 mg/dL — AB (ref 0.50–1.10)
Calcium: 9.1 mg/dL (ref 8.4–10.5)
Chloride: 107 mEq/L (ref 96–112)
GFR, Est African American: >89 mL/min
Glucose, Bld: 94 mg/dL (ref 70–99)
POTASSIUM: 4.1 meq/L (ref 3.5–5.3)
Sodium: 136 mEq/L (ref 135–145)
Total Bilirubin: 0.3 mg/dL (ref 0.2–1.2)
Total Protein: 7.3 g/dL (ref 6.0–8.3)

## 2014-12-31 MED ORDER — ATENOLOL 50 MG PO TABS: 50.0000 mg | ORAL_TABLET | Freq: Every day | ORAL | Status: DC

## 2014-12-31 MED ORDER — AMITRIPTYLINE HCL 50 MG PO TABS: ORAL_TABLET | ORAL | Status: DC

## 2014-12-31 NOTE — Progress Notes (Signed)
MRN: [id number] Name: Carolyn Ramsey  Sex: female Age: 39 y.o. DOB: 08/19/1976  Allergies: Review of patient's allergies indicates no known allergies.  Chief Complaint  Patient presents with  . Headache    HPI: Patient is 39 y.o. female who has history of chronic headaches, hypertension, comes today for followup, in December she went to urgent care with a similar symptoms as well as numbness tingling in both hands L>R, EMR reviewed patient was told she possibly has carpal tunnel syndrome, she was given wrist brace which as per patient when she used it helps her with the symptoms, today her blood pressure is borderline elevated, she's requesting refill on the medication, she was prescribed  amitriptyline 25 mg as well as tapering dose of prednisone which she already finished .  Past Medical History  Diagnosis Date  . No pertinent past medical history   . Hypertension     Past Surgical History  Procedure Laterality Date  . No past surgeries        Medication List       This list is accurate as of: 12/31/14 10:38 AM.  Always use your most recent med list.               amitriptyline 50 MG tablet  Commonly known as:  ELAVIL  Take 1 tab every night at bedtime for headache     amoxicillin 875 MG tablet  Commonly known as:  AMOXIL  Take 1 tablet (875 mg total) by mouth 2 (two) times daily.     amoxicillin-clavulanate 875-125 MG per tablet  Commonly known as:  AUGMENTIN  Take 1 tablet by mouth 2 (two) times daily.     atenolol 50 MG tablet  Commonly known as:  TENORMIN  Take 1 tablet (50 mg total) by mouth daily.     cephALEXin 500 MG capsule  Commonly known as:  KEFLEX  Take 1 capsule (500 mg total) by mouth 4 (four) times daily.     cetirizine 10 MG tablet  Commonly known as:  ZYRTEC  Take 1 tablet (10 mg total) by mouth daily.     clindamycin 300 MG capsule  Commonly known as:  CLEOCIN  Take 1 capsule (300 mg total) by mouth 3 (three) times daily.     docusate sodium 100 MG capsule  Commonly known as:  COLACE  Take 1 capsule (100 mg total) by mouth 2 (two) times daily.     fluticasone 50 MCG/ACT nasal spray  Commonly known as:  FLONASE  Place 2 sprays into both nostrils daily.     ibuprofen 600 MG tablet  Commonly known as:  ADVIL,MOTRIN  Take 1 tablet (600 mg total) by mouth every 8 (eight) hours as needed.     magic mouthwash Soln  Take 1 tsp QID, hold in mouth 5 minutes, then swallow     predniSONE 10 MG tablet  Commonly known as:  DELTASONE  Take 1 BID for 14 days, then 1 daily for 14 days     prenatal multivitamin Tabs tablet  Take 1 tablet by mouth every morning.        Meds ordered this encounter  Medications  . amitriptyline (ELAVIL) 50 MG tablet    Sig: Take 1 tab every night at bedtime for headache    Dispense:  30 tablet    Refill:  3  . atenolol (TENORMIN) 50 MG tablet    Sig: Take 1 tablet (50 mg total) by mouth daily.  Dispense:  30 tablet    Refill:  6    Immunization History  Administered Date(s) Administered  . Influenza Split 08/24/2012  . Tdap 09/06/2012    History reviewed. No pertinent family history.  History  Substance Use Topics  . Smoking status: Never Smoker   . Smokeless tobacco: Never Used  . Alcohol Use: No    Review of Systems   As noted in HPI  Filed Vitals:   12/31/14 1011  BP: 142/95  Pulse: 94  Temp: 98.8 F (37.1 C)  Resp: 16    Physical Exam  Physical Exam  Constitutional: No distress.  Eyes: EOM are normal.  Cardiovascular: Normal rate and regular rhythm.   Pulmonary/Chest: Breath sounds normal. No respiratory distress. She has no wheezes. She has no rales.  Musculoskeletal: She exhibits no edema.    CBC    Component Value Date/Time   WBC 6.6 03/13/2014 1126   RBC 4.60 03/13/2014 1126   HGB 11.9* 03/13/2014 1126   HCT 35.1* 03/13/2014 1126   PLT 347 03/13/2014 1126   MCV 76.3* 03/13/2014 1126   LYMPHSABS 2.2 03/13/2014 1126   MONOABS 0.5  03/13/2014 1126   EOSABS 0.1 03/13/2014 1126   BASOSABS 0.1 03/13/2014 1126    CMP     Component Value Date/Time   NA 139 11/08/2014 1628   K 4.1 11/08/2014 1628   CL 106 11/08/2014 1628   CO2 23 11/08/2014 1628   GLUCOSE 102* 11/08/2014 1628   BUN 17 11/08/2014 1628   CREATININE 0.43* 11/08/2014 1628   CALCIUM 9.4 11/08/2014 1628   PROT 7.5 11/08/2014 1628   ALBUMIN 4.1 11/08/2014 1628   AST 14 11/08/2014 1628   ALT 13 11/08/2014 1628   ALKPHOS 64 11/08/2014 1628   BILITOT 0.2 11/08/2014 1628   GFRNONAA >89 11/08/2014 1628   GFRAA >89 11/08/2014 1628    Lab Results  Component Value Date/Time   CHOL 186 07/24/2013 12:08 PM    No components found for: HGA1C  Lab Results  Component Value Date/Time   AST 14 11/08/2014 04:28 PM    Assessment and Plan  Essential hypertension - Plan: advised patient for DASH diet, medication refill done atenolol (TENORMIN) 50 MG tablet, COMPLETE METABOLIC PANEL WITH GFR  Paresthesia of both hands - Plan:  ?CTS ,Ambulatory referral to Neurology for nerve conduction study  Chronic tension-type headache, not intractable - Plan: I have increased the dose of amitriptyline amitriptyline (ELAVIL) 50 MG tablet, Ambulatory referral to Neurology, also ordered her CT Head Wo Contrast  Return in about 3 months (around 03/31/2015) for hypertension.  Doris Cheadle, MD

## 2014-12-31 NOTE — Progress Notes (Signed)
Patient here with interpreter Complains of having numbness to bilateral hands That started this past September Patient also complains of having a headache only to the right side of her head On and off for many months

## 2014-12-31 NOTE — Patient Instructions (Signed)
DASH Eating Plan °DASH stands for "Dietary Approaches to Stop Hypertension." The DASH eating plan is a healthy eating plan that has been shown to reduce high blood pressure (hypertension). Additional health benefits may include reducing the risk of type 2 diabetes mellitus, heart disease, and stroke. The DASH eating plan may also help with weight loss. °WHAT DO I NEED TO KNOW ABOUT THE DASH EATING PLAN? °For the DASH eating plan, you will follow these general guidelines: °· Choose foods with a percent daily value for sodium of less than 5% (as listed on the food label). °· Use salt-free seasonings or herbs instead of table salt or sea salt. °· Check with your health care provider or pharmacist before using salt substitutes. °· Eat lower-sodium products, often labeled as "lower sodium" or "no salt added." °· Eat fresh foods. °· Eat more vegetables, fruits, and low-fat dairy products. °· Choose whole grains. Look for the word "whole" as the first word in the ingredient list. °· Choose fish and skinless chicken or turkey more often than red meat. Limit fish, poultry, and meat to 6 oz (170 g) each day. °· Limit sweets, desserts, sugars, and sugary drinks. °· Choose heart-healthy fats. °· Limit cheese to 1 oz (28 g) per day. °· Eat more home-cooked food and less restaurant, buffet, and fast food. °· Limit fried foods. °· Cook foods using methods other than frying. °· Limit canned vegetables. If you do use them, rinse them well to decrease the sodium. °· When eating at a restaurant, ask that your food be prepared with less salt, or no salt if possible. °WHAT FOODS CAN I EAT? °Seek help from a dietitian for individual calorie needs. °Grains °Whole grain or whole wheat bread. Brown rice. Whole grain or whole wheat pasta. Quinoa, bulgur, and whole grain cereals. Low-sodium cereals. Corn or whole wheat flour tortillas. Whole grain cornbread. Whole grain crackers. Low-sodium crackers. °Vegetables °Fresh or frozen vegetables  (raw, steamed, roasted, or grilled). Low-sodium or reduced-sodium tomato and vegetable juices. Low-sodium or reduced-sodium tomato sauce and paste. Low-sodium or reduced-sodium canned vegetables.  °Fruits °All fresh, canned (in natural juice), or frozen fruits. °Meat and Other Protein Products °Ground beef (85% or leaner), grass-fed beef, or beef trimmed of fat. Skinless chicken or turkey. Ground chicken or turkey. Pork trimmed of fat. All fish and seafood. Eggs. Dried beans, peas, or lentils. Unsalted nuts and seeds. Unsalted canned beans. °Dairy °Low-fat dairy products, such as skim or 1% milk, 2% or reduced-fat cheeses, low-fat ricotta or cottage cheese, or plain low-fat yogurt. Low-sodium or reduced-sodium cheeses. °Fats and Oils °Tub margarines without trans fats. Light or reduced-fat mayonnaise and salad dressings (reduced sodium). Avocado. Safflower, olive, or canola oils. Natural peanut or almond butter. °Other °Unsalted popcorn and pretzels. °The items listed above may not be a complete list of recommended foods or beverages. Contact your dietitian for more options. °WHAT FOODS ARE NOT RECOMMENDED? °Grains °White bread. White pasta. White rice. Refined cornbread. Bagels and croissants. Crackers that contain trans fat. °Vegetables °Creamed or fried vegetables. Vegetables in a cheese sauce. Regular canned vegetables. Regular canned tomato sauce and paste. Regular tomato and vegetable juices. °Fruits °Dried fruits. Canned fruit in light or heavy syrup. Fruit juice. °Meat and Other Protein Products °Fatty cuts of meat. Ribs, chicken wings, bacon, sausage, bologna, salami, chitterlings, fatback, hot dogs, bratwurst, and packaged luncheon meats. Salted nuts and seeds. Canned beans with salt. °Dairy °Whole or 2% milk, cream, half-and-half, and cream cheese. Whole-fat or sweetened yogurt. Full-fat   cheeses or blue cheese. Nondairy creamers and whipped toppings. Processed cheese, cheese spreads, or cheese  curds. °Condiments °Onion and garlic salt, seasoned salt, table salt, and sea salt. Canned and packaged gravies. Worcestershire sauce. Tartar sauce. Barbecue sauce. Teriyaki sauce. Soy sauce, including reduced sodium. Steak sauce. Fish sauce. Oyster sauce. Cocktail sauce. Horseradish. Ketchup and mustard. Meat flavorings and tenderizers. Bouillon cubes. Hot sauce. Tabasco sauce. Marinades. Taco seasonings. Relishes. °Fats and Oils °Butter, stick margarine, lard, shortening, ghee, and bacon fat. Coconut, palm kernel, or palm oils. Regular salad dressings. °Other °Pickles and olives. Salted popcorn and pretzels. °The items listed above may not be a complete list of foods and beverages to avoid. Contact your dietitian for more information. °WHERE CAN I FIND MORE INFORMATION? °[employer]: www.nhlbi.nih.gov/health/health-topics/topics/dash/ °Document Released: 11/05/2011 Document Revised: 04/02/2014 Document Reviewed: 09/20/2013 °[employer]® Patient Information ©2015 [employer]. This information is not intended to replace advice given to you by your health care provider. Make sure you discuss any questions you have with your health care provider. ° °

## 2015-01-03 ENCOUNTER — Ambulatory Visit (HOSPITAL_COMMUNITY)
Admission: RE | Admit: 2015-01-03 | Discharge: 2015-01-03 | Disposition: A | Discharge status: Home / Self Care | Payer: Self-pay | Source: Ambulatory Visit | Attending: Internal Medicine | Admitting: Internal Medicine

## 2015-01-03 DIAGNOSIS — G44229 Chronic tension-type headache, not intractable: Secondary | ICD-10-CM | POA: Insufficient documentation

## 2015-01-04 ENCOUNTER — Telehealth: Payer: Self-pay

## 2015-01-04 NOTE — Telephone Encounter (Signed)
-----   Message from Doris Cheadleeepak Advani, MD sent at 01/04/2015  9:20 AM EST ----- Call and let the patient know that her CT head is reported to be normal.

## 2015-01-04 NOTE — Telephone Encounter (Signed)
-----   Message from Deepak Advani, MD sent at 01/04/2015  9:20 AM EST ----- Call and let the patient know that her CT head is reported to be normal. 

## 2015-01-04 NOTE — Telephone Encounter (Signed)
[employer] interpreted [phone]#[id number]

## 2015-01-04 NOTE — Telephone Encounter (Signed)
[employer] interpreted [phone]#[id number] Pt aware of CT results

## 2015-01-13 ENCOUNTER — Other Ambulatory Visit: Payer: Self-pay | Admitting: Internal Medicine

## 2015-01-29 ENCOUNTER — Ambulatory Visit (INDEPENDENT_AMBULATORY_CARE_PROVIDER_SITE_OTHER): Payer: Self-pay | Admitting: Neurology

## 2015-01-29 ENCOUNTER — Encounter: Payer: Self-pay | Admitting: Neurology

## 2015-01-29 VITALS — BP 130/86 | HR 100 | Ht 66.0 in | Wt 232.2 lb

## 2015-01-29 DIAGNOSIS — G5602 Carpal tunnel syndrome, left upper limb: Secondary | ICD-10-CM

## 2015-01-29 DIAGNOSIS — G5603 Carpal tunnel syndrome, bilateral upper limbs: Secondary | ICD-10-CM

## 2015-01-29 DIAGNOSIS — M6248 Contracture of muscle, other site: Secondary | ICD-10-CM

## 2015-01-29 DIAGNOSIS — M62838 Other muscle spasm: Secondary | ICD-10-CM

## 2015-01-29 DIAGNOSIS — R51 Headache: Secondary | ICD-10-CM

## 2015-01-29 DIAGNOSIS — R519 Headache, unspecified: Secondary | ICD-10-CM

## 2015-01-29 DIAGNOSIS — G5601 Carpal tunnel syndrome, right upper limb: Secondary | ICD-10-CM

## 2015-01-29 MED ORDER — CYCLOBENZAPRINE HCL 5 MG PO TABS: 5.0000 mg | ORAL_TABLET | Freq: Every evening | ORAL | Status: DC | PRN

## 2015-01-29 MED ORDER — NAPROXEN 500 MG PO TABS: 500.0000 mg | ORAL_TABLET | Freq: Two times a day (BID) | ORAL | Status: DC | PRN

## 2015-01-29 NOTE — Progress Notes (Signed)
[facility] Neurology [facility] Clinic Note - Initial Visit   Date: 01/29/2015   Carolyn Giovannibttsam Eastridge MRN: [id number] DOB: 06/18/1976   Dear Dr. Orpah CobbAdvani:   Thank you for your kind referral of Carolyn Giovannibttsam Ballman for consultation of headaches and numbness of the hands. Although her history is well known to you, please allow us to reiterate it for the purpose of our medical record. The patient was accompanied to the clinic by Arabic interpretor who also provides collateral information.     History of Present Illness: Carolyn Ramsey is a 39 y.o. right-handed [place] female (Arabic-speaking) with chronic headaches presenting for evaluation of bilateral hand numbness.    She went to emergency department care in December 2015 with complaints of headaches, numbness of the hands, sore throat, and elevated blood pressure.  Headaches have been ongoing since 2014 and recently have become daily.  Pain is bitemporal and a deep dull, steady ache. She gets relief with Tylenol and aspirin. She takes at least 2 of these every day. Duration is several hours.   Headaches occur almost every day, sometimes several times a day. No associated with nausea, photophobia, or phonophobia. She denies any visual symptoms, stiff neck, difficulty with speech, or ambulation.  She was started on amitriptyline 25mg  in the ED which has helped.  It was further increased to 50mg  qhs which provided even more benefit.  Headaches are much improved over the past two weeks, but she noticed left neck pain.  She is tender to touch over the left neck and pain is worse with neck motion.  She has not tried ibuprofen or tylenol.     In December 2015, she developed numbness and tingling involving the first three digits on the right which steadily worsened.  This was followed by right forearm pain.  She had improvement with steroid and wrist splint, but a month later, she developed similar symptoms on the left hand.  She has been using a wrist  splint on the left, which helps.   Out-side paper records, electronic medical record, and images have been reviewed where available and summarized as:  CT head 01/03/2015:  Normal  Lab Results  Component Value Date   TSH 0.710 03/13/2014   Lab Results  Component Value Date   HGBA1C 6.3* 07/24/2013     Past Medical History  Diagnosis Date  . No pertinent past medical history   . Hypertension     Past Surgical History  Procedure Laterality Date  . No past surgeries       Medications:  Current Outpatient Prescriptions on File Prior to Visit  Medication Sig Dispense Refill  . amitriptyline (ELAVIL) 50 MG tablet Take 1 tab every night at bedtime for headache 30 tablet 3  . atenolol (TENORMIN) 50 MG tablet Take 1 tablet (50 mg total) by mouth daily. 30 tablet 6  . ibuprofen (ADVIL,MOTRIN) 600 MG tablet Take 1 tablet (600 mg total) by mouth every 8 (eight) hours as needed. 30 tablet 1   No current facility-administered medications on file prior to visit.    Allergies: No Known Allergies  Family History: Notable for hypertension in siblings.  Social History: History   Social History  . Marital Status: Married    Spouse Name: N/A  . Number of Children: N/A  . Years of Education: N/A   Occupational History  . Not on file.   Social History Main Topics  . Smoking status: Never Smoker   . Smokeless tobacco: Never Used  . Alcohol Use:  No  . Drug Use: No  . Sexual Activity: Yes    Birth Control/ Protection: IUD   Other Topics Concern  . Not on file   Social History Narrative   Lives with husband and 4 children.  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     Review of Systems:  CONSTITUTIONAL: No fevers, chills, night sweats, or weight loss.   EYES: No visual changes or eye pain ENT: No hearing changes.  No history of nose bleeds.   RESPIRATORY: No cough, wheezing and shortness of breath.   CARDIOVASCULAR: Negative for chest pain, and  palpitations.   GI: Negative for abdominal discomfort, blood in stools or black stools.  No recent change in bowel habits.   GU:  No history of incontinence.   MUSCLOSKELETAL: No history of joint pain or swelling.  No myalgias.   SKIN: Negative for lesions, rash, and itching.   HEMATOLOGY/ONCOLOGY: Negative for prolonged bleeding, bruising easily, and swollen nodes.  No history of cancer.   ENDOCRINE: Negative for cold or heat intolerance, polydipsia or goiter.   PSYCH:  +depression or anxiety symptoms.   NEURO: As Above.   Vital Signs:  BP 130/86 mmHg  Pulse 100  Ht  (1.676 m)  Wt 232 lb 4 oz (105.348 kg)  BMI 37.50 kg/m2  SpO2 99%  LMP 12/18/2014 (Exact Date)   General Medical Exam:   General:  Well appearing, comfortable.   Eyes/ENT: see cranial nerve examination.   Neck: No masses appreciated.  No carotid bruits.  Marked tenderness and tension over left cervical muscles.  Respiratory:  Clear to auscultation, good air entry bilaterally.   Cardiac:  Regular rate and rhythm, no murmur.   Extremities:  No deformities, edema, or skin discoloration.  Skin:  No rashes or lesions.  Neurological Exam: MENTAL STATUS including orientation to time, place, person, recent and remote memory, attention span and concentration, language, and fund of knowledge is normal.  Arabic speaking.  Speech is not dysarthric.  CRANIAL NERVES: II:  No visual field defects.  Unremarkable fundi.   III-IV-VI: Pupils equal round and reactive to light.  Normal conjugate, extra-ocular eye movements in all directions of gaze.  No nystagmus.  No ptosis.   V:  Normal facial sensation.     VII:  Normal facial symmetry and movements.  VIII:  Normal hearing and vestibular function.   IX-X:  Normal palatal movement.   XI:  Normal shoulder shrug and head rotation.   XII:  Normal tongue strength and range of motion, no deviation or fasciculation.  MOTOR:  No atrophy, fasciculations or abnormal movements.  No  pronator drift.  Tone is normal.  Tinel's positive at left wrist.  Right Upper Extremity:    Left Upper Extremity:    Deltoid  5/5   Deltoid  5/5   Biceps  5/5   Biceps  5/5   Triceps  5/5   Triceps  5/5   Wrist extensors  5/5   Wrist extensors  5/5   Wrist flexors  5/5   Wrist flexors  5/5   Finger extensors  5/5   Finger extensors  5/5   Finger flexors  5/5   Finger flexors  5/5   Dorsal interossei  5/5   Dorsal interossei  5/5   Abductor pollicis  5/5   Abductor pollicis  5/5   Tone (Ashworth scale)  0  Tone (Ashworth scale)  0   Right Lower Extremity:  Left Lower Extremity:    Hip flexors  5/5   Hip flexors  5/5   Hip extensors  5/5   Hip extensors  5/5   Knee flexors  5/5   Knee flexors  5/5   Knee extensors  5/5   Knee extensors  5/5   Dorsiflexors  5/5   Dorsiflexors  5/5   Plantarflexors  5/5   Plantarflexors  5/5   Toe extensors  5/5   Toe extensors  5/5   Toe flexors  5/5   Toe flexors  5/5   Tone (Ashworth scale)  0  Tone (Ashworth scale)  0   MSRs:  Right                                                                 Left brachioradialis 2+  brachioradialis 2+  biceps 2+  biceps 2+  triceps 2+  triceps 2+  patellar 2+  patellar 2+  ankle jerk 2+  ankle jerk 2+  Hoffman no  Hoffman no  plantar response down  plantar response down   SENSORY:  Normal and symmetric perception of light touch, pinprick, vibration, and proprioception.  Romberg's sign absent.   COORDINATION/GAIT: Normal finger-to- nose-finger and heel-to-shin.  Intact rapid alternating movements bilaterally.  Gait narrow based and stable.   IMPRESSION: 1.  Cervical muscle spasm and strain  - Start naprosyn  BID prn pain   - Start flexeril  qhs prn   2.  Bilateral CTS, clinically improved since using wrist splint  - No motor or sensory deficits on exam, so suspect this is mild  - Continue to use wrist splint  - Strategies to minimize trauma to the wrist discussed including avoidance of  wrist flexion  - If symptoms worsen, next step will be EMG  3.  Chronic daily headaches, improved   - Continue amitriptyline  qhs  Return to clinic as needed    The duration of this appointment visit was 45 minutes of face-to-face time with the patient.  Greater than 50% of this time was spent in counseling, explanation of diagnosis, planning of further management, and coordination of care.   Thank you for allowing me to participate in patient's care.  If I can answer any additional questions, I would be pleased to do so.    Sincerely,    Donika K. Allena Katz, DO

## 2015-01-29 NOTE — Patient Instructions (Addendum)
1.  For neck pain, start taking naprosyn 500mg  twice daily as needed for pain.  You can also try cyclobenzaprine 5mg  at bedtime as needed. 2.  Continue to use wrist splint.  Avoid flexion of the hands 3.  Continue amitriptyline 50mg  at bedtime

## 2015-06-18 ENCOUNTER — Ambulatory Visit: Payer: Self-pay | Attending: Internal Medicine | Admitting: Internal Medicine

## 2015-06-18 ENCOUNTER — Encounter: Payer: Self-pay | Admitting: Internal Medicine

## 2015-06-18 VITALS — BP 134/88 | HR 96 | Temp 98.0°F | Resp 16 | Wt 225.6 lb

## 2015-06-18 DIAGNOSIS — Z79899 Other long term (current) drug therapy: Secondary | ICD-10-CM | POA: Insufficient documentation

## 2015-06-18 DIAGNOSIS — I10 Essential (primary) hypertension: Secondary | ICD-10-CM

## 2015-06-18 DIAGNOSIS — J069 Acute upper respiratory infection, unspecified: Secondary | ICD-10-CM

## 2015-06-18 DIAGNOSIS — M79674 Pain in right toe(s): Secondary | ICD-10-CM

## 2015-06-18 DIAGNOSIS — R51 Headache: Secondary | ICD-10-CM | POA: Insufficient documentation

## 2015-06-18 LAB — COMPLETE METABOLIC PANEL WITH GFR
ALBUMIN: 3.8 g/dL (ref 3.5–5.2)
ALT: 11 U/L (ref 0–35)
AST: 13 U/L (ref 0–37)
Alkaline Phosphatase: 60 U/L (ref 39–117)
BILIRUBIN TOTAL: 0.3 mg/dL (ref 0.2–1.2)
BUN: 16 mg/dL (ref 6–23)
CO2: 23 mEq/L (ref 19–32)
Calcium: 9.3 mg/dL (ref 8.4–10.5)
Chloride: 106 mEq/L (ref 96–112)
Creat: 0.33 mg/dL — ABNORMAL LOW (ref 0.50–1.10)
GFR, Est Non African American: >89 mL/min
Glucose, Bld: 93 mg/dL (ref 70–99)
POTASSIUM: 4 meq/L (ref 3.5–5.3)
Sodium: 140 mEq/L (ref 135–145)
Total Protein: 7.4 g/dL (ref 6.0–8.3)

## 2015-06-18 LAB — URIC ACID: Uric Acid, Serum: 3.6 mg/dL (ref 2.4–7.0)

## 2015-06-18 MED ORDER — AZITHROMYCIN 250 MG PO TABS: ORAL_TABLET | ORAL | Status: DC

## 2015-06-18 MED ORDER — ATENOLOL 50 MG PO TABS: 50.0000 mg | ORAL_TABLET | Freq: Every day | ORAL | Status: DC

## 2015-06-18 MED ORDER — IBUPROFEN 600 MG PO TABS: 600.0000 mg | ORAL_TABLET | Freq: Three times a day (TID) | ORAL | Status: DC | PRN

## 2015-06-18 NOTE — Progress Notes (Signed)
MRN: [id number] Name: Carolyn Ramsey  Sex: female Age: 39 y.o. DOB: May 17, 1976  Allergies: Review of patient's allergies indicates no known allergies.  Chief Complaint  Patient presents with  . Foot Pain    HPI: Patient is 39 y.o. female who has to of chronic headaches, hypertension comes today for followup requesting refill on her blood pressure medication, today her major concern is right foot to pain for the last 2 weeks as per patient she tripped and injured her right foot, she has not taken any pain medication, she is also complaining of URI symptoms fever chills sore throat, dry cough on and off for the one week, denies any chest pain or shortness of breath.  Past Medical History  Diagnosis Date  . No pertinent past medical history   . Hypertension     Past Surgical History  Procedure Laterality Date  . No past surgeries        Medication List       This list is accurate as of: 06/18/15 11:08 AM.  Always use your most recent med list.               amitriptyline 50 MG tablet  Commonly known as:  ELAVIL  Take 1 tab every night at bedtime for headache     atenolol 50 MG tablet  Commonly known as:  TENORMIN  Take 1 tablet (50 mg total) by mouth daily.     azithromycin 250 MG tablet  Commonly known as:  ZITHROMAX Z-PAK  Take as directed     cyclobenzaprine 5 MG tablet  Commonly known as:  FLEXERIL  Take 1 tablet (5 mg total) by mouth at bedtime as needed for muscle spasms.     ibuprofen 600 MG tablet  Commonly known as:  ADVIL,MOTRIN  Take 1 tablet (600 mg total) by mouth every 8 (eight) hours as needed.     naproxen 500 MG tablet  Commonly known as:  NAPROSYN  Take 1 tablet (500 mg total) by mouth every 12 (twelve) hours as needed for moderate pain.        Meds ordered this encounter  Medications  . atenolol (TENORMIN) 50 MG tablet    Sig: Take 1 tablet (50 mg total) by mouth daily.    Dispense:  30 tablet    Refill:  6  . ibuprofen  (ADVIL,MOTRIN) 600 MG tablet    Sig: Take 1 tablet (600 mg total) by mouth every 8 (eight) hours as needed.    Dispense:  30 tablet    Refill:  1  . azithromycin (ZITHROMAX Z-PAK) 250 MG tablet    Sig: Take as directed    Dispense:  6 each    Refill:  0    Immunization History  Administered Date(s) Administered  . Influenza Split 08/24/2012  . Tdap 09/06/2012    History reviewed. No pertinent family history.  History  Substance Use Topics  . Smoking status: Never Smoker   . Smokeless tobacco: Never Used  . Alcohol Use: No    Review of Systems   As noted in HPI  Filed Vitals:   06/18/15 1023  BP: 134/88  Pulse: 96  Temp: 98 F (36.7 C)  Resp: 16    Physical Exam  Physical Exam  HENT:  Head: Normocephalic and atraumatic.  Pharyngeal erythema no exudate.  Eyes: EOM are normal. Pupils are equal, round, and reactive to light.  Cardiovascular: Normal rate and regular rhythm.   Pulmonary/Chest: Breath sounds normal.  No respiratory distress. She has no wheezes. She has no rales.  Musculoskeletal:  Right foot tenderness on the first metatarsophalangeal joint, minimal swelling  Lymphadenopathy:    She has cervical adenopathy.    CBC    Component Value Date/Time   WBC 6.6 03/13/2014 1126   RBC 4.60 03/13/2014 1126   HGB 11.9* 03/13/2014 1126   HCT 35.1* 03/13/2014 1126   PLT 347 03/13/2014 1126   MCV 76.3* 03/13/2014 1126   LYMPHSABS 2.2 03/13/2014 1126   MONOABS 0.5 03/13/2014 1126   EOSABS 0.1 03/13/2014 1126   BASOSABS 0.1 03/13/2014 1126    CMP     Component Value Date/Time   NA 136 12/31/2014 1041   K 4.1 12/31/2014 1041   CL 107 12/31/2014 1041   CO2 23 12/31/2014 1041   GLUCOSE 94 12/31/2014 1041   BUN 12 12/31/2014 1041   CREATININE 0.45* 12/31/2014 1041   CALCIUM 9.1 12/31/2014 1041   PROT 7.3 12/31/2014 1041   ALBUMIN 4.2 12/31/2014 1041   AST 17 12/31/2014 1041   ALT 14 12/31/2014 1041   ALKPHOS 57 12/31/2014 1041   BILITOT 0.3  12/31/2014 1041   GFRNONAA >89 12/31/2014 1041   GFRAA >89 12/31/2014 1041    Lab Results  Component Value Date/Time   CHOL 186 07/24/2013 12:08 PM    Lab Results  Component Value Date/Time   HGBA1C 6.3* 07/24/2013 12:08 PM    Lab Results  Component Value Date/Time   AST 17 12/31/2014 10:41 AM    Assessment and Plan  Pain of toe of right foot - Plan: ibuprofen (ADVIL,MOTRIN) 600 MG tablet, DG Foot Complete Right, Uric Acid  Essential hypertension - Plan: atenolol (TENORMIN) 50 MG tablet, COMPLETE METABOLIC PANEL WITH GFR  URI (upper respiratory infection) - Plan: azithromycin (ZITHROMAX Z-PAK) 250 MG tablet   Return in about 3 months (around 09/18/2015), or if symptoms worsen or fail to improve.   This note has been created with Education officer, [employer]Dragon speech recognition software and smart phrase technology. Any transcriptional errors are unintentional.    Doris CheadleADVANI, Johncarlo Maalouf, MD

## 2015-06-18 NOTE — Progress Notes (Signed)
Interpreter line used Carolyn Ramsey ID # [id number] Patient complains of body aches, leg pain and fever for the past couple of weeks And some mouth sores on her tongue Patient also states she tripped a few weeks back and injured her right foot Has some pain and swelling around her great to on the right foot

## 2015-06-19 ENCOUNTER — Telehealth: Payer: Self-pay

## 2015-06-19 NOTE — Telephone Encounter (Signed)
Interpreter line used ABdu id # [id number] Patient not available Message left on voice mail to return our call

## 2015-06-19 NOTE — Telephone Encounter (Signed)
-----   Message from Carolyn Cheadleeepak Advani, MD sent at 06/19/2015 10:04 AM EDT ----- Call and let the Patient know that blood work is normal.

## 2015-07-16 ENCOUNTER — Ambulatory Visit: Payer: Self-pay

## 2015-07-25 ENCOUNTER — Ambulatory Visit: Payer: Self-pay

## 2015-10-25 ENCOUNTER — Encounter (HOSPITAL_COMMUNITY): Payer: Self-pay | Admitting: Emergency Medicine

## 2015-10-25 ENCOUNTER — Emergency Department (INDEPENDENT_AMBULATORY_CARE_PROVIDER_SITE_OTHER)
Admission: EM | Admit: 2015-10-25 | Discharge: 2015-10-25 | Disposition: A | Discharge status: Home / Self Care | Payer: PRIVATE HEALTH INSURANCE | Source: Home / Self Care | Attending: Family Medicine | Admitting: Family Medicine

## 2015-10-25 DIAGNOSIS — G5602 Carpal tunnel syndrome, left upper limb: Secondary | ICD-10-CM | POA: Diagnosis not present

## 2015-10-25 DIAGNOSIS — J069 Acute upper respiratory infection, unspecified: Secondary | ICD-10-CM | POA: Diagnosis not present

## 2015-10-25 MED ORDER — MELOXICAM 7.5 MG PO TABS: 7.5000 mg | ORAL_TABLET | Freq: Two times a day (BID) | ORAL | Status: DC

## 2015-10-25 MED ORDER — IPRATROPIUM BROMIDE 0.06 % NA SOLN: 2.0000 | Freq: Four times a day (QID) | NASAL | Status: DC

## 2015-10-25 NOTE — ED Provider Notes (Signed)
CSN: [id number]     Arrival date & time 10/25/15  1403 History   First MD Initiated Contact with Patient 10/25/15 1608     Chief Complaint  Patient presents with  . Hand Pain   (Consider location/radiation/quality/duration/timing/severity/associated sxs/prior Treatment) Patient is a 39 y.o. female presenting with hand pain. The history is provided by the patient. The history is limited by a language barrier. A language interpreter was used (57 yo son trans very well.).  Hand Pain This is a new problem. The current episode started more than 2 days ago. The problem has been gradually worsening. Associated symptoms comments: Similar on right wrist 1 yr ago, .    Past Medical History  Diagnosis Date  . No pertinent past medical history   . Hypertension    Past Surgical History  Procedure Laterality Date  . No past surgeries     History reviewed. No pertinent family history. Social History  Substance Use Topics  . Smoking status: Never Smoker   . Smokeless tobacco: Never Used  . Alcohol Use: No   OB History    Gravida Para Term Preterm AB TAB SAB Ectopic Multiple Living   0 0 0 0 0 0 4     Review of Systems  Constitutional: Negative.   Musculoskeletal: Positive for myalgias and joint swelling.  Skin: Negative.   All other systems reviewed and are negative.   Allergies  Review of patient's allergies indicates no known allergies.  Home Medications   Prior to Admission medications   Medication Sig Start Date End Date Taking? Authorizing Provider  amitriptyline (ELAVIL) 50 MG tablet Take 1 tab every night at bedtime for headache Patient not taking: Reported on 10/25/2015 12/31/14   Doris Cheadle, MD  atenolol (TENORMIN) 50 MG tablet Take 1 tablet (50 mg total) by mouth daily. 06/18/15   Doris Cheadle, MD  azithromycin (ZITHROMAX Z-PAK) 250 MG tablet Take as directed Patient not taking: Reported on 10/25/2015 06/18/15   Doris Cheadle, MD  cyclobenzaprine (FLEXERIL) 5 MG  tablet Take 1 tablet (5 mg total) by mouth at bedtime as needed for muscle spasms. Patient not taking: Reported on 10/25/2015 01/29/15   Glendale Chard, DO  ibuprofen (ADVIL,MOTRIN) 600 MG tablet Take 1 tablet (600 mg total) by mouth every 8 (eight) hours as needed. Patient not taking: Reported on 10/25/2015 06/18/15   Doris Cheadle, MD  ipratropium (ATROVENT) 0.06 % nasal spray Place 2 sprays into both nostrils 4 (four) times daily. 10/25/15   Linna Hoff, MD  meloxicam (MOBIC) 7.5 MG tablet Take 1 tablet (7.5 mg total) by mouth 2 (two) times daily after a meal. 10/25/15   Linna Hoff, MD  naproxen (NAPROSYN) 500 MG tablet Take 1 tablet (500 mg total) by mouth every 12 (twelve) hours as needed for moderate pain. Patient not taking: Reported on 10/25/2015 01/29/15   Glendale Chard, DO   Meds Ordered and Administered this Visit  Medications - No data to display  There were no vitals taken for this visit. No data found.   Physical Exam  Constitutional: She is oriented to person, place, and time. She appears well-developed and well-nourished. No distress.  Musculoskeletal: She exhibits tenderness.  Median n distrib worse at night with sleeping.  Neurological: She is alert and oriented to person, place, and time.  Skin: Skin is warm and dry.  Nursing note and vitals reviewed.   ED Course  Procedures (including critical care time)  Labs Review  Labs Reviewed - No data to display  Imaging Review No results found.   Visual Acuity Review  Right Eye Distance:   Left Eye Distance:   Bilateral Distance:    Right Eye Near:   Left Eye Near:    Bilateral Near:         MDM   1. Carpal tunnel syndrome on left   2. URI (upper respiratory infection)        Linna HoffJames D Rola Lennon, MD 10/25/15 [phone]

## 2015-10-25 NOTE — ED Notes (Signed)
Bilateral hand pain  right side of head pain with cough, sneezing

## 2015-11-14 ENCOUNTER — Encounter: Payer: Self-pay | Admitting: Family Medicine

## 2015-11-14 ENCOUNTER — Ambulatory Visit: Payer: PRIVATE HEALTH INSURANCE | Attending: Family Medicine | Admitting: Family Medicine

## 2015-11-14 VITALS — BP 139/82 | HR 102 | Temp 97.9°F | Resp 13 | Ht 66.5 in | Wt 236.0 lb

## 2015-11-14 DIAGNOSIS — Z79899 Other long term (current) drug therapy: Secondary | ICD-10-CM | POA: Insufficient documentation

## 2015-11-14 DIAGNOSIS — G5600 Carpal tunnel syndrome, unspecified upper limb: Secondary | ICD-10-CM | POA: Insufficient documentation

## 2015-11-14 DIAGNOSIS — G5602 Carpal tunnel syndrome, left upper limb: Secondary | ICD-10-CM | POA: Diagnosis not present

## 2015-11-14 DIAGNOSIS — M25532 Pain in left wrist: Secondary | ICD-10-CM | POA: Insufficient documentation

## 2015-11-14 DIAGNOSIS — I10 Essential (primary) hypertension: Secondary | ICD-10-CM | POA: Diagnosis not present

## 2015-11-14 MED ORDER — ATENOLOL 50 MG PO TABS: 50.0000 mg | ORAL_TABLET | Freq: Every day | ORAL | Status: DC

## 2015-11-14 MED ORDER — PREDNISONE 20 MG PO TABS: 20.0000 mg | ORAL_TABLET | Freq: Every day | ORAL | Status: DC

## 2015-11-14 MED ORDER — MELOXICAM 7.5 MG PO TABS: 7.5000 mg | ORAL_TABLET | Freq: Every day | ORAL | Status: DC

## 2015-11-14 NOTE — Progress Notes (Signed)
[place] interpreter used ID #[id number]  Patient here to discuss left arm pain She reports pain at 10/10 constant pain that keeps her from sleeping She also complains of headaches She is asking for refills on her atenolol

## 2015-11-14 NOTE — Progress Notes (Signed)
CC: Here to establish care  HPI: Carolyn Ramsey is a 39 y.o. female with a history of Hypertension who comes in here to establish care.  Complains of left wrist pain which radiates to her elbow and fingers and has been on for the last  One month; was placed on NSAIDS at an urgent care which have not helped. She complains of numbness in all the fingers of the right hand Patient has No headache, No chest pain, No abdominal pain - No Nausea, No new weakness tingling or numbness, No Cough - SOB.  No Known Allergies Past Medical History  Diagnosis Date  . No pertinent past medical history   . Hypertension    Current Outpatient Prescriptions on File Prior to Visit  Medication Sig Dispense Refill  . amitriptyline (ELAVIL) 50 MG tablet Take 1 tab every night at bedtime for headache (Patient not taking: Reported on 10/25/2015) 30 tablet 3  . atenolol (TENORMIN) 50 MG tablet Take 1 tablet (50 mg total) by mouth daily. 30 tablet 6  . azithromycin (ZITHROMAX Z-PAK) 250 MG tablet Take as directed (Patient not taking: Reported on 10/25/2015) 6 each 0  . cyclobenzaprine (FLEXERIL) 5 MG tablet Take 1 tablet (5 mg total) by mouth at bedtime as needed for muscle spasms. (Patient not taking: Reported on 10/25/2015) 20 tablet 0  . ibuprofen (ADVIL,MOTRIN) 600 MG tablet Take 1 tablet (600 mg total) by mouth every 8 (eight) hours as needed. (Patient not taking: Reported on 10/25/2015) 30 tablet 1  . ipratropium (ATROVENT) 0.06 % nasal spray Place 2 sprays into both nostrils 4 (four) times daily. 15 mL 1  . meloxicam (MOBIC) 7.5 MG tablet Take 1 tablet (7.5 mg total) by mouth 2 (two) times daily after a meal. 30 tablet 1  . naproxen (NAPROSYN) 500 MG tablet Take 1 tablet (500 mg total) by mouth every 12 (twelve) hours as needed for moderate pain. (Patient not taking: Reported on 10/25/2015) 30 tablet 0   No current facility-administered medications on file prior to visit.   No family history on  file. Social History   Social History  . Marital Status: Married    Spouse Name: N/A  . Number of Children: N/A  . Years of Education: N/A   Occupational History  . Not on file.   Social History Main Topics  . Smoking status: Never Smoker   . Smokeless tobacco: Never Used  . Alcohol Use: No  . Drug Use: No  . Sexual Activity: Yes    Birth Control/ Protection: IUD   Other Topics Concern  . Not on file   Social History Narrative   Lives with husband and 4 children.  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     Review of Systems: Constitutional: Negative for fever, chills, diaphoresis, activity change, appetite change and fatigue. HENT: Negative for ear pain, nosebleeds, congestion, facial swelling, rhinorrhea, neck pain, neck stiffness and ear discharge.  Eyes: Negative for pain, discharge, redness, itching and visual disturbance. Respiratory: Negative for cough, choking, chest tightness, shortness of breath, wheezing and stridor.  Cardiovascular: Negative for chest pain, palpitations and leg swelling. Gastrointestinal: Negative for abdominal distention. Genitourinary: Negative for dysuria, urgency, frequency, hematuria, flank pain, decreased urine volume, difficulty urinating and dyspareunia.  Musculoskeletal: see hpi Neurological: Negative for dizziness, tremors, seizures, syncope, facial asymmetry, speech difficulty, weakness, light-headedness, numbness and headaches.  Hematological: Negative for adenopathy. Does not bruise/bleed easily. Psychiatric/Behavioral: Negative for hallucinations, behavioral problems, confusion, dysphoric mood, decreased concentration  and agitation.    Objective: Filed Vitals:   11/14/15 1012  BP: 139/82  Pulse: 102  Temp: 97.9 F (36.6 C)  Resp: 13  Height: 5' 6.5" (1.689 m)  Weight: 236 lb (107.049 kg)  SpO2: 98%       Physical Exam: Constitutional: Patient appears well-developed and well-nourished. No  distress. HENT: Normocephalic, atraumatic, External right and left ear normal. Oropharynx is clear and moist.  Eyes: Conjunctivae and EOM are normal. PERRLA, no scleral icterus. Neck: Normal ROM. Neck supple. No JVD. No tracheal deviation. No thyromegaly. CVS: RRR, S1/S2 +, no murmurs, no gallops, no carotid bruit.  Pulmonary: Effort and breath sounds normal, no stridor, rhonchi, wheezes, rales.  Abdominal: Soft. BS +,  no distension, tenderness, rebound or guarding.  Musculoskeletal: tenderness in left wrist, positive tinel's and phalen's sgn Lymphadenopathy: No lymphadenopathy noted, cervical, inguinal or axillary Neuro: Alert. Normal reflexes, muscle tone coordination. No cranial nerve deficit. Skin: Skin is warm and dry. No rash noted. Not diaphoretic. No erythema. No pallor. Psychiatric: Normal mood and affect. Behavior, judgment, thought content normal.  Lab Results  Component Value Date   WBC 6.6 03/13/2014   HGB 11.9* 03/13/2014   HCT 35.1* 03/13/2014   MCV 76.3* 03/13/2014   PLT 347 03/13/2014   Lab Results  Component Value Date   CREATININE 0.33* 06/18/2015   BUN 16 06/18/2015   NA 140 06/18/2015   K 4.0 06/18/2015   CL 106 06/18/2015   CO2 23 06/18/2015    Lab Results  Component Value Date   HGBA1C 6.3* 07/24/2013   Lipid Panel     Component Value Date/Time   CHOL 186 07/24/2013 1208   TRIG 64 07/24/2013 1208   HDL 62 07/24/2013 1208   CHOLHDL 3.0 07/24/2013 1208   VLDL 13 07/24/2013 1208   LDLCALC 111* 07/24/2013 1208       Assessment and plan:  Hypertension: Controlled. Continue atenolol     Carpal tunnel syndrome: Patient has a wrist brace at home which I have advised her to use. I will place a short course prednisone after which she will commence NSAIDs If symptoms persist she may be referred for cortisone injections or release surgery.  This note has been created with Education officer, environmental. Any  transcriptional errors are unintentional.     Jaclyn Shaggy, MD. [facility] [phone] 11/14/2015, 10:12 AM

## 2015-11-14 NOTE — Patient Instructions (Signed)
Carpal Tunnel Release  Carpal tunnel release is a surgical procedure to relieve numbness and pain in your hand that are caused by carpal tunnel syndrome. Your carpal tunnel is a narrow, hollow space in your wrist. It passes between your wrist bones and a band of connective tissue (transverse carpal ligament). The nerve that supplies most of your hand (median nerve) passes through this space, and so do the connections between your fingers and the muscles of your arm (tendons). Carpal tunnel syndrome makes this space swell and become narrow, and this causes pain and numbness.  In carpal tunnel release surgery, a surgeon cuts through the transverse carpal ligament to make more room in the carpal tunnel space. You may have this surgery if other types of treatment have not worked.  LET YOUR HEALTH CARE PROVIDER KNOW ABOUT:  · Any allergies you have.  · All medicines you are taking, including vitamins, herbs, eye drops, creams, and over-the-counter medicines.  · Previous problems you or members of your family have had with the use of anesthetics.  · Any blood disorders you have.  · Previous surgeries you have had.  · Medical conditions you have.  RISKS AND COMPLICATIONS  Generally, this is a safe procedure. However, problems may occur, including:  · Bleeding.  · Infection.  · Injury to the median nerve.  · Need for additional surgery.  BEFORE THE PROCEDURE  · Ask your health care provider about:    Changing or stopping your regular medicines. This is especially important if you are taking diabetes medicines or blood thinners.    Taking medicines such as aspirin and ibuprofen. These medicines can thin your blood. Do not take these medicines before your procedure if your health care provider instructs you not to.  · Do not eat or drink anything after midnight on the night before the procedure or as directed by your health care provider.  · Plan to have someone take you home after the procedure.  PROCEDURE  · An IV tube may  be inserted into a vein.  · You will be given one of the following:    A medicine that numbs the wrist area (local anesthetic). You may also be given a medicine to make you relax (sedative).    A medicine that makes you go to sleep (general anesthetic).  · Your arm, hand, and wrist will be cleaned with a germ-killing solution (antiseptic).  · Your surgeon will make a surgical cut (incision) over the palm side of your wrist. The surgeon will pull aside the skin of your wrist to expose the carpal tunnel space.  · The surgeon will cut the transverse carpal ligament.  · The edges of the incision will be closed with stitches (sutures) or staples.  · A bandage (dressing) will be placed over your wrist and wrapped around your hand and wrist.  AFTER THE PROCEDURE  · You may spend some time in a recovery area.  · Your blood pressure, heart rate, breathing rate, and blood oxygen level will be monitored often until the medicines you were given have worn off.  · You will likely have some pain. You will be given pain medicine.  · You may need to wear a splint or a wrist brace over your dressing.     This information is not intended to replace advice given to you by your health care provider. Make sure you discuss any questions you have with your health care provider.     Document Released:   02/06/2004 Document Revised: 12/07/2014 Document Reviewed: 07/04/2014  Elsevier Interactive Patient Education ©2016 Elsevier Inc.

## 2016-01-01 ENCOUNTER — Emergency Department (INDEPENDENT_AMBULATORY_CARE_PROVIDER_SITE_OTHER)
Admission: EM | Admit: 2016-01-01 | Discharge: 2016-01-01 | Disposition: A | Discharge status: Home / Self Care | Payer: PRIVATE HEALTH INSURANCE | Source: Home / Self Care | Attending: Family Medicine | Admitting: Family Medicine

## 2016-01-01 ENCOUNTER — Encounter (HOSPITAL_COMMUNITY): Payer: Self-pay | Admitting: *Deleted

## 2016-01-01 DIAGNOSIS — L708 Other acne: Secondary | ICD-10-CM

## 2016-01-01 MED ORDER — MINOCYCLINE HCL 100 MG PO CAPS: 100.0000 mg | ORAL_CAPSULE | Freq: Two times a day (BID) | ORAL | Status: DC

## 2016-01-01 MED ORDER — CLINDAMYCIN PHOS-BENZOYL PEROX 1-5 % EX GEL: Freq: Two times a day (BID) | CUTANEOUS | Status: DC

## 2016-01-01 NOTE — ED Provider Notes (Signed)
CSN: [id number]     Arrival date & time 01/01/16  1807 History   First MD Initiated Contact with Patient 01/01/16 1956     Chief Complaint  Patient presents with  . Rash   (Consider location/radiation/quality/duration/timing/severity/associated sxs/prior Treatment) Patient is a 40 y.o. female presenting with rash. The history is provided by the patient and a relative. The history is limited by a language barrier. Language interpreter used: son translated.  Rash Location:  Face Facial rash location:  Face Quality: redness   Quality comment:  Papulopustular acneiform Severity:  Mild Onset quality:  Gradual Chronicity:  Chronic   Past Medical History  Diagnosis Date  . No pertinent past medical history   . Hypertension    Past Surgical History  Procedure Laterality Date  . No past surgeries     History reviewed. No pertinent family history. Social History  Substance Use Topics  . Smoking status: Never Smoker   . Smokeless tobacco: Never Used  . Alcohol Use: No   OB History    Gravida Para Term Preterm AB TAB SAB Ectopic Multiple Living   0 0 0 0 0 0 4     Review of Systems  Constitutional: Negative.   HENT: Negative.   Skin: Positive for rash.    Allergies  Review of patient's allergies indicates no known allergies.  Home Medications   Prior to Admission medications   Medication Sig Start Date End Date Taking? Authorizing Provider  atenolol (TENORMIN) 50 MG tablet Take 1 tablet (50 mg total) by mouth daily. 11/14/15   Jaclyn Shaggy, MD  clindamycin-benzoyl peroxide (BENZACLIN) gel Apply topically 2 (two) times daily. 01/01/16   Linna Hoff, MD  meloxicam (MOBIC) 7.5 MG tablet Take 1 tablet (7.5 mg total) by mouth daily. 11/14/15   Jaclyn Shaggy, MD  minocycline (MINOCIN,DYNACIN) 100 MG capsule Take 1 capsule (100 mg total) by mouth 2 (two) times daily. 01/01/16   Linna Hoff, MD  predniSONE (DELTASONE) 20 MG tablet Take 1 tablet (20 mg total) by mouth daily  with breakfast. 11/14/15   Jaclyn Shaggy, MD   Meds Ordered and Administered this Visit  Medications - No data to display  BP 168/111 mmHg  Pulse 90  Temp(Src) 98.1 F (36.7 C) (Oral)  Resp 18  SpO2 100%  LMP 12/18/2015 No data found.   Physical Exam  Constitutional: She is oriented to person, place, and time. She appears well-developed and well-nourished. No distress.  Neck: Normal range of motion. Neck supple.  Neurological: She is alert and oriented to person, place, and time.  Skin: Skin is warm and dry.  Papulopustular acneiform facial rash. nonvesicular.  Nursing note and vitals reviewed.   ED Course  Procedures (including critical care time)  Labs Review Labs Reviewed - No data to display  Imaging Review No results found.   Visual Acuity Review  Right Eye Distance:   Left Eye Distance:   Bilateral Distance:    Right Eye Near:   Left Eye Near:    Bilateral Near:         MDM   1. Acne cosmetica        Linna Hoff, MD 01/01/16 2056

## 2016-01-01 NOTE — ED Notes (Signed)
Pt  Has  A  Rash on her  Face  And  Headache    X  3  Days           Pt  Has  A  History  Of  htn  In  Past  Takes  meds  For  Same  Son is  In to  Interpret

## 2016-01-17 ENCOUNTER — Ambulatory Visit: Payer: Self-pay

## 2016-01-31 ENCOUNTER — Ambulatory Visit: Payer: Self-pay | Attending: Family Medicine

## 2016-02-14 ENCOUNTER — Telehealth: Payer: Self-pay | Admitting: Family Medicine

## 2016-02-14 DIAGNOSIS — I10 Essential (primary) hypertension: Secondary | ICD-10-CM

## 2016-02-14 NOTE — Telephone Encounter (Signed)
Patient called requesting medication refill. Patient was told she couldn't get medicines until she has an office visit. Patients office visit was switched from 3/31 and moved to 4/5 due to provider being out of the office.  Patient is needing her atenolol. Please follow up.    [employer]. #[id number]

## 2016-02-24 MED ORDER — ATENOLOL 50 MG PO TABS: 50.0000 mg | ORAL_TABLET | Freq: Every day | ORAL | Status: DC

## 2016-02-24 NOTE — Telephone Encounter (Signed)
Refill sent to pharmacy.   

## 2016-02-28 ENCOUNTER — Ambulatory Visit: Payer: Self-pay | Admitting: Family Medicine

## 2016-03-04 ENCOUNTER — Encounter: Payer: Self-pay | Admitting: Family Medicine

## 2016-03-04 ENCOUNTER — Ambulatory Visit: Payer: Self-pay | Attending: Family Medicine | Admitting: Family Medicine

## 2016-03-04 VITALS — BP 147/100 | HR 90 | Temp 97.8°F | Resp 15 | Ht 66.0 in | Wt 238.2 lb

## 2016-03-04 DIAGNOSIS — M25562 Pain in left knee: Secondary | ICD-10-CM

## 2016-03-04 DIAGNOSIS — I10 Essential (primary) hypertension: Secondary | ICD-10-CM | POA: Insufficient documentation

## 2016-03-04 DIAGNOSIS — M25561 Pain in right knee: Secondary | ICD-10-CM

## 2016-03-04 DIAGNOSIS — M2550 Pain in unspecified joint: Secondary | ICD-10-CM | POA: Insufficient documentation

## 2016-03-04 DIAGNOSIS — G44229 Chronic tension-type headache, not intractable: Secondary | ICD-10-CM

## 2016-03-04 DIAGNOSIS — Z79899 Other long term (current) drug therapy: Secondary | ICD-10-CM | POA: Insufficient documentation

## 2016-03-04 DIAGNOSIS — R51 Headache: Secondary | ICD-10-CM | POA: Insufficient documentation

## 2016-03-04 DIAGNOSIS — L700 Acne vulgaris: Secondary | ICD-10-CM | POA: Insufficient documentation

## 2016-03-04 DIAGNOSIS — L709 Acne, unspecified: Secondary | ICD-10-CM | POA: Insufficient documentation

## 2016-03-04 MED ORDER — CYCLOBENZAPRINE HCL 10 MG PO TABS: 10.0000 mg | ORAL_TABLET | Freq: Every day | ORAL | Status: DC

## 2016-03-04 MED ORDER — CLINDAMYCIN PHOS-BENZOYL PEROX 1-5 % EX GEL: Freq: Two times a day (BID) | CUTANEOUS | Status: DC

## 2016-03-04 MED ORDER — MINOCYCLINE HCL 100 MG PO CAPS: 100.0000 mg | ORAL_CAPSULE | Freq: Two times a day (BID) | ORAL | Status: DC

## 2016-03-04 MED ORDER — MELOXICAM 7.5 MG PO TABS: 7.5000 mg | ORAL_TABLET | Freq: Every day | ORAL | Status: DC

## 2016-03-04 MED ORDER — TOPAMAX 25 MG PO TABS: 25.0000 mg | ORAL_TABLET | Freq: Two times a day (BID) | ORAL | Status: DC

## 2016-03-04 MED ORDER — ATENOLOL 50 MG PO TABS: 50.0000 mg | ORAL_TABLET | Freq: Every day | ORAL | Status: DC

## 2016-03-04 NOTE — Progress Notes (Signed)
Subjective:  Patient ID: Carolyn Ramsey, female    DOB: 1976/07/15  Age: 40 y.o. MRN: [id number]  CC: Headache and Leg Pain   HPI Carolyn Ramsey is a 40 year old female with a history of hypertension who comes into the clinic for a follow-up visit.  She had complained of carpal tunnel syndrome at her last office visit for which she received prednisone with resulting improvement in symptoms.   Today she complains of needing refills of her acne medications which she received from the emergency room but never refilled. She has also had headaches on the right side of her head extending from frontal to occipital region and also at the base of her neck posteriorly which occur about 3 times a week and could last for 24 hours. She denies photophobia, nausea or vomiting or tearing. Has pains in both knees which radiate to the muscles of her leg and is worse at night; complains of alternating hot and cold sensations with no history of trauma.  Outpatient Prescriptions Prior to Visit  Medication Sig Dispense Refill  . atenolol (TENORMIN) 50 MG tablet Take 1 tablet (50 mg total) by mouth daily. 30 tablet 0  . clindamycin-benzoyl peroxide (BENZACLIN) gel Apply topically 2 (two) times daily. (Patient not taking: Reported on 03/04/2016) 50 g 2  . meloxicam (MOBIC) 7.5 MG tablet Take 1 tablet (7.5 mg total) by mouth daily. (Patient not taking: Reported on 03/04/2016) 30 tablet 1  . minocycline (MINOCIN,DYNACIN) 100 MG capsule Take 1 capsule (100 mg total) by mouth 2 (two) times daily. (Patient not taking: Reported on 03/04/2016) 60 capsule 0  . predniSONE (DELTASONE) 20 MG tablet Take 1 tablet (20 mg total) by mouth daily with breakfast. (Patient not taking: Reported on 03/04/2016) 5 tablet 0   No facility-administered medications prior to visit.    ROS Review of Systems  Constitutional: Negative for activity change, appetite change and fatigue.  HENT: Negative for congestion, sinus pressure and sore throat.    Eyes: Negative for visual disturbance.  Respiratory: Negative for cough, chest tightness, shortness of breath and wheezing.   Cardiovascular: Negative for chest pain and palpitations.  Gastrointestinal: Negative for abdominal pain, constipation and abdominal distention.  Endocrine: Negative for polydipsia.  Genitourinary: Negative for dysuria and frequency.  Musculoskeletal: Negative for back pain and arthralgias.  Skin: Positive for rash (acne on face).  Neurological: Negative for tremors, light-headedness and numbness.  Hematological: Does not bruise/bleed easily.  Psychiatric/Behavioral: Negative for behavioral problems and agitation.    Objective:  BP 147/100 mmHg  Pulse 90  Temp(Src) 97.8 F (36.6 C)  Resp 15  Ht  (1.676 m)  Wt 238 lb 3.2 oz (108.047 kg)  BMI 38.46 kg/m2  SpO2 99%  BP/Weight 03/04/2016 01/01/2016 11/14/2015  Systolic BP 147 168 139  Diastolic BP 100 111 82  Wt. (Lbs) 238.2 - 236  BMI 38.46 - 37.53      Physical Exam  Constitutional: She is oriented to person, place, and time. She appears well-developed and well-nourished.  Cardiovascular: Normal rate, normal heart sounds and intact distal pulses.   No murmur heard. Pulmonary/Chest: Effort normal and breath sounds normal. She has no wheezes. She has no rales. She exhibits no tenderness.  Abdominal: Soft. Bowel sounds are normal. She exhibits no distension and no mass. There is no tenderness.  Musculoskeletal: She exhibits no edema or tenderness.  See hpi  Neurological: She is alert and oriented to person, place, and time.  Skin: Skin is warm and dry.  Rash (Acne on face) noted.     Assessment & Plan:   1. Essential hypertension Controlled with blood pressure slightly above goal of less than 140/90 Low-sodium, DASH diet discussed as well as exercise regimen - atenolol (TENORMIN) 50 MG tablet; Take 1 tablet (50 mg total) by mouth daily.  Dispense: 90 tablet; Refill: 1 - Lipid panel; Future -  COMPLETE METABOLIC PANEL WITH GFR; Future  2. Chronic tension-type headache, not intractable Discussed side effects of Topamax; patient has IUD in place. No overt signs pointing towards migraines - TOPAMAX 25 MG tablet; Take 1 tablet (25 mg total) by mouth 2 (two) times daily.  Dispense: 180 tablet; Refill: 0  3. Acne vulgaris - minocycline (MINOCIN,DYNACIN) 100 MG capsule; Take 1 capsule (100 mg total) by mouth 2 (two) times daily.  Dispense: 180 capsule; Refill: 0 - clindamycin-benzoyl peroxide (BENZACLIN) gel; Apply topically 2 (two) times daily.  Dispense: 50 g; Refill: 2  4. Arthralgia of both lower legs - cyclobenzaprine (FLEXERIL) 10 MG tablet; Take 1 tablet (10 mg total) by mouth at bedtime.  Dispense: 90 tablet; Refill: 0 - meloxicam (MOBIC) 7.5 MG tablet; Take 1 tablet (7.5 mg total) by mouth daily.  Dispense: 30 tablet; Refill: 1   Meds ordered this encounter  Medications  . atenolol (TENORMIN) 50 MG tablet    Sig: Take 1 tablet (50 mg total) by mouth daily.    Dispense:  90 tablet    Refill:  1  . TOPAMAX 25 MG tablet    Sig: Take 1 tablet (25 mg total) by mouth 2 (two) times daily.    Dispense:  180 tablet    Refill:  0  . cyclobenzaprine (FLEXERIL) 10 MG tablet    Sig: Take 1 tablet (10 mg total) by mouth at bedtime.    Dispense:  90 tablet    Refill:  0  . minocycline (MINOCIN,DYNACIN) 100 MG capsule    Sig: Take 1 capsule (100 mg total) by mouth 2 (two) times daily.    Dispense:  180 capsule    Refill:  0  . DISCONTD: clindamycin-benzoyl peroxide (BENZACLIN) gel    Sig: Apply topically 2 (two) times daily.    Dispense:  50 g    Refill:  2  . meloxicam (MOBIC) 7.5 MG tablet    Sig: Take 1 tablet (7.5 mg total) by mouth daily.    Dispense:  30 tablet    Refill:  1  . clindamycin-benzoyl peroxide (BENZACLIN) gel    Sig: Apply topically 2 (two) times daily.    Dispense:  50 g    Refill:  2    Follow-up: Return in about 3 months (around 06/03/2016) for follow  up on headaches.   Jaclyn ShaggyEnobong Amao MD

## 2016-03-04 NOTE — Progress Notes (Signed)
Complains of 3 months of bilateral knee pain that shoots down into her feet Also reports severe frontal headache that keeps her awake at night She also complains of facial acne

## 2016-03-05 ENCOUNTER — Ambulatory Visit: Payer: Self-pay | Attending: Family Medicine

## 2016-03-05 DIAGNOSIS — I10 Essential (primary) hypertension: Secondary | ICD-10-CM

## 2016-03-06 ENCOUNTER — Telehealth: Payer: Self-pay

## 2016-03-06 LAB — COMPLETE METABOLIC PANEL WITH GFR
ALT: 11 U/L (ref 6–29)
AST: 15 U/L (ref 10–30)
Albumin: 4.1 g/dL (ref 3.6–5.1)
Alkaline Phosphatase: 54 U/L (ref 33–115)
BILIRUBIN TOTAL: 0.4 mg/dL (ref 0.2–1.2)
BUN: 10 mg/dL (ref 7–25)
CO2: 22 mmol/L (ref 20–31)
CREATININE: 0.4 mg/dL — AB (ref 0.50–1.10)
Calcium: 8.4 mg/dL — ABNORMAL LOW (ref 8.6–10.2)
Chloride: 104 mmol/L (ref 98–110)
GFR, Est African American: >89 mL/min (ref >=60)
GLUCOSE: 67 mg/dL (ref 65–99)
Potassium: 4.2 mmol/L (ref 3.5–5.3)
SODIUM: 139 mmol/L (ref 135–146)
TOTAL PROTEIN: 7.1 g/dL (ref 6.1–8.1)

## 2016-03-06 LAB — LIPID PANEL
Cholesterol: 159 mg/dL (ref 125–200)
HDL: 51 mg/dL (ref >=46)
LDL CALC: 99 mg/dL (ref <130)
Total CHOL/HDL Ratio: 3.1 Ratio (ref <=5.0)
Triglycerides: 47 mg/dL (ref <150)
VLDL: 9 mg/dL (ref <30)

## 2016-03-06 NOTE — Telephone Encounter (Signed)
-----   Message from Jaclyn ShaggyEnobong Amao, MD sent at 03/06/2016 11:02 AM EDT ----- Please inform the patient that labs are normal. Thank you.

## 2016-03-06 NOTE — Telephone Encounter (Signed)
Call placed to patient using [employer] (Interpreter [phone]) to inform of lab results; however, unable to reach patient and voicemail not set up to leave message. Will attempt to reach patient at a later time.

## 2016-03-09 ENCOUNTER — Telehealth: Payer: Self-pay

## 2016-03-09 NOTE — Telephone Encounter (Signed)
Call placed to the patient with the assistance of Arabic Interpreter # [phone] with [employer]. Informed the patient that her lab work was normal.  The patient was appreciative of the call.

## 2016-05-02 ENCOUNTER — Ambulatory Visit (HOSPITAL_COMMUNITY)
Admission: EM | Admit: 2016-05-02 | Discharge: 2016-05-02 | Disposition: A | Discharge status: Home / Self Care | Payer: Medicaid Other | Attending: Family Medicine | Admitting: Family Medicine

## 2016-05-02 ENCOUNTER — Encounter (HOSPITAL_COMMUNITY): Payer: Self-pay | Admitting: Emergency Medicine

## 2016-05-02 DIAGNOSIS — Z3201 Encounter for pregnancy test, result positive: Secondary | ICD-10-CM

## 2016-05-02 LAB — POCT URINALYSIS DIP (DEVICE)
BILIRUBIN URINE: NEGATIVE
GLUCOSE, UA: NEGATIVE mg/dL
Hgb urine dipstick: NEGATIVE
KETONES UR: NEGATIVE mg/dL
LEUKOCYTES UA: NEGATIVE
Nitrite: NEGATIVE
Protein, ur: 30 mg/dL — AB
UROBILINOGEN UA: 0.2 mg/dL (ref 0.0–1.0)
pH: 6 (ref 5.0–8.0)

## 2016-05-02 LAB — POCT PREGNANCY, URINE: Preg Test, Ur: POSITIVE — AB

## 2016-05-02 NOTE — ED Notes (Signed)
Pt unable to provide urine sample at the moment.... Offered water but pt is fasting due to Ramadan

## 2016-05-02 NOTE — ED Notes (Signed)
Via phone Arabic interpreter, ID [phone] Reports menstrual periods has not come on x2 months... Reports she's had IUD x5 years Sx today also include HA A&O x4... No acute distress.

## 2016-05-02 NOTE — ED Provider Notes (Signed)
CSN: [id number]     Arrival date & time 05/02/16  1423 History   First MD Initiated Contact with Patient 05/02/16 1439     Chief Complaint  Patient presents with  . Possible Pregnancy   (Consider location/radiation/quality/duration/timing/severity/associated sxs/prior Treatment) HPI Comments: 40 year old obese female is accompanied by her female significant other stating that she has not had a stroke. In 2 months. She states that she has had an IUD for 5 years. She request to have evaluation of the IUD. She also is concerned that she may be pregnant and that she feels "down in the dumps" An interpreter was used to obtain information. Even so there are some difficulty in understanding the cultural meaning of her complaints. She states she is unable to describe how she feels except that she feels down in the dumps. She is denying pain anywhere. Denies unusual bleeding.  When asked about depression and responding that is difficult to assess depression and provide management in this setting response was that she was not depressed but just feeling "down in the dumps".  The patient is unable to provide a urine specimen at this time she is currently waiting in the lobby and will let someone know when she is able.   Past Medical History  Diagnosis Date  . No pertinent past medical history   . Hypertension    Past Surgical History  Procedure Laterality Date  . No past surgeries     No family history on file. Social History  Substance Use Topics  . Smoking status: Never Smoker   . Smokeless tobacco: Never Used  . Alcohol Use: No   OB History    Gravida Para Term Preterm AB TAB SAB Ectopic Multiple Living   0 0 0 0 0 0 4     Review of Systems  Constitutional: Negative for fever, activity change and fatigue.  HENT: Negative.   Respiratory: Negative.   Genitourinary: Positive for menstrual problem. Negative for dysuria, vaginal bleeding and vaginal discharge.  Musculoskeletal:  Negative.     Allergies  Review of patient's allergies indicates no known allergies.  Home Medications   Prior to Admission medications   Medication Sig Start Date End Date Taking? Authorizing Provider  atenolol (TENORMIN) 50 MG tablet Take 1 tablet (50 mg total) by mouth daily. 03/04/16  Yes Jaclyn Shaggy, MD  clindamycin-benzoyl peroxide (BENZACLIN) gel Apply topically 2 (two) times daily. 03/04/16   Jaclyn Shaggy, MD  cyclobenzaprine (FLEXERIL) 10 MG tablet Take 1 tablet (10 mg total) by mouth at bedtime. 03/04/16   Jaclyn Shaggy, MD  meloxicam (MOBIC) 7.5 MG tablet Take 1 tablet (7.5 mg total) by mouth daily. 03/04/16   Jaclyn Shaggy, MD  minocycline (MINOCIN,DYNACIN) 100 MG capsule Take 1 capsule (100 mg total) by mouth 2 (two) times daily. 03/04/16   Jaclyn Shaggy, MD  TOPAMAX 25 MG tablet Take 1 tablet (25 mg total) by mouth 2 (two) times daily. 03/04/16   Jaclyn Shaggy, MD   Meds Ordered and Administered this Visit  Medications - No data to display  BP 124/84 mmHg  Pulse 79  Temp(Src) 97.5 F (36.4 C) (Oral)  SpO2 99% No data found.   Physical Exam  Constitutional: She appears well-developed and well-nourished. No distress.  Eyes: EOM are normal.  Neck: Normal range of motion. Neck supple.  Cardiovascular: Normal rate.   Pulmonary/Chest: Effort normal.  Neurological: She is alert.  Skin: Skin is warm and dry.  Nursing note and vitals reviewed.  ED Course  Procedures (including critical care time)  Labs Review Labs Reviewed  POCT URINALYSIS DIP (DEVICE) - Abnormal; Notable for the following:    Protein, ur 30 (*)    All other components within normal limits  POCT PREGNANCY, URINE - Abnormal; Notable for the following:    Preg Test, Ur POSITIVE (*)    All other components within normal limits    Imaging Review No results found.   Visual Acuity Review  Right Eye Distance:   Left Eye Distance:   Bilateral Distance:    Right Eye Near:   Left Eye Near:    Bilateral  Near:         MDM   1. Pregnancy test positive    Call the phone number on p.1 for appointment. [facility]     Ehtan Delfavero, NP 05/02/16 1612  Hayden Rasmussenavid Martez Weiand, NP 05/02/16 2026

## 2016-05-02 NOTE — Discharge Instructions (Signed)
Prenatal Care Call the phone number on p.1 for appointment. [facility] problems [facility] the [facility] WHAT IS PRENATAL CARE?  Prenatal care is the process of caring for a pregnant woman before she gives birth. Prenatal care makes sure that she and her baby remain as healthy as possible throughout pregnancy. Prenatal care may be provided by a midwife, family practice health care provider, or a childbirth and pregnancy specialist (obstetrician). Prenatal care may include physical examinations, testing, treatments, and education on nutrition, lifestyle, and social support services. WHY IS PRENATAL CARE SO IMPORTANT?  Early and consistent prenatal care increases the chance that you and your baby will remain healthy throughout your pregnancy. This type of care also decreases a baby's risk of being born too early (prematurely), or being born smaller than expected (small for gestational age). Any underlying medical conditions you may have that could pose a risk during your pregnancy are discussed during prenatal care visits. You will also be monitored regularly for any new conditions that may arise during your pregnancy so they can be treated quickly and effectively. WHAT HAPPENS DURING PRENATAL CARE VISITS? Prenatal care visits may include the following: Discussion Tell your health care provider about any new signs or symptoms you have experienced since your last visit. These might include:  Nausea or vomiting.  Increased or decreased level of energy.  Difficulty sleeping.  Back or leg pain.  Weight changes.  Frequent urination.  Shortness of breath with physical activity.  Changes in your skin, such as the development of a rash or itchiness.  Vaginal discharge or bleeding.  Feelings of excitement or nervousness.  Changes in your baby's movements. You may want to write down any questions or topics you want to discuss with your health care provider and bring them with you to your  appointment. Examination During your first prenatal care visit, you will likely have a complete physical exam. Your health care provider will often examine your vagina, cervix, and the position of your uterus, as well as check your heart, lungs, and other body systems. As your pregnancy progresses, your health care provider will measure the size of your uterus and your baby's position inside your uterus. He or she may also examine you for early signs of labor. Your prenatal visits may also include checking your blood pressure and, after about 10-12 weeks of pregnancy, listening to your baby's heartbeat. Testing Regular testing often includes:  Urinalysis. This checks your urine for glucose, protein, or signs of infection.  Blood count. This checks the levels of white and red blood cells in your body.  Tests for sexually transmitted infections (STIs). Testing for STIs at the beginning of pregnancy is routinely done and is required in many states.  Antibody testing. You will be checked to see if you are immune to certain illnesses, such as rubella, that can affect a developing fetus.  Glucose screen. Around 24-28 weeks of pregnancy, your blood glucose level will be checked for signs of gestational diabetes. Follow-up tests may be recommended.  Group B strep. This is a bacteria that is commonly found inside a woman's vagina. This test will inform your health care provider if you need an antibiotic to reduce the amount of this bacteria in your body prior to labor and childbirth.  Ultrasound. Many pregnant women undergo an ultrasound screening around 18-20 weeks of pregnancy to evaluate the health of the fetus and check for any developmental abnormalities.  HIV (human immunodeficiency virus) testing. Early in your pregnancy,  you will be screened for HIV. If you are at high risk for HIV, this test may be repeated during your third trimester of pregnancy. You may be offered other testing based on your  age, personal or family medical history, or other factors.  HOW OFTEN SHOULD I PLAN TO SEE MY HEALTH CARE PROVIDER FOR PRENATAL CARE? Your prenatal care check-up schedule depends on any medical conditions you have before, or develop during, your pregnancy. If you do not have any underlying medical conditions, you will likely be seen for checkups:  Monthly, during the first 6 months of pregnancy.  Twice a month during months 7 and 8 of pregnancy.  Weekly starting in the 9th month of pregnancy and until delivery. If you develop signs of early labor or other concerning signs or symptoms, you may need to see your health care provider more often. Ask your health care provider what prenatal care schedule is best for you. WHAT CAN I DO TO KEEP MYSELF AND MY BABY AS HEALTHY AS POSSIBLE DURING MY PREGNANCY?  Take a prenatal vitamin containing 400 micrograms (0.4 mg) of folic acid every day. Your health care provider may also ask you to take additional vitamins such as iodine, vitamin D, iron, copper, and zinc.  Take 1500-2000 mg of calcium daily starting at your 20th week of pregnancy until you deliver your baby.  Make sure you are up to date on your vaccinations. Unless directed otherwise by your health care provider:  You should receive a tetanus, diphtheria, and pertussis (Tdap) vaccination between the 27th and 36th week of your pregnancy, regardless of when your last Tdap immunization occurred. This helps protect your baby from whooping cough (pertussis) after he or she is born.  You should receive an annual inactivated influenza vaccine (IIV) to help protect you and your baby from influenza. This can be done at any point during your pregnancy.  Eat a well-rounded diet that includes:  Fresh fruits and vegetables.  Lean proteins.  Calcium-rich foods such as milk, yogurt, hard cheeses, and dark, leafy greens.  Whole grain breads.  Do noteat seafood high in mercury,  including:  Swordfish.  Tilefish.  Shark.  King mackerel.  More than 6 oz tuna per week.  Do not eat:  Raw or undercooked meats or eggs.  Unpasteurized foods, such as soft cheeses (brie, blue, or feta), juices, and milks.  Lunch meats.  Hot dogs that have not been heated until they are steaming.  Drink enough water to keep your urine clear or pale yellow. For many women, this may be 10 or more 8 oz glasses of water each day. Keeping yourself hydrated helps deliver nutrients to your baby and may prevent the start of pre-term uterine contractions.  Do not use any tobacco products including cigarettes, chewing tobacco, or electronic cigarettes. If you need help quitting, ask your health care provider.  Do not drink beverages containing alcohol. No safe level of alcohol consumption during pregnancy has been determined.  Do not use any illegal drugs. These can harm your developing baby or cause a miscarriage.  Ask your health care provider or pharmacist before taking any prescription or over-the-counter medicines, herbs, or supplements.  Limit your caffeine intake to no more than 200 mg per day.  Exercise. Unless told otherwise by your health care provider, try to get 30 minutes of moderate exercise most days of the week. Do not  do high-impact activities, contact sports, or activities with a high risk of falling, such as  horseback riding or downhill skiing.  Get plenty of rest.  Avoid anything that raises your body temperature, such as hot tubs and saunas.  If you own a cat, do not empty its litter box. Bacteria contained in cat feces can cause an infection called toxoplasmosis. This can result in serious harm to the fetus.  Stay away from chemicals such as insecticides, lead, mercury, and cleaning or paint products that contain solvents.  Do not have any X-rays taken unless medically necessary.  Take a childbirth and breastfeeding preparation class. Ask your health care  provider if you need a referral or recommendation.   This information is not intended to replace advice given to you by your health care provider. Make sure you discuss any questions you have with your health care provider.   Document Released: 11/19/2003 Document Revised: 12/07/2014 Document Reviewed: 01/31/2014 Elsevier Interactive Patient Education [employer]

## 2016-05-02 NOTE — ED Notes (Signed)
Pt still unable to provide urine sample 

## 2016-05-14 ENCOUNTER — Other Ambulatory Visit: Payer: Self-pay | Admitting: General Practice

## 2016-05-14 ENCOUNTER — Ambulatory Visit (HOSPITAL_COMMUNITY)
Admission: RE | Admit: 2016-05-14 | Discharge: 2016-05-14 | Disposition: A | Discharge status: Home / Self Care | Payer: Medicaid Other | Source: Ambulatory Visit | Attending: Obstetrics & Gynecology | Admitting: Obstetrics & Gynecology

## 2016-05-14 DIAGNOSIS — O9989 Other specified diseases and conditions complicating pregnancy, childbirth and the puerperium: Principal | ICD-10-CM

## 2016-05-14 DIAGNOSIS — Z36 Encounter for antenatal screening of mother: Secondary | ICD-10-CM | POA: Diagnosis present

## 2016-05-14 DIAGNOSIS — T8331XA Breakdown (mechanical) of intrauterine contraceptive device, initial encounter: Secondary | ICD-10-CM

## 2016-05-14 DIAGNOSIS — Z3A10 10 weeks gestation of pregnancy: Secondary | ICD-10-CM | POA: Insufficient documentation

## 2016-05-14 DIAGNOSIS — Z331 Pregnant state, incidental: Secondary | ICD-10-CM

## 2016-05-15 ENCOUNTER — Encounter: Payer: Self-pay | Admitting: Obstetrics & Gynecology

## 2016-05-15 ENCOUNTER — Ambulatory Visit (INDEPENDENT_AMBULATORY_CARE_PROVIDER_SITE_OTHER): Payer: Medicaid Other | Admitting: Obstetrics & Gynecology

## 2016-05-15 VITALS — BP 118/76 | HR 78 | Wt 225.6 lb

## 2016-05-15 DIAGNOSIS — Z30432 Encounter for removal of intrauterine contraceptive device: Secondary | ICD-10-CM | POA: Diagnosis not present

## 2016-05-15 DIAGNOSIS — O2631 Retained intrauterine contraceptive device in pregnancy, first trimester: Secondary | ICD-10-CM | POA: Diagnosis not present

## 2016-05-15 DIAGNOSIS — O3680X0 Pregnancy with inconclusive fetal viability, not applicable or unspecified: Secondary | ICD-10-CM

## 2016-05-15 NOTE — Progress Notes (Signed)
   Subjective:    Patient ID: Carolyn Ramsey, female    DOB: 03/16/1976, 40 y.o.   MRN: [id number]  HPI 40 yo obese M Arabic-speaking P4 is here to have her IUD removed (already 10 weeks pregnancy). She is aware that there is a 25% risk of miscarriage with removal, 50% risk with leaving it in.   Review of Systems     Objective:   Physical Exam  Obese AA woman Breathing and conversing (with interpretor) normally The u/s shows no intrauterine IUD It was in the cervix and easily removed No bleeding noted      Assessment & Plan:  10 week pregnancy[facility]

## 2016-05-29 ENCOUNTER — Ambulatory Visit: Payer: Medicaid Other | Attending: Family Medicine | Admitting: Family Medicine

## 2016-05-29 ENCOUNTER — Encounter: Payer: Self-pay | Admitting: Family Medicine

## 2016-05-29 VITALS — BP 126/75 | HR 90 | Temp 98.0°F | Resp 20 | Ht 66.0 in | Wt 230.6 lb

## 2016-05-29 DIAGNOSIS — Z3490 Encounter for supervision of normal pregnancy, unspecified, unspecified trimester: Secondary | ICD-10-CM | POA: Insufficient documentation

## 2016-05-29 DIAGNOSIS — M25561 Pain in right knee: Secondary | ICD-10-CM

## 2016-05-29 DIAGNOSIS — Z331 Pregnant state, incidental: Secondary | ICD-10-CM | POA: Diagnosis not present

## 2016-05-29 DIAGNOSIS — I10 Essential (primary) hypertension: Secondary | ICD-10-CM

## 2016-05-29 MED ORDER — PRENATAL 27-0.8 MG PO TABS: 1.0000 | ORAL_TABLET | Freq: Every day | ORAL | Status: DC

## 2016-05-29 MED ORDER — LABETALOL HCL 100 MG PO TABS: 100.0000 mg | ORAL_TABLET | Freq: Two times a day (BID) | ORAL | Status: DC

## 2016-05-29 NOTE — Progress Notes (Signed)
Pregnant-3 months wants to know how meds will effect pregnancy Right knee pain- fell two days ago down steps (5) Had iud removed when she found out she was pregnant

## 2016-05-29 NOTE — Patient Instructions (Signed)
First Trimester of Pregnancy The first trimester of pregnancy is from week 1 until the end of week 12 (months 1 through 3). A week after a sperm fertilizes an egg, the egg will implant on the wall of the uterus. This embryo will begin to develop into a baby. Genes from you and your partner are forming the baby. The female genes determine whether the baby is a boy or a girl. At 6-8 weeks, the eyes and face are formed, and the heartbeat can be seen on ultrasound. At the end of 12 weeks, all the baby's organs are formed.  Now that you are pregnant, you will want to do everything you can to have a healthy baby. Two of the most important things are to get good prenatal care and to follow your health care provider's instructions. Prenatal care is all the medical care you receive before the baby's birth. This care will help prevent, find, and treat any problems during the pregnancy and childbirth. BODY CHANGES Your body goes through many changes during pregnancy. The changes vary from woman to woman.   You may gain or lose a couple of pounds at first.  You may feel sick to your stomach (nauseous) and throw up (vomit). If the vomiting is uncontrollable, call your health care provider.  You may tire easily.  You may develop headaches that can be relieved by medicines approved by your health care provider.  You may urinate more often. Painful urination may mean you have a bladder infection.  You may develop heartburn as a result of your pregnancy.  You may develop constipation because certain hormones are causing the muscles that push waste through your intestines to slow down.  You may develop hemorrhoids or swollen, bulging veins (varicose veins).  Your breasts may begin to grow larger and become tender. Your nipples may stick out more, and the tissue that surrounds them (areola) may become darker.  Your gums may bleed and may be sensitive to brushing and flossing.  Dark spots or blotches (chloasma,  mask of pregnancy) may develop on your face. This will likely fade after the baby is born.  Your menstrual periods will stop.  You may have a loss of appetite.  You may develop cravings for certain kinds of food.  You may have changes in your emotions from day to day, such as being excited to be pregnant or being concerned that something may go wrong with the pregnancy and baby.  You may have more vivid and strange dreams.  You may have changes in your hair. These can include thickening of your hair, rapid growth, and changes in texture. Some women also have hair loss during or after pregnancy, or hair that feels dry or thin. Your hair will most likely return to normal after your baby is born. WHAT TO EXPECT AT YOUR PRENATAL VISITS During a routine prenatal visit:  You will be weighed to make sure you and the baby are growing normally.  Your blood pressure will be taken.  Your abdomen will be measured to track your baby's growth.  The fetal heartbeat will be listened to starting around week 10 or 12 of your pregnancy.  Test results from any previous visits will be discussed. Your health care provider may ask you:  How you are feeling.  If you are feeling the baby move.  If you have had any abnormal symptoms, such as leaking fluid, bleeding, severe headaches, or abdominal cramping.  If you are using any tobacco products,   including cigarettes, chewing tobacco, and electronic cigarettes.  If you have any questions. Other tests that may be performed during your first trimester include:  Blood tests to find your blood type and to check for the presence of any previous infections. They will also be used to check for low iron levels (anemia) and Rh antibodies. Later in the pregnancy, blood tests for diabetes will be done along with other tests if problems develop.  Urine tests to check for infections, diabetes, or protein in the urine.  An ultrasound to confirm the proper growth  and development of the baby.  An amniocentesis to check for possible genetic problems.  Fetal screens for spina bifida and Down syndrome.  You may need other tests to make sure you and the baby are doing well.  HIV (human immunodeficiency virus) testing. Routine prenatal testing includes screening for HIV, unless you choose not to have this test. HOME CARE INSTRUCTIONS  Medicines  Follow your health care provider's instructions regarding medicine use. Specific medicines may be either safe or unsafe to take during pregnancy.  Take your prenatal vitamins as directed.  If you develop constipation, try taking a stool softener if your health care provider approves. Diet  Eat regular, well-balanced meals. Choose a variety of foods, such as meat or vegetable-based protein, fish, milk and low-fat dairy products, vegetables, fruits, and whole grain breads and cereals. Your health care provider will help you determine the amount of weight gain that is right for you.  Avoid raw meat and uncooked cheese. These carry germs that can cause birth defects in the baby.  Eating four or five small meals rather than three large meals a day may help relieve nausea and vomiting. If you start to feel nauseous, eating a few soda crackers can be helpful. Drinking liquids between meals instead of during meals also seems to help nausea and vomiting.  If you develop constipation, eat more high-fiber foods, such as fresh vegetables or fruit and whole grains. Drink enough fluids to keep your urine clear or pale yellow. Activity and Exercise  Exercise only as directed by your health care provider. Exercising will help you:  Control your weight.  Stay in shape.  Be prepared for labor and delivery.  Experiencing pain or cramping in the lower abdomen or low back is a good sign that you should stop exercising. Check with your health care provider before continuing normal exercises.  Try to avoid standing for long  periods of time. Move your legs often if you must stand in one place for a long time.  Avoid heavy lifting.  Wear low-heeled shoes, and practice good posture.  You may continue to have sex unless your health care provider directs you otherwise. Relief of Pain or Discomfort  Wear a good support bra for breast tenderness.   Take warm sitz baths to soothe any pain or discomfort caused by hemorrhoids. Use hemorrhoid cream if your health care provider approves.   Rest with your legs elevated if you have leg cramps or low back pain.  If you develop varicose veins in your legs, wear support hose. Elevate your feet for 15 minutes, 3-4 times a day. Limit salt in your diet. Prenatal Care  Schedule your prenatal visits by the twelfth week of pregnancy. They are usually scheduled monthly at first, then more often in the last 2 months before delivery.  Write down your questions. Take them to your prenatal visits.  Keep all your prenatal visits as directed by your   health care provider. Safety  Wear your seat belt at all times when driving.  Make a list of emergency phone numbers, including numbers for family, friends, the hospital, and police and fire departments. General Tips  Ask your health care provider for a referral to a local prenatal education class. Begin classes no later than at the beginning of month 6 of your pregnancy.  Ask for help if you have counseling or nutritional needs during pregnancy. Your health care provider can offer advice or refer you to specialists for help with various needs.  Do not use hot tubs, steam rooms, or saunas.  Do not douche or use tampons or scented sanitary pads.  Do not cross your legs for long periods of time.  Avoid cat litter boxes and soil used by cats. These carry germs that can cause birth defects in the baby and possibly loss of the fetus by miscarriage or stillbirth.  Avoid all smoking, herbs, alcohol, and medicines not prescribed by  your health care provider. Chemicals in these affect the formation and growth of the baby.  Do not use any tobacco products, including cigarettes, chewing tobacco, and electronic cigarettes. If you need help quitting, ask your health care provider. You may receive counseling support and other resources to help you quit.  Schedule a dentist appointment. At home, brush your teeth with a soft toothbrush and be gentle when you floss. SEEK MEDICAL CARE IF:   You have dizziness.  You have mild pelvic cramps, pelvic pressure, or nagging pain in the abdominal area.  You have persistent nausea, vomiting, or diarrhea.  You have a bad smelling vaginal discharge.  You have pain with urination.  You notice increased swelling in your face, hands, legs, or ankles. SEEK IMMEDIATE MEDICAL CARE IF:   You have a fever.  You are leaking fluid from your vagina.  You have spotting or bleeding from your vagina.  You have severe abdominal cramping or pain.  You have rapid weight gain or loss.  You vomit blood or material that looks like coffee grounds.  You are exposed to German measles and have never had them.  You are exposed to fifth disease or chickenpox.  You develop a severe headache.  You have shortness of breath.  You have any kind of trauma, such as from a fall or a car accident.   This information is not intended to replace advice given to you by your health care provider. Make sure you discuss any questions you have with your health care provider.   Document Released: 11/10/2001 Document Revised: 12/07/2014 Document Reviewed: 09/26/2013 Elsevier Interactive Patient Education 2016 [employer]  

## 2016-05-29 NOTE — Progress Notes (Signed)
Subjective:  Patient ID: Carolyn GiovanniIbttsam Boyson, female    DOB: 12/02/1975  Age: 40 y.o. MRN: [id number]  CC: Hypertension   HPI Carolyn Ramsey is a 40 year old female with a history of hypertension, acne, migraines who presents today for follow-up visit. She had an ED visit which confirmed pregnancy in the presence of an IUD; IUD was removed on 05/15/16 and she was scheduled for [facility] which comes up on 06/12/16. LMP was sometime in the middle of April, 2017.  Med list reveals she is on atenolol, Topamax, Minocycline, meloxicam however she informs me she only takes atenolol and never picked up the other 3 medications due to cost.  She took a fall recently sustaining injury to the right knee with associated pain, tenderness and edema. Denies fever.  Outpatient Prescriptions Prior to Visit  Medication Sig Dispense Refill  . atenolol (TENORMIN) 50 MG tablet Take 1 tablet (50 mg total) by mouth daily. 90 tablet 1  . clindamycin-benzoyl peroxide (BENZACLIN) gel Apply topically 2 (two) times daily. (Patient not taking: Reported on 05/15/2016) 50 g 2  . cyclobenzaprine (FLEXERIL) 10 MG tablet Take 1 tablet (10 mg total) by mouth at bedtime. (Patient not taking: Reported on 05/29/2016) 90 tablet 0  . meloxicam (MOBIC) 7.5 MG tablet Take 1 tablet (7.5 mg total) by mouth daily. (Patient not taking: Reported on 05/29/2016) 30 tablet 1  . minocycline (MINOCIN,DYNACIN) 100 MG capsule Take 1 capsule (100 mg total) by mouth 2 (two) times daily. (Patient not taking: Reported on 05/29/2016) 180 capsule 0  . TOPAMAX 25 MG tablet Take 1 tablet (25 mg total) by mouth 2 (two) times daily. (Patient not taking: Reported on 05/29/2016) 180 tablet 0   No facility-administered medications prior to visit.    ROS Review of Systems  Constitutional: Negative for activity change and appetite change.  HENT: Negative for sinus pressure and sore throat.   Respiratory: Negative for chest tightness,  shortness of breath and wheezing.   Cardiovascular: Negative for chest pain and palpitations.  Gastrointestinal: Negative for abdominal pain, constipation and abdominal distention.  Genitourinary: Negative.   Musculoskeletal: Positive for joint swelling.  Psychiatric/Behavioral: Negative for behavioral problems and dysphoric mood.    Objective:  BP 126/75 mmHg  Pulse 90  Temp(Src) 98 F (36.7 C) (Oral)  Resp 20  Ht 5\' 6"  (1.676 m)  Wt 230 lb 9.6 oz (104.599 kg)  BMI 37.24 kg/m2  SpO2 100%  BP/Weight 05/29/2016 05/15/2016 05/02/2016  Systolic BP 126 118 124  Diastolic BP 75 76 84  Wt. (Lbs) 230.6 225.6 -  BMI 37.24 36.43 -       Physical Exam  Constitutional: She is oriented to person, place, and time. She appears well-developed and well-nourished.  Cardiovascular: Normal rate, normal heart sounds and intact distal pulses.   No murmur heard. Pulmonary/Chest: Effort normal and breath sounds normal. She has no wheezes. She has no rales. She exhibits no tenderness.  Abdominal: Soft. Bowel sounds are normal. She exhibits no distension and no mass. There is no tenderness.  Musculoskeletal: She exhibits edema (Slight edema of right knee) and tenderness (Tenderness on palpation of medial aspect of right knee and on range of motion).  Neurological: She is alert and oriented to person, place, and time.     Assessment & Plan:   1. Essential hypertension Switch from atenolol to labetalol due to pregnancy - labetalol (NORMODYNE) 100 MG tablet; Take 1 tablet (100 mg total) by mouth 2 (two) times  daily.  Dispense: 60 tablet; Refill: 2  2. Pregnancy To keep upcoming appointment at [facility] - Prenatal Vit-Fe Fumarate-FA (MULTIVITAMIN-PRENATAL) 27-0.8 MG TABS tablet; Take 1 tablet by mouth daily at 12 noon.  Dispense: 30 each; Refill: 0  3. Right knee pain Advised to take Tylenol as needed no NSAIDs. Apply ice to right knee and elevate   Meds ordered this encounter    Medications  . labetalol (NORMODYNE) 100 MG tablet    Sig: Take 1 tablet (100 mg total) by mouth 2 (two) times daily.    Dispense:  60 tablet    Refill:  2    Discontinue atenolol  . Prenatal Vit-Fe Fumarate-FA (MULTIVITAMIN-PRENATAL) 27-0.8 MG TABS tablet    Sig: Take 1 tablet by mouth daily at 12 noon.    Dispense:  30 each    Refill:  0    Follow-up: Return in about 2 weeks (around 06/12/2016) [facility]   Carolyn ShaggyEnobong Amao MD

## 2016-06-01 ENCOUNTER — Telehealth: Payer: Self-pay | Admitting: Family Medicine

## 2016-06-01 MED ORDER — PRENATAL VITAMINS PLUS 27-1 MG PO TABS: 1.0000 | ORAL_TABLET | Freq: Every day | ORAL | Status: DC

## 2016-06-01 NOTE — Telephone Encounter (Signed)
[facility] Called the after hours line to ask if Rx for prenatal vitamin could be changed to 1 mg of folic acid from the 800 mcg. Gave VO to change Rx.  Reflected change on patient's med list.

## 2016-06-12 ENCOUNTER — Other Ambulatory Visit (HOSPITAL_COMMUNITY): Admission: RE | Admit: 2016-06-12 | Payer: Medicaid Other | Source: Ambulatory Visit | Admitting: Family Medicine

## 2016-06-12 ENCOUNTER — Encounter: Payer: Self-pay | Admitting: Family Medicine

## 2016-06-12 ENCOUNTER — Other Ambulatory Visit (HOSPITAL_COMMUNITY)
Admission: RE | Admit: 2016-06-12 | Discharge: 2016-06-12 | Disposition: A | Discharge status: Home / Self Care | Payer: Medicaid Other | Source: Ambulatory Visit | Attending: Family Medicine | Admitting: Family Medicine

## 2016-06-12 ENCOUNTER — Ambulatory Visit (INDEPENDENT_AMBULATORY_CARE_PROVIDER_SITE_OTHER): Payer: Medicaid Other | Admitting: Family Medicine

## 2016-06-12 VITALS — BP 125/82 | HR 95 | Wt 228.2 lb

## 2016-06-12 DIAGNOSIS — O10912 Unspecified pre-existing hypertension complicating pregnancy, second trimester: Secondary | ICD-10-CM

## 2016-06-12 DIAGNOSIS — Z113 Encounter for screening for infections with a predominantly sexual mode of transmission: Secondary | ICD-10-CM | POA: Insufficient documentation

## 2016-06-12 DIAGNOSIS — O0992 Supervision of high risk pregnancy, unspecified, second trimester: Secondary | ICD-10-CM

## 2016-06-12 DIAGNOSIS — Z01411 Encounter for gynecological examination (general) (routine) with abnormal findings: Secondary | ICD-10-CM | POA: Insufficient documentation

## 2016-06-12 DIAGNOSIS — N90810 Female genital mutilation status, unspecified: Secondary | ICD-10-CM

## 2016-06-12 DIAGNOSIS — Z124 Encounter for screening for malignant neoplasm of cervix: Secondary | ICD-10-CM | POA: Diagnosis not present

## 2016-06-12 DIAGNOSIS — Z1151 Encounter for screening for human papillomavirus (HPV): Secondary | ICD-10-CM | POA: Insufficient documentation

## 2016-06-12 DIAGNOSIS — L700 Acne vulgaris: Secondary | ICD-10-CM | POA: Diagnosis not present

## 2016-06-12 DIAGNOSIS — O0990 Supervision of high risk pregnancy, unspecified, unspecified trimester: Secondary | ICD-10-CM | POA: Insufficient documentation

## 2016-06-12 LAB — POCT URINALYSIS DIP (DEVICE)
Bilirubin Urine: NEGATIVE
Glucose, UA: NEGATIVE mg/dL
HGB URINE DIPSTICK: NEGATIVE
Ketones, ur: NEGATIVE mg/dL
Nitrite: NEGATIVE
PH: 5.5 (ref 5.0–8.0)
Protein, ur: NEGATIVE mg/dL
SPECIFIC GRAVITY, URINE: 1.025 (ref 1.005–1.030)
UROBILINOGEN UA: 0.2 mg/dL (ref 0.0–1.0)

## 2016-06-12 MED ORDER — CLINDAMYCIN PHOS-BENZOYL PEROX 1-5 % EX GEL: Freq: Two times a day (BID) | CUTANEOUS | Status: DC

## 2016-06-12 MED ORDER — ASPIRIN EC 81 MG PO TBEC: 81.0000 mg | DELAYED_RELEASE_TABLET | Freq: Every day | ORAL | Status: DC

## 2016-06-12 NOTE — Progress Notes (Signed)
   Subjective:    Carolyn Ramsey is a [id number] 7262w3d being seen today for her first obstetrical visit.  Her obstetrical history is significant for advanced maternal age and chronic hypertension. Patient does intend to breast feed. Pregnancy history fully reviewed.  Patient reports rash on face.  Filed Vitals:   06/12/16 0949  BP: 125/82  Pulse: 95  Weight: 228 lb 3.2 oz (103.511 kg)    HISTORY: OB History  Gravida Para Term Preterm AB SAB TAB Ectopic Multiple Living  5 4 4  0 0 0 0 0 0 4    # Outcome Date GA Lbr Len/2nd Weight Sex Delivery Anes PTL Lv  5 Current           4 Term 09/04/12 7563w4d 14:01 / 00:22 8 lb 4.3 oz (3.751 kg) M Vag-Spont None,Local  Y     Comments: wnl  3 Term 04/27/10   7 lb 0.9 oz (3.2 kg)  Vag-Spont     2 Term 08/24/04   7 lb 0.9 oz (3.2 kg)  Vag-Spont     1 Term 09/29/02   9 lb 4.2 oz (4.2 kg)  Vag-Spont        Past Medical History  Diagnosis Date  . No pertinent past medical history   . Hypertension    Past Surgical History  Procedure Laterality Date  . No past surgeries     History reviewed. No pertinent family history.   Exam    Uterus:     Pelvic Exam:    Perineum: No Hemorrhoids, Hemorrhoids, Normal Perineum   Vulva: Female circumcision.  normal, Bartholin's, Urethra, Skene's normal   Vagina:  normal mucosa   Cervix: multiparous appearance   Adnexa: no mass, fullness, tenderness   Bony Pelvis: gynecoid  System:     Skin: normal coloration and turgor, no rashes.  Acne on face and upper chest    Neurologic: gait normal; reflexes normal and symmetric   Extremities: normal strength, tone, and muscle mass   HEENT PERRLA and extra ocular movement intact   Mouth/Teeth mucous membranes moist, pharynx normal without lesions   Neck supple and no masses   Cardiovascular: regular rate and rhythm, no murmurs or gallops   Respiratory:  appears well, vitals normal, no respiratory distress, acyanotic, normal RR, ear and throat exam is normal,  neck free of mass or lymphadenopathy, chest clear, no wheezing, crepitations, rhonchi, normal symmetric air entry   Abdomen: soft, non-tender; bowel sounds normal; no masses,  no organomegaly   Urinary: urethral meatus normal      Assessment:    Pregnancy: [id number] Patient Active Problem List   Diagnosis Date Noted  . Supervision of high risk pregnancy, antepartum 06/12/2016  . Acne 03/04/2016  . Essential hypertension 11/14/2015  . Carpal tunnel syndrome 11/14/2015  . Anemia, unspecified 07/02/2014  . Tachycardia 03/13/2014  . Pain, heel 03/13/2014  . Chronic hypertension in pregnancy 11/14/2012  . Female circumcision 08/17/2012        Plan:     Initial labs drawn. Prenatal vitamins. Problem list reviewed and updated. Genetic Screening discussed Quad Screen: requested.  Ultrasound discussed; fetal survey: ordered.  Follow up in 4 weeks. 100% of 30 min visit spent on counseling and coordination of care.  Benzoyl peroxide for acne Start ASA 81mg  24hr urine, cmp   Naim Murtha JEHIEL 06/12/2016

## 2016-06-12 NOTE — Progress Notes (Signed)
Rash on face and upper chest Varicocities right inner knee

## 2016-06-13 LAB — PAIN MGMT, PROFILE 6 CONF W/O MM, U
6 Acetylmorphine: NEGATIVE ng/mL (ref <10)
AMPHETAMINES: NEGATIVE ng/mL (ref <500)
Alcohol Metabolites: NEGATIVE ng/mL (ref <500)
BARBITURATES: NEGATIVE ng/mL (ref <300)
Benzodiazepines: NEGATIVE ng/mL (ref <100)
COCAINE METABOLITE: NEGATIVE ng/mL (ref <150)
CREATININE: 133.7 mg/dL (ref >=20.0)
MARIJUANA METABOLITE: NEGATIVE ng/mL (ref <20)
Methadone Metabolite: NEGATIVE ng/mL (ref <100)
OPIATES: NEGATIVE ng/mL (ref <100)
OXYCODONE: NEGATIVE ng/mL (ref <100)
Oxidant: NEGATIVE ug/mL (ref <200)
PHENCYCLIDINE: NEGATIVE ng/mL (ref <25)
Please note:: 0
pH: 6.19 (ref 4.5–9.0)

## 2016-06-13 LAB — CULTURE, OB URINE

## 2016-06-15 ENCOUNTER — Other Ambulatory Visit: Payer: Self-pay

## 2016-06-15 DIAGNOSIS — O10912 Unspecified pre-existing hypertension complicating pregnancy, second trimester: Secondary | ICD-10-CM

## 2016-06-15 LAB — COMPREHENSIVE METABOLIC PANEL
ALT: 16 U/L (ref 6–29)
AST: 15 U/L (ref 10–30)
Albumin: 3.4 g/dL — ABNORMAL LOW (ref 3.6–5.1)
Alkaline Phosphatase: 61 U/L (ref 33–115)
BILIRUBIN TOTAL: 0.2 mg/dL (ref 0.2–1.2)
BUN: 7 mg/dL (ref 7–25)
CO2: 19 mmol/L — AB (ref 20–31)
CREATININE: 0.5 mg/dL (ref 0.50–1.10)
Calcium: 8.4 mg/dL — ABNORMAL LOW (ref 8.6–10.2)
Chloride: 105 mmol/L (ref 98–110)
GLUCOSE: 86 mg/dL (ref 65–99)
Potassium: 4 mmol/L (ref 3.5–5.3)
SODIUM: 136 mmol/L (ref 135–146)
Total Protein: 6.5 g/dL (ref 6.1–8.1)

## 2016-06-15 LAB — PRENATAL PROFILE (SOLSTAS)
ANTIBODY SCREEN: NEGATIVE
BASOS PCT: 0 %
Basophils Absolute: 0 cells/uL (ref 0–200)
EOS ABS: 234 {cells}/uL (ref 15–500)
EOS PCT: 3 %
HEMATOCRIT: 30.3 % — AB (ref 35.0–45.0)
HEMOGLOBIN: 9.8 g/dL — AB (ref 11.7–15.5)
HIV 1&2 Ab, 4th Generation: NONREACTIVE
Hepatitis B Surface Ag: NEGATIVE
Lymphocytes Relative: 29 %
Lymphs Abs: 2262 cells/uL (ref 850–3900)
MCH: 24.6 pg — AB (ref 27.0–33.0)
MCHC: 32.3 g/dL (ref 32.0–36.0)
MCV: 75.9 fL — AB (ref 80.0–100.0)
MONOS PCT: 6 %
MPV: 9.1 fL (ref 7.5–12.5)
Monocytes Absolute: 468 cells/uL (ref 200–950)
NEUTROS ABS: 4836 {cells}/uL (ref 1500–7800)
Neutrophils Relative %: 62 %
Platelets: 299 10*3/uL (ref 140–400)
RBC: 3.99 MIL/uL (ref 3.80–5.10)
RDW: 18 % — ABNORMAL HIGH (ref 11.0–15.0)
RH TYPE: POSITIVE
Rubella: 21.1 Index — ABNORMAL HIGH (ref <0.90)
WBC: 7.8 10*3/uL (ref 3.8–10.8)

## 2016-06-15 LAB — CYTOLOGY - PAP

## 2016-06-15 LAB — GC/CHLAMYDIA PROBE AMP (~~LOC~~) NOT AT ARMC
CHLAMYDIA, DNA PROBE: NEGATIVE
NEISSERIA GONORRHEA: NEGATIVE

## 2016-06-16 LAB — PROTEIN, URINE, 24 HOUR
Protein, 24H Urine: 146 mg/24 h (ref <150)
Protein, Urine: 11 mg/dL (ref 5–24)

## 2016-06-17 LAB — HEMOGLOBINOPATHY EVALUATION
HCT: 30.3 % — ABNORMAL LOW (ref 35.0–45.0)
HEMOGLOBIN: 9.8 g/dL — AB (ref 11.7–15.5)
HGB A2 QUANT: 2.3 % (ref 1.8–3.5)
HGB A: 96.7 % (ref >96.0)
MCH: 24.6 pg — ABNORMAL LOW (ref 27.0–33.0)
MCV: 75.9 fL — AB (ref 80.0–100.0)
RBC: 3.99 MIL/uL (ref 3.80–5.10)
RDW: 18 % — ABNORMAL HIGH (ref 11.0–15.0)

## 2016-06-18 LAB — CREATININE, URINE, 24 HOUR
Creatinine, 24H Ur: 1.5 g/(24.h) (ref 0.63–2.50)
Creatinine, Urine: 113 mg/dL (ref 20–320)

## 2016-07-14 ENCOUNTER — Ambulatory Visit (HOSPITAL_COMMUNITY): Admission: RE | Admit: 2016-07-14 | Payer: Medicaid Other | Source: Ambulatory Visit

## 2016-07-14 ENCOUNTER — Other Ambulatory Visit: Payer: Self-pay | Admitting: Family Medicine

## 2016-07-14 ENCOUNTER — Ambulatory Visit (HOSPITAL_COMMUNITY)
Admission: RE | Admit: 2016-07-14 | Discharge: 2016-07-14 | Disposition: A | Discharge status: Home / Self Care | Payer: Medicaid Other | Source: Ambulatory Visit | Attending: Family Medicine | Admitting: Family Medicine

## 2016-07-14 ENCOUNTER — Encounter (HOSPITAL_COMMUNITY): Payer: Self-pay

## 2016-07-14 DIAGNOSIS — O09522 Supervision of elderly multigravida, second trimester: Secondary | ICD-10-CM | POA: Diagnosis not present

## 2016-07-14 DIAGNOSIS — Z3A19 19 weeks gestation of pregnancy: Secondary | ICD-10-CM | POA: Diagnosis not present

## 2016-07-14 DIAGNOSIS — O10912 Unspecified pre-existing hypertension complicating pregnancy, second trimester: Secondary | ICD-10-CM | POA: Insufficient documentation

## 2016-07-14 DIAGNOSIS — O0992 Supervision of high risk pregnancy, unspecified, second trimester: Secondary | ICD-10-CM

## 2016-07-14 DIAGNOSIS — Z3689 Encounter for other specified antenatal screening: Secondary | ICD-10-CM

## 2016-07-14 DIAGNOSIS — Z36 Encounter for antenatal screening of mother: Secondary | ICD-10-CM | POA: Insufficient documentation

## 2016-07-16 ENCOUNTER — Ambulatory Visit (INDEPENDENT_AMBULATORY_CARE_PROVIDER_SITE_OTHER): Payer: Medicaid Other | Admitting: Family

## 2016-07-16 DIAGNOSIS — O10912 Unspecified pre-existing hypertension complicating pregnancy, second trimester: Secondary | ICD-10-CM

## 2016-07-16 DIAGNOSIS — O0992 Supervision of high risk pregnancy, unspecified, second trimester: Secondary | ICD-10-CM

## 2016-07-16 LAB — POCT URINALYSIS DIP (DEVICE)
BILIRUBIN URINE: NEGATIVE
Glucose, UA: NEGATIVE mg/dL
HGB URINE DIPSTICK: NEGATIVE
KETONES UR: NEGATIVE mg/dL
Leukocytes, UA: NEGATIVE
Nitrite: NEGATIVE
PH: 5.5 (ref 5.0–8.0)
Protein, ur: 30 mg/dL — AB
Urobilinogen, UA: 0.2 mg/dL (ref 0.0–1.0)

## 2016-07-16 NOTE — Progress Notes (Signed)
#  [id number] Language line interpreter

## 2016-07-16 NOTE — Progress Notes (Signed)
Subjective:  Carolyn Ramsey is a 40 y.o. G5P4004 at 3827w2d being seen today for ongoing prenatal care.  She is currently monitored for the following issues for this high-risk pregnancy and has Female circumcision; Chronic hypertension in pregnancy; Tachycardia; Pain, heel; Anemia, unspecified; Essential hypertension; Carpal tunnel syndrome; Acne; and Supervision of high risk pregnancy, antepartum on her problem list.  Patient reports no complaints.   .  .  Movement: Absent. Denies leaking of fluid.   The following portions of the patient's history were reviewed and updated as appropriate: allergies, current medications, past family history, past medical history, past social history, past surgical history and problem list. Problem list updated.  Objective:   Vitals:   07/16/16 1355  BP: 121/81  Pulse: (!) 109  Weight: 233 lb 14.4 oz (106.1 kg)    Fetal Status: Fetal Heart Rate (bpm): 151 Fundal Height: 20 cm Movement: Absent     General:  Alert, oriented and cooperative. Patient is in no acute distress.  Skin: Skin is warm and dry. No rash noted.   Cardiovascular: Normal heart rate noted  Respiratory: Normal respiratory effort, no problems with respiration noted  Abdomen: Soft, gravid, appropriate for gestational age. Pain/Pressure: Absent     Pelvic:  Cervical exam deferred        Extremities: Normal range of motion.  Edema: None  Mental Status: Normal mood and affect. Normal behavior. Normal judgment and thought content.   Urinalysis:    Protein 1+ Glucose Negative  Assessment and Plan:  Pregnancy: G5P4004 at 1427w2d  1. Supervision of high risk pregnancy, antepartum, second trimester - Reviewed ultrasound results  2. Chronic hypertension in pregnancy, second trimester - Continue labetalol   Preterm labor symptoms and general obstetric precautions including but not limited to vaginal bleeding, contractions, leaking of fluid and fetal movement were reviewed in detail with the  patient. Please refer to After Visit Summary for other counseling recommendations.  Return in about 3 weeks (around 08/06/2016).   Eino FarberWalidah Kennith GainN Karim, CNM

## 2016-08-06 ENCOUNTER — Ambulatory Visit (INDEPENDENT_AMBULATORY_CARE_PROVIDER_SITE_OTHER): Payer: Medicaid Other | Admitting: Obstetrics and Gynecology

## 2016-08-06 VITALS — BP 124/81 | HR 98 | Temp 98.6°F | Wt 235.8 lb

## 2016-08-06 DIAGNOSIS — O1092 Unspecified pre-existing hypertension complicating childbirth: Secondary | ICD-10-CM

## 2016-08-06 DIAGNOSIS — O0992 Supervision of high risk pregnancy, unspecified, second trimester: Secondary | ICD-10-CM

## 2016-08-06 DIAGNOSIS — O09522 Supervision of elderly multigravida, second trimester: Secondary | ICD-10-CM

## 2016-08-06 DIAGNOSIS — O10912 Unspecified pre-existing hypertension complicating pregnancy, second trimester: Secondary | ICD-10-CM

## 2016-08-06 DIAGNOSIS — Z789 Other specified health status: Secondary | ICD-10-CM

## 2016-08-06 DIAGNOSIS — Z23 Encounter for immunization: Secondary | ICD-10-CM

## 2016-08-06 DIAGNOSIS — N90810 Female genital mutilation status, unspecified: Secondary | ICD-10-CM

## 2016-08-06 DIAGNOSIS — Z603 Acculturation difficulty: Secondary | ICD-10-CM

## 2016-08-06 DIAGNOSIS — I10 Essential (primary) hypertension: Secondary | ICD-10-CM

## 2016-08-06 LAB — POCT URINALYSIS DIP (DEVICE)
BILIRUBIN URINE: NEGATIVE
GLUCOSE, UA: NEGATIVE mg/dL
Hgb urine dipstick: NEGATIVE
KETONES UR: NEGATIVE mg/dL
Leukocytes, UA: NEGATIVE
NITRITE: NEGATIVE
PH: 6 (ref 5.0–8.0)
Protein, ur: NEGATIVE mg/dL
Specific Gravity, Urine: >=1.03 (ref 1.005–1.030)
Urobilinogen, UA: 0.2 mg/dL (ref 0.0–1.0)

## 2016-08-06 MED ORDER — RANITIDINE HCL 150 MG PO TABS: 150.0000 mg | ORAL_TABLET | Freq: Two times a day (BID) | ORAL | 2 refills | Status: DC

## 2016-08-06 MED ORDER — PRENATAL VITAMINS PLUS 27-1 MG PO TABS: 1.0000 | ORAL_TABLET | Freq: Every day | ORAL | 3 refills | Status: DC

## 2016-08-06 MED ORDER — LABETALOL HCL 100 MG PO TABS
100.0000 mg | ORAL_TABLET | Freq: Two times a day (BID) | ORAL | 2 refills | Status: DC
Start: 2016-08-06 — End: 2016-11-05

## 2016-08-06 NOTE — Progress Notes (Signed)
Pt c/o weakness and tiredness the last 2 days Also c/o burning sensation in lower legs Also c/o severe heartburn Cold sx, no fever; Wants flu shot today

## 2016-08-06 NOTE — Progress Notes (Signed)
Prenatal Visit Note Date: 08/06/2016 [facility]  Subjective:  John Giovannibttsam Debarr is a 40 y.o. G5P4004 at 9359w2d being seen today for ongoing prenatal care.  She is currently monitored for the following issues for this high-risk pregnancy and has Female circumcision; Chronic hypertension in pregnancy; Anemia, unspecified; Essential hypertension; Carpal tunnel syndrome; Acne; Supervision of high risk pregnancy, antepartum; and Language barrier on her problem list., AMA  Patient reports GERD, some fatigue, occasional LE neuropathy   Contractions: Not present. Vag. Bleeding: None.  Movement: Present. Denies leaking of fluid.   The following portions of the patient's history were reviewed and updated as appropriate: allergies, current medications, past family history, past medical history, past social history, past surgical history and problem list. Problem list updated.  Objective:   Vitals:   08/06/16 0952  BP: 124/81  Pulse: 98  Temp: 98.6 F (37 C)  Weight: 235 lb 12.8 oz (107 kg)    Fetal Status: Fetal Heart Rate (bpm): 140   Movement: Present     General:  Alert, oriented and cooperative. Patient is in no acute distress.  Skin: Skin is warm and dry. No rash noted.   Cardiovascular: Normal heart rate noted  Respiratory: Normal respiratory effort, no problems with respiration noted  Abdomen: Soft, gravid, appropriate for gestational age. Pain/Pressure: Absent     Pelvic:  Cervical exam deferred        Extremities: Normal range of motion.  Edema: None  Mental Status: Normal mood and affect. Normal behavior. Normal judgment and thought content.   Urinalysis: Urine Protein: Negative Urine Glucose: Negative  Assessment and Plan:  Pregnancy: G5P4004 at 4259w2d  1. IUP Routine PNC. Flu shot today. Zantac prescribed.  Fatigue s/s don't seem concerning but patient offered to check cbc today but is okay holding off until 28wk labs Advised belly binder as can  cause neuropathy esp given weight and multip status  2. Chronic hypertension complicating or reason for care during childbirth Growth u/s scheduled for 2-3wks from now. Continue with labetalol 100 bid - US MFM OB FOLLOW UP; Future  3. AMA Declined to see genetics or genetics screening  4.Language barrier Interpreter used   Preterm labor symptoms and general obstetric precautions including but not limited to vaginal bleeding, contractions, leaking of fluid and fetal movement were reviewed in detail with the patient. Please refer to After Visit Summary for other counseling recommendations.  Return in about 3 weeks (around 08/27/2016).   [place] Bingharlie Carlee Tesfaye, MD

## 2016-08-26 ENCOUNTER — Ambulatory Visit (HOSPITAL_COMMUNITY)
Admission: RE | Admit: 2016-08-26 | Discharge: 2016-08-26 | Disposition: A | Discharge status: Home / Self Care | Payer: Medicaid Other | Source: Ambulatory Visit | Attending: Obstetrics and Gynecology | Admitting: Obstetrics and Gynecology

## 2016-08-26 ENCOUNTER — Other Ambulatory Visit (HOSPITAL_COMMUNITY): Payer: Self-pay | Admitting: *Deleted

## 2016-08-26 ENCOUNTER — Encounter (HOSPITAL_COMMUNITY): Payer: Self-pay

## 2016-08-26 DIAGNOSIS — O1092 Unspecified pre-existing hypertension complicating childbirth: Secondary | ICD-10-CM

## 2016-08-26 DIAGNOSIS — O99212 Obesity complicating pregnancy, second trimester: Secondary | ICD-10-CM | POA: Diagnosis not present

## 2016-08-26 DIAGNOSIS — O09529 Supervision of elderly multigravida, unspecified trimester: Secondary | ICD-10-CM

## 2016-08-26 DIAGNOSIS — O10012 Pre-existing essential hypertension complicating pregnancy, second trimester: Secondary | ICD-10-CM | POA: Diagnosis not present

## 2016-08-26 DIAGNOSIS — O09522 Supervision of elderly multigravida, second trimester: Secondary | ICD-10-CM | POA: Diagnosis not present

## 2016-08-26 DIAGNOSIS — Z3A25 25 weeks gestation of pregnancy: Secondary | ICD-10-CM | POA: Insufficient documentation

## 2016-08-31 ENCOUNTER — Ambulatory Visit (INDEPENDENT_AMBULATORY_CARE_PROVIDER_SITE_OTHER): Payer: Medicaid Other | Admitting: Certified Nurse Midwife

## 2016-08-31 VITALS — BP 138/84 | HR 99 | Wt 238.7 lb

## 2016-08-31 DIAGNOSIS — O09522 Supervision of elderly multigravida, second trimester: Secondary | ICD-10-CM

## 2016-08-31 DIAGNOSIS — O10912 Unspecified pre-existing hypertension complicating pregnancy, second trimester: Secondary | ICD-10-CM

## 2016-08-31 DIAGNOSIS — O0990 Supervision of high risk pregnancy, unspecified, unspecified trimester: Secondary | ICD-10-CM

## 2016-08-31 DIAGNOSIS — O10919 Unspecified pre-existing hypertension complicating pregnancy, unspecified trimester: Secondary | ICD-10-CM

## 2016-08-31 MED ORDER — PRENATAL VITAMIN 27-0.8 MG PO TABS: 1.0000 | ORAL_TABLET | Freq: Every day | ORAL | 6 refills | Status: DC

## 2016-08-31 NOTE — Progress Notes (Signed)
Arabic video interpreter "[name]" [phone] used

## 2016-08-31 NOTE — Progress Notes (Signed)
Subjective:  Carolyn Ramsey is a 40 y.o. G5P4004 at 876w6d being seen today for ongoing prenatal care.  She is currently monitored for the following issues for this high-risk pregnancy and has Female circumcision; Chronic hypertension in pregnancy; Essential hypertension; Supervision of high risk pregnancy, antepartum; and Language barrier on her problem list.  Patient reports edema of LE.  Contractions: Not present. Vag. Bleeding: None.  Movement: Present. Denies leaking of fluid.   The following portions of the patient's history were reviewed and updated as appropriate: allergies, current medications, past family history, past medical history, past social history, past surgical history and problem list. Problem list updated.  Objective:   Vitals:   08/31/16 0939  BP: 138/84  Pulse: 99  Weight: 238 lb 11.2 oz (108.3 kg)    Fetal Status: Fetal Heart Rate (bpm): 152 Fundal Height: 25 cm Movement: Present  Presentation: Undeterminable  General:  Alert, oriented and cooperative. Patient is in no acute distress.  Skin: Skin is warm and dry. No rash noted.   Cardiovascular: Normal heart rate noted  Respiratory: Normal respiratory effort, no problems with respiration noted  Abdomen: Soft, gravid, appropriate for gestational age. Pain/Pressure: Absent     Pelvic: Vag. Bleeding: None     Cervical exam deferred        Extremities: Normal range of motion.  Edema: None  Mental Status: Normal mood and affect. Normal behavior. Normal judgment and thought content.   Urinalysis:      Assessment and Plan:  Pregnancy: G5P4004 at 476w6d  1. Chronic hypertension in pregnancy - continue Labetalol 100 mg bid - continue ASA - growth US and AT at 28 wks  2. Elderly multigravida in second trimester   Preterm labor symptoms and general obstetric precautions including but not limited to vaginal bleeding, contractions, leaking of fluid and fetal movement were reviewed in detail with the patient. Please  refer to After Visit Summary for other counseling recommendations.  Return in about 3 weeks (around 09/21/2016) for ROB with NST/BPP/AFI.   Donette LarryMelanie Alyxis Grippi, CNM

## 2016-09-23 ENCOUNTER — Other Ambulatory Visit (HOSPITAL_COMMUNITY): Payer: Self-pay | Admitting: *Deleted

## 2016-09-23 ENCOUNTER — Ambulatory Visit (HOSPITAL_COMMUNITY)
Admission: RE | Admit: 2016-09-23 | Discharge: 2016-09-23 | Disposition: A | Discharge status: Home / Self Care | Payer: Medicaid Other | Source: Ambulatory Visit | Attending: Obstetrics and Gynecology | Admitting: Obstetrics and Gynecology

## 2016-09-23 DIAGNOSIS — O99213 Obesity complicating pregnancy, third trimester: Secondary | ICD-10-CM | POA: Insufficient documentation

## 2016-09-23 DIAGNOSIS — O10919 Unspecified pre-existing hypertension complicating pregnancy, unspecified trimester: Secondary | ICD-10-CM

## 2016-09-23 DIAGNOSIS — Z3A29 29 weeks gestation of pregnancy: Secondary | ICD-10-CM | POA: Diagnosis not present

## 2016-09-23 DIAGNOSIS — O09523 Supervision of elderly multigravida, third trimester: Secondary | ICD-10-CM | POA: Insufficient documentation

## 2016-09-23 DIAGNOSIS — O10013 Pre-existing essential hypertension complicating pregnancy, third trimester: Secondary | ICD-10-CM | POA: Insufficient documentation

## 2016-09-23 DIAGNOSIS — O09529 Supervision of elderly multigravida, unspecified trimester: Secondary | ICD-10-CM

## 2016-09-28 ENCOUNTER — Other Ambulatory Visit: Payer: Self-pay

## 2016-09-28 ENCOUNTER — Ambulatory Visit (INDEPENDENT_AMBULATORY_CARE_PROVIDER_SITE_OTHER): Payer: Medicaid Other | Admitting: Obstetrics and Gynecology

## 2016-09-28 VITALS — BP 123/76 | HR 101 | Wt 237.5 lb

## 2016-09-28 DIAGNOSIS — O0993 Supervision of high risk pregnancy, unspecified, third trimester: Secondary | ICD-10-CM

## 2016-09-28 DIAGNOSIS — O10913 Unspecified pre-existing hypertension complicating pregnancy, third trimester: Secondary | ICD-10-CM

## 2016-09-28 DIAGNOSIS — O10919 Unspecified pre-existing hypertension complicating pregnancy, unspecified trimester: Secondary | ICD-10-CM

## 2016-09-28 DIAGNOSIS — Z789 Other specified health status: Secondary | ICD-10-CM

## 2016-09-28 DIAGNOSIS — O0990 Supervision of high risk pregnancy, unspecified, unspecified trimester: Secondary | ICD-10-CM

## 2016-09-28 LAB — POCT URINALYSIS DIP (DEVICE)
BILIRUBIN URINE: NEGATIVE
Glucose, UA: NEGATIVE mg/dL
HGB URINE DIPSTICK: NEGATIVE
KETONES UR: NEGATIVE mg/dL
Nitrite: NEGATIVE
PH: 7 (ref 5.0–8.0)
Protein, ur: NEGATIVE mg/dL
SPECIFIC GRAVITY, URINE: 1.02 (ref 1.005–1.030)
Urobilinogen, UA: 0.2 mg/dL (ref 0.0–1.0)

## 2016-09-28 LAB — CBC
HEMATOCRIT: 29.8 % — AB (ref 35.0–45.0)
HEMOGLOBIN: 9.3 g/dL — AB (ref 11.7–15.5)
MCH: 24.7 pg — AB (ref 27.0–33.0)
MCHC: 31.2 g/dL — ABNORMAL LOW (ref 32.0–36.0)
MCV: 79.3 fL — AB (ref 80.0–100.0)
MPV: 9.2 fL (ref 7.5–12.5)
PLATELETS: 258 10*3/uL (ref 140–400)
RBC: 3.76 MIL/uL — AB (ref 3.80–5.10)
RDW: 15.4 % — ABNORMAL HIGH (ref 11.0–15.0)
WBC: 8.6 10*3/uL (ref 3.8–10.8)

## 2016-09-28 MED ORDER — PRENATAL VITAMIN 27-0.8 MG PO TABS: 1.0000 | ORAL_TABLET | Freq: Every day | ORAL | 3 refills | Status: DC

## 2016-09-28 MED ORDER — RANITIDINE HCL 150 MG PO TABS: 150.0000 mg | ORAL_TABLET | Freq: Two times a day (BID) | ORAL | 3 refills | Status: DC

## 2016-09-28 NOTE — Progress Notes (Signed)
Prenatal Visit Note Date: 09/28/2016 [facility]  Subjective:  Carolyn Ramsey is a 40 y.o. G5P4004 at 453w6d being seen today for ongoing prenatal care.  She is currently monitored for the following issues for this high-risk pregnancy and has Female circumcision; Chronic hypertension in pregnancy; Essential hypertension; Supervision of high risk pregnancy, antepartum; and Language barrier on her problem list.  Patient reports no complaints.   Contractions: Not present. Vag. Bleeding: None.  Movement: Present. Denies leaking of fluid.   The following portions of the patient's history were reviewed and updated as appropriate: allergies, current medications, past family history, past medical history, past social history, past surgical history and problem list. Problem list updated.  Objective:   Vitals:   09/28/16 0855  BP: 123/76  Pulse: (!) 101  Weight: 237 lb 8 oz (107.7 kg)    Fetal Status: Fetal Heart Rate (bpm): 143   Movement: Present     General:  Alert, oriented and cooperative. Patient is in no acute distress.  Skin: Skin is warm and dry. No rash noted.   Cardiovascular: Normal heart rate noted  Respiratory: Normal respiratory effort, no problems with respiration noted  Abdomen: Soft, gravid, appropriate for gestational age. Pain/Pressure: Absent     Pelvic:  Cervical exam deferred        Extremities: Normal range of motion.  Edema: None  Mental Status: Normal mood and affect. Normal behavior. Normal judgment and thought content.   Urinalysis:      Assessment and Plan:  Pregnancy: G5P4004 at 7253w6d  1. Supervision of high risk pregnancy in third trimester Routine care. Zantac sent in and pnv with request for vegan type sent in to pharmacy - Glucose tolerance, 2 hours - RPR - CBC - HIV antibody (with reflex)  2.  Language barrier Interpreter used  4. Chronic hypertension in pregnancy Normal growth scan last week. Continue with  labetalol 100 bid. Start ap testing at 32wks.   Preterm labor symptoms and general obstetric precautions including but not limited to vaginal bleeding, contractions, leaking of fluid and fetal movement were reviewed in detail with the patient. Please refer to After Visit Summary for other counseling recommendations.  Return in about 2 weeks (around 10/12/2016).   Bee Bingharlie Keyshia Orwick, MD

## 2016-09-28 NOTE — Progress Notes (Signed)
28 week labs with 2 hr gtt C/o weakness, sob, has not been taking pnv because of gelatin, want a new prescription

## 2016-09-29 LAB — GLUCOSE TOLERANCE, 2 HOURS
Glucose, 2 hour: 120 mg/dL (ref <140)
Glucose, Fasting: 73 mg/dL (ref 65–99)

## 2016-09-29 LAB — RPR

## 2016-09-29 LAB — HIV ANTIBODY (ROUTINE TESTING W REFLEX): HIV 1&2 Ab, 4th Generation: NONREACTIVE

## 2016-10-05 NOTE — Addendum Note (Signed)
Encounter addended by: Heidi DachMelanie A Auriel Kist, RN on: 10/05/2016  7:46 AM<BR>    Actions taken: Charge Capture section accepted

## 2016-10-13 ENCOUNTER — Ambulatory Visit (INDEPENDENT_AMBULATORY_CARE_PROVIDER_SITE_OTHER): Payer: Medicaid Other | Admitting: Advanced Practice Midwife

## 2016-10-13 VITALS — BP 134/83 | HR 102 | Wt 238.0 lb

## 2016-10-13 DIAGNOSIS — O0993 Supervision of high risk pregnancy, unspecified, third trimester: Secondary | ICD-10-CM

## 2016-10-13 DIAGNOSIS — O10913 Unspecified pre-existing hypertension complicating pregnancy, third trimester: Secondary | ICD-10-CM

## 2016-10-13 DIAGNOSIS — O10919 Unspecified pre-existing hypertension complicating pregnancy, unspecified trimester: Secondary | ICD-10-CM

## 2016-10-13 DIAGNOSIS — O99013 Anemia complicating pregnancy, third trimester: Secondary | ICD-10-CM

## 2016-10-13 DIAGNOSIS — Z603 Acculturation difficulty: Secondary | ICD-10-CM

## 2016-10-13 DIAGNOSIS — Z789 Other specified health status: Secondary | ICD-10-CM

## 2016-10-13 DIAGNOSIS — D649 Anemia, unspecified: Secondary | ICD-10-CM

## 2016-10-13 DIAGNOSIS — L708 Other acne: Secondary | ICD-10-CM

## 2016-10-13 MED ORDER — CLINDAMYCIN PHOS-BENZOYL PEROX 1-5 % EX GEL: Freq: Two times a day (BID) | CUTANEOUS | 2 refills | Status: DC

## 2016-10-13 MED ORDER — DOCUSATE SODIUM 100 MG PO CAPS: 100.0000 mg | ORAL_CAPSULE | Freq: Two times a day (BID) | ORAL | 2 refills | Status: DC | PRN

## 2016-10-13 MED ORDER — POLYETHYLENE GLYCOL 3350 17 GM/SCOOP PO POWD: 17.0000 g | Freq: Every day | ORAL | 0 refills | Status: DC

## 2016-10-13 MED ORDER — FERROUS SULFATE 325 (65 FE) MG PO TABS: 325.0000 mg | ORAL_TABLET | Freq: Every day | ORAL | 1 refills | Status: DC

## 2016-10-13 NOTE — Patient Instructions (Addendum)
Hypertension During Pregnancy °Hypertension, commonly called high blood pressure, is when the force of blood pumping through your arteries is too strong. Arteries are blood vessels that carry blood from the heart throughout the body. Hypertension during pregnancy can cause problems for you and your baby. Your baby may be born early (prematurely) or may not weigh as much as he or she should at birth. Very bad cases of hypertension during pregnancy can be life-threatening. °Different types of hypertension can occur during pregnancy. These include: °· Chronic hypertension. This happens when: °¨ You have hypertension before pregnancy and it continues during pregnancy. °¨ You develop hypertension before you are [redacted] weeks pregnant, and it continues during pregnancy. °· Gestational hypertension. This is hypertension that develops after the 20th week of pregnancy. °· Preeclampsia, also called toxemia of pregnancy. This is a very serious type of hypertension that develops only during pregnancy. It affects the whole body, and it can be very dangerous for you and your baby. °Gestational hypertension and preeclampsia usually go away within 6 weeks after your baby is born. Women who have hypertension during pregnancy have a greater chance of developing hypertension later in life or during future pregnancies. °What are the causes? °The exact cause of hypertension is not known. °What increases the risk? °There are certain factors that make it more likely for you to develop hypertension during pregnancy. These include: °· Having hypertension during a previous pregnancy or prior to pregnancy. °· Being overweight. °· Being older than age 40. °· Being pregnant for the first time or being pregnant with more than one baby. °· Becoming pregnant using fertilization methods such as IVF (in vitro fertilization). °· Having diabetes, kidney problems, or systemic lupus erythematosus. °· Having a family history of hypertension. °What are the  signs or symptoms? °Chronic hypertension and gestational hypertension rarely cause symptoms. Preeclampsia causes symptoms, which may include: °· Increased protein in your urine. Your health care provider will check for this at every visit before you give birth (prenatal visit). °· Severe headaches. °· Sudden weight gain. °· Swelling of the hands, face, legs, and feet. °· Nausea and vomiting. °· Vision problems, such as blurred or double vision. °· Numbness in the face, arms, legs, and feet. °· Dizziness. °· Slurred speech. °· Sensitivity to bright lights. °· Abdominal pain. °· Convulsions. °How is this diagnosed? °You may be diagnosed with hypertension during a routine prenatal exam. At each prenatal visit, you may: °· Have a urine test to check for high amounts of protein in your urine. °· Have your blood pressure checked. A blood pressure reading is recorded as two numbers, such as "120 over 80" (or 120/80). The first ("top") number is called the systolic pressure. It is a measure of the pressure in your arteries when your heart beats. The second ("bottom") number is called the diastolic pressure. It is a measure of the pressure in your arteries as your heart relaxes between beats. Blood pressure is measured in a unit called mm Hg. A normal blood pressure reading is: °¨ Systolic: below 120. °¨ Diastolic: below 80. °The type of hypertension that you are diagnosed with depends on your test results and when your symptoms developed. °· Chronic hypertension is usually diagnosed before 20 weeks of pregnancy. °· Gestational hypertension is usually diagnosed after 20 weeks of pregnancy. °· Hypertension with high amounts of protein in the urine is diagnosed as preeclampsia. °· Blood pressure measurements that stay above 160 systolic, or above 110 diastolic, are signs of severe preeclampsia. °  How is this treated? Treatment for hypertension during pregnancy varies depending on the type of hypertension you have and how  serious it is.  If you take medicines called ACE inhibitors to treat chronic hypertension, you may need to switch medicines. ACE inhibitors should not be taken during pregnancy.  If you have gestational hypertension, you may need to take blood pressure medicine.  If you are at risk for preeclampsia, your health care provider may recommend that you take a low-dose aspirin every day to prevent high blood pressure during your pregnancy.  If you have severe preeclampsia, you may need to be hospitalized so you and your baby can be monitored closely. You may also need to take medicine (magnesium sulfate) to prevent seizures and to lower blood pressure. This medicine may be given as an injection or through an IV tube.  In some cases, if your condition gets worse, you may need to deliver your baby early. Follow these instructions at home: Eating and drinking  Drink enough fluid to keep your urine clear or pale yellow.  Eat a healthy diet that is low in salt (sodium). Do not add salt to your food. Check food labels to see how much sodium a food or beverage contains. Lifestyle  Do not use any products that contain nicotine or tobacco, such as cigarettes and e-cigarettes. If you need help quitting, ask your health care provider.  Do not use alcohol.  Avoid caffeine.  Avoid stress as much as possible. Rest and get plenty of sleep. General instructions  Take over-the-counter and prescription medicines only as told by your health care provider.  While lying down, lie on your left side. This keeps pressure off your baby.  While sitting or lying down, raise (elevate) your feet. Try putting some pillows under your lower legs.  Exercise regularly. Ask your health care provider what kinds of exercise are best for you.  Keep all prenatal and follow-up visits as told by your health care provider. This is important. Contact a health care provider if:  You have symptoms that your health care provider  told you may require more treatment or monitoring, such as:  Fever.  Vomiting.  Headache. Get help right away if:  You have severe abdominal pain or vomiting that does not get better with treatment.  You suddenly develop swelling in your hands, ankles, or face.  You gain 4 lbs (1.8 kg) or more in 1 week.  You develop vaginal bleeding, or you have blood in your urine.  You do not feel your baby moving as much as usual.  You have blurred or double vision.  You have muscle twitching or sudden tightening (spasms).  You have shortness of breath.  Your lips or fingernails turn blue. This information is not intended to replace advice given to you by your health care provider. Make sure you discuss any questions you have with your health care provider. Document Released: 08/04/2011 Document Revised: 06/05/2016 Document Reviewed: 05/01/2016 Elsevier Interactive Patient Education  2017 Elsevier Inc.   High-Fiber Diet Fiber, also called dietary fiber, is a type of carbohydrate found in fruits, vegetables, whole grains, and beans. A high-fiber diet can have many health benefits. Your health care provider may recommend a high-fiber diet to help:  Prevent constipation. Fiber can make your bowel movements more regular.  Lower your cholesterol.  Relieve hemorrhoids, uncomplicated diverticulosis, or irritable bowel syndrome.  Prevent overeating as part of a weight-loss plan.  Prevent heart disease, type 2 diabetes, and certain  cancers. What is my plan? The recommended daily intake of fiber includes:  38 grams for men under age 10650.  30 grams for men over age 38.  25 grams for women under age 350.  21 grams for women over age 38. You can get the recommended daily intake of dietary fiber by eating a variety of fruits, vegetables, grains, and beans. Your health care provider may also recommend a fiber supplement if it is not possible to get enough fiber through your diet. What do I  need to know about a high-fiber diet?  Fiber supplements have not been widely studied for their effectiveness, so it is better to get fiber through food sources.  Always check the fiber content on thenutrition facts label of any prepackaged food. Look for foods that contain at least 5 grams of fiber per serving.  Ask your dietitian if you have questions about specific foods that are related to your condition, especially if those foods are not listed in the following section.  Increase your daily fiber consumption gradually. Increasing your intake of dietary fiber too quickly may cause bloating, cramping, or gas.  Drink plenty of water. Water helps you to digest fiber. What foods can I eat? Grains  Whole-grain breads. Multigrain cereal. Oats and oatmeal. Brown rice. Barley. Bulgur wheat. Millet. Bran muffins. Popcorn. Rye wafer crackers. Vegetables  Sweet potatoes. Spinach. Kale. Artichokes. Cabbage. Broccoli. Green peas. Carrots. Squash. Fruits  Berries. Pears. Apples. Oranges. Avocados. Prunes and raisins. Dried figs. Meats and Other Protein Sources  Navy, kidney, pinto, and soy beans. Split peas. Lentils. Nuts and seeds. Dairy  Fiber-fortified yogurt. Beverages  Fiber-fortified soy milk. Fiber-fortified orange juice. Other  Fiber bars. The items listed above may not be a complete list of recommended foods or beverages. Contact your dietitian for more options.  What foods are not recommended? Grains  White bread. Pasta made with refined flour. White rice. Vegetables  Fried potatoes. Canned vegetables. Well-cooked vegetables. Fruits  Fruit juice. Cooked, strained fruit. Meats and Other Protein Sources  Fatty cuts of meat. Fried Environmental education officerpoultry or fried fish. Dairy  Milk. Yogurt. Cream cheese. Sour cream. Beverages  Soft drinks. Other  Cakes and pastries. Butter and oils. The items listed above may not be a complete list of foods and beverages to avoid. Contact your dietitian for  more information.  What are some tips for including high-fiber foods in my diet?  Eat a wide variety of high-fiber foods.  Make sure that half of all grains consumed each day are whole grains.  Replace breads and cereals made from refined flour or white flour with whole-grain breads and cereals.  Replace white rice with brown rice, bulgur wheat, or millet.  Start the day with a breakfast that is high in fiber, such as a cereal that contains at least 5 grams of fiber per serving.  Use beans in place of meat in soups, salads, or pasta.  Eat high-fiber snacks, such as berries, raw vegetables, nuts, or popcorn. This information is not intended to replace advice given to you by your health care provider. Make sure you discuss any questions you have with your health care provider. Document Released: 11/16/2005 Document Revised: 04/23/2016 Document Reviewed: 05/01/2014 Elsevier Interactive Patient Education  2017 [employer]

## 2016-10-15 ENCOUNTER — Other Ambulatory Visit: Payer: Medicaid Other

## 2016-10-15 ENCOUNTER — Other Ambulatory Visit: Payer: Self-pay | Admitting: Advanced Practice Midwife

## 2016-10-15 ENCOUNTER — Ambulatory Visit (HOSPITAL_COMMUNITY)
Admission: RE | Admit: 2016-10-15 | Discharge: 2016-10-15 | Disposition: A | Discharge status: Home / Self Care | Payer: Medicaid Other | Source: Ambulatory Visit | Attending: Advanced Practice Midwife | Admitting: Advanced Practice Midwife

## 2016-10-15 ENCOUNTER — Ambulatory Visit (INDEPENDENT_AMBULATORY_CARE_PROVIDER_SITE_OTHER): Payer: Medicaid Other | Admitting: Advanced Practice Midwife

## 2016-10-15 VITALS — BP 133/84 | HR 104 | Wt 238.0 lb

## 2016-10-15 DIAGNOSIS — O10919 Unspecified pre-existing hypertension complicating pregnancy, unspecified trimester: Secondary | ICD-10-CM

## 2016-10-15 DIAGNOSIS — O288 Other abnormal findings on antenatal screening of mother: Secondary | ICD-10-CM

## 2016-10-15 DIAGNOSIS — O09523 Supervision of elderly multigravida, third trimester: Secondary | ICD-10-CM | POA: Diagnosis not present

## 2016-10-15 DIAGNOSIS — O0993 Supervision of high risk pregnancy, unspecified, third trimester: Secondary | ICD-10-CM

## 2016-10-15 DIAGNOSIS — O10913 Unspecified pre-existing hypertension complicating pregnancy, third trimester: Secondary | ICD-10-CM

## 2016-10-15 DIAGNOSIS — Z3A32 32 weeks gestation of pregnancy: Secondary | ICD-10-CM | POA: Insufficient documentation

## 2016-10-15 DIAGNOSIS — O10013 Pre-existing essential hypertension complicating pregnancy, third trimester: Secondary | ICD-10-CM | POA: Insufficient documentation

## 2016-10-15 DIAGNOSIS — O99213 Obesity complicating pregnancy, third trimester: Secondary | ICD-10-CM | POA: Diagnosis not present

## 2016-10-15 NOTE — Progress Notes (Signed)
   PRENATAL VISIT NOTE  Subjective:  Carolyn Ramsey is a 40 y.o. G5P4004 at 7488w2d being seen today for ongoing prenatal care.  She is currently monitored for the following issues for this high-risk pregnancy and has Female circumcision; Chronic hypertension in pregnancy; Essential hypertension; Supervision of high risk pregnancy, antepartum; and Language barrier on her problem list.  Patient reports no complaints.  Contractions: Not present. Vag. Bleeding: None.  Movement: Present. Denies leaking of fluid.   The following portions of the patient's history were reviewed and updated as appropriate: allergies, current medications, past family history, past medical history, past social history, past surgical history and problem list. Problem list updated.  Objective:   Vitals:   10/15/16 0910  BP: 133/84  Pulse: (!) 104  Weight: 238 lb (108 kg)    Fetal Status: Fetal Heart Rate (bpm): NST non-reactive 10x10 accels present after prolonged monitoring. Pos FM heard and palpated.   Movement: Present     General:  Alert, oriented and cooperative. Patient is in no acute distress.  Skin: Skin is warm and dry. No rash noted.   Cardiovascular: Normal heart rate noted  Respiratory: Normal respiratory effort, no problems with respiration noted  Abdomen: Soft, gravid, appropriate for gestational age. Pain/Pressure: Absent     Pelvic:  Cervical exam deferred        Extremities: Normal range of motion.     Mental Status: Normal mood and affect. Normal behavior. Normal judgment and thought content.   Assessment and Plan:  Pregnancy: G5P4004 at 5288w2d  1. Supervision of high risk pregnancy in third trimester  - US MFM FETAL BPP W/NONSTRESS; Future  2. Chronic hypertension in pregnancy  - US MFM FETAL BPP W/NONSTRESS; Future  3. Non-reactive NST (non-stress test)  - US MFM FETAL BPP W/NONSTRESS; Future  Preterm labor symptoms and general obstetric precautions including but not limited to  vaginal bleeding, contractions, leaking of fluid and fetal movement were reviewed in detail with the patient. Please refer to After Visit Summary for other counseling recommendations.  Return in about 4 days (around 10/19/2016) for change appt on 11/21 to 11/20 in am (0940) NST only).  Pt escorted to MFM for BPP. Pt initially refusing. Raising voice. Did not discuss needing to repeat GTT. Due to pt's aggressive behavior and wanting to get BPP done.    Dorathy KinsmanVirginia Jhada Risk, CNM

## 2016-10-15 NOTE — Progress Notes (Signed)
Video interpreter [phone]#[id number] used for encounter.

## 2016-10-15 NOTE — Progress Notes (Signed)
   PRENATAL VISIT NOTE  Subjective:  Carolyn Ramsey is a 40 y.o. G5P4004 at 1045w2d being seen today for ongoing prenatal care.  She is currently monitored for the following issues for this high-risk pregnancy and has Female circumcision; Chronic hypertension in pregnancy; Essential hypertension; Supervision of high risk pregnancy, antepartum; and Language barrier on her problem list.  Patient reports skin concerns, acne treated with topical medication and she desires refill..  Contractions: Not present. Vag. Bleeding: None.  Movement: Present. Denies leaking of fluid.   The following portions of the patient's history were reviewed and updated as appropriate: allergies, current medications, past family history, past medical history, past social history, past surgical history and problem list. Problem list updated.  Objective:   Vitals:   10/13/16 1114  BP: 134/83  Pulse: (!) 102  Weight: 238 lb (108 kg)    Fetal Status: Fetal Heart Rate (bpm): 140   Movement: Present     General:  Alert, oriented and cooperative. Patient is in no acute distress.  Skin: Skin is warm and dry. No rash noted.   Cardiovascular: Normal heart rate noted  Respiratory: Normal respiratory effort, no problems with respiration noted  Abdomen: Soft, gravid, appropriate for gestational age. Pain/Pressure: Absent     Pelvic:  Cervical exam deferred        Extremities: Normal range of motion.  Edema: None  Mental Status: Normal mood and affect. Normal behavior. Normal judgment and thought content.   Assessment and Plan:  Pregnancy: G5P4004 at 825w2d  1. Supervision of high risk pregnancy in third trimester   2. Language barrier   3. Chronic hypertension in pregnancy --NST reactive today.  4. Other acne  - clindamycin-benzoyl peroxide (BENZACLIN) gel; Apply topically 2 (two) times daily. (Patient not taking: Reported on 10/15/2016)  Dispense: 25 g; Refill: 2  5. Anemia affecting pregnancy in third  trimester  - ferrous sulfate (FERROUSUL) 325 (65 FE) MG tablet; Take 1 tablet (325 mg total) by mouth daily.  Dispense: 60 tablet; Refill: 1 - docusate sodium (COLACE) 100 MG capsule; Take 1 capsule (100 mg total) by mouth 2 (two) times daily as needed. (Patient not taking: Reported on 10/15/2016)  Dispense: 30 capsule; Refill: 2 - polyethylene glycol powder (GLYCOLAX/MIRALAX) powder; Take 17 g by mouth daily. (Patient not taking: Reported on 10/15/2016)  Dispense: 255 g; Refill: 0*  Preterm labor symptoms and general obstetric precautions including but not limited to vaginal bleeding, contractions, leaking of fluid and fetal movement were reviewed in detail with the patient. Please refer to After Visit Summary for other counseling recommendations.  Return in about 2 days (around 10/15/2016) for 11/16 or 11/17 for NST; 11/20  NST only.   Hurshel PartyLisa A Leftwich-Kirby, CNM

## 2016-10-19 ENCOUNTER — Ambulatory Visit (INDEPENDENT_AMBULATORY_CARE_PROVIDER_SITE_OTHER): Payer: Medicaid Other | Admitting: Obstetrics & Gynecology

## 2016-10-19 ENCOUNTER — Other Ambulatory Visit: Payer: Medicaid Other

## 2016-10-19 VITALS — BP 123/78 | HR 97

## 2016-10-19 DIAGNOSIS — O10913 Unspecified pre-existing hypertension complicating pregnancy, third trimester: Secondary | ICD-10-CM | POA: Diagnosis present

## 2016-10-19 DIAGNOSIS — O10919 Unspecified pre-existing hypertension complicating pregnancy, unspecified trimester: Secondary | ICD-10-CM

## 2016-10-19 NOTE — Progress Notes (Signed)
Video interpreter [name] # [id number] used for encounter.  Pt had BPP on 11/16 - 8/8.  NST reactive Carolyn Ramsey H.

## 2016-10-20 ENCOUNTER — Other Ambulatory Visit: Payer: Medicaid Other

## 2016-10-21 ENCOUNTER — Ambulatory Visit (HOSPITAL_COMMUNITY)
Admission: RE | Admit: 2016-10-21 | Discharge: 2016-10-21 | Disposition: A | Discharge status: Home / Self Care | Payer: Medicaid Other | Source: Ambulatory Visit | Attending: Obstetrics and Gynecology | Admitting: Obstetrics and Gynecology

## 2016-10-23 ENCOUNTER — Encounter (HOSPITAL_COMMUNITY): Payer: Self-pay

## 2016-10-23 ENCOUNTER — Other Ambulatory Visit (HOSPITAL_COMMUNITY): Payer: Self-pay | Admitting: Obstetrics and Gynecology

## 2016-10-23 ENCOUNTER — Ambulatory Visit (HOSPITAL_COMMUNITY)
Admission: RE | Admit: 2016-10-23 | Discharge: 2016-10-23 | Disposition: A | Discharge status: Home / Self Care | Payer: Medicaid Other | Source: Ambulatory Visit | Attending: Obstetrics and Gynecology | Admitting: Obstetrics and Gynecology

## 2016-10-23 DIAGNOSIS — Z3A33 33 weeks gestation of pregnancy: Secondary | ICD-10-CM | POA: Insufficient documentation

## 2016-10-23 DIAGNOSIS — O10913 Unspecified pre-existing hypertension complicating pregnancy, third trimester: Secondary | ICD-10-CM | POA: Diagnosis present

## 2016-10-23 DIAGNOSIS — O10919 Unspecified pre-existing hypertension complicating pregnancy, unspecified trimester: Secondary | ICD-10-CM

## 2016-10-23 DIAGNOSIS — O99213 Obesity complicating pregnancy, third trimester: Secondary | ICD-10-CM | POA: Diagnosis not present

## 2016-10-23 DIAGNOSIS — O09523 Supervision of elderly multigravida, third trimester: Secondary | ICD-10-CM | POA: Insufficient documentation

## 2016-10-23 DIAGNOSIS — O10013 Pre-existing essential hypertension complicating pregnancy, third trimester: Secondary | ICD-10-CM | POA: Insufficient documentation

## 2016-10-26 ENCOUNTER — Other Ambulatory Visit (HOSPITAL_COMMUNITY): Payer: Self-pay | Admitting: *Deleted

## 2016-10-26 DIAGNOSIS — O10919 Unspecified pre-existing hypertension complicating pregnancy, unspecified trimester: Secondary | ICD-10-CM

## 2016-10-29 ENCOUNTER — Ambulatory Visit: Payer: Self-pay

## 2016-10-29 ENCOUNTER — Ambulatory Visit (INDEPENDENT_AMBULATORY_CARE_PROVIDER_SITE_OTHER): Payer: Medicaid Other | Admitting: Family

## 2016-10-29 VITALS — BP 123/81 | HR 114 | Wt 242.5 lb

## 2016-10-29 DIAGNOSIS — Z3689 Encounter for other specified antenatal screening: Secondary | ICD-10-CM | POA: Diagnosis not present

## 2016-10-29 DIAGNOSIS — O10919 Unspecified pre-existing hypertension complicating pregnancy, unspecified trimester: Secondary | ICD-10-CM

## 2016-10-29 DIAGNOSIS — O10913 Unspecified pre-existing hypertension complicating pregnancy, third trimester: Secondary | ICD-10-CM

## 2016-10-29 DIAGNOSIS — Z789 Other specified health status: Secondary | ICD-10-CM

## 2016-10-29 DIAGNOSIS — O0990 Supervision of high risk pregnancy, unspecified, unspecified trimester: Secondary | ICD-10-CM

## 2016-10-29 DIAGNOSIS — O0993 Supervision of high risk pregnancy, unspecified, third trimester: Secondary | ICD-10-CM

## 2016-10-29 LAB — POCT URINALYSIS DIP (DEVICE)
BILIRUBIN URINE: NEGATIVE
GLUCOSE, UA: NEGATIVE mg/dL
Hgb urine dipstick: NEGATIVE
KETONES UR: NEGATIVE mg/dL
Nitrite: NEGATIVE
PROTEIN: 30 mg/dL — AB
Urobilinogen, UA: 0.2 mg/dL (ref 0.0–1.0)
pH: 6 (ref 5.0–8.0)

## 2016-10-29 NOTE — Progress Notes (Signed)
   PRENATAL VISIT NOTE  Subjective:  Carolyn Ramsey is a 40 y.o. G5P4004 at 4140w2d being seen today for ongoing prenatal care.  She is currently monitored for the following issues for this high-risk pregnancy and has Female circumcision; Chronic hypertension in pregnancy; Essential hypertension; Supervision of high risk pregnancy, antepartum; and Language barrier on her problem list.  Patient reports no complaints.  Contractions: Not present. Vag. Bleeding: None.  Movement: Present. Denies leaking of fluid.   The following portions of the patient's history were reviewed and updated as appropriate: allergies, current medications, past family history, past medical history, past social history, past surgical history and problem list. Problem list updated.  Objective:   Vitals:   10/29/16 0850  BP: 123/81  Pulse: (!) 114  Weight: 242 lb 8 oz (110 kg)    Fetal Status: Fetal Heart Rate (bpm): NST   Movement: Present  Presentation: Vertex  General:  Alert, oriented and cooperative. Patient is in no acute distress.  Skin: Skin is warm and dry. No rash noted.   Cardiovascular: Normal heart rate noted  Respiratory: Normal respiratory effort, no problems with respiration noted  Abdomen: Soft, gravid, appropriate for gestational age. Pain/Pressure: Absent     Pelvic:  Cervical exam deferred        Extremities: Normal range of motion.  Edema: None  Mental Status: Normal mood and affect. Normal behavior. Normal judgment and thought content.   Assessment and Plan:  Pregnancy: G5P4004 at 2740w2d  1. Supervision of high risk pregnancy in third trimester - Continue close monitoring - Return for correctly done 2 hr  2. Chronic hypertension in pregnancy - Continue Labetalol - Fetal nonstress test - Reactive - US OB Limited  3. Language barrier - Video interpreter utilized [name]  Preterm labor symptoms and general obstetric precautions including but not limited to vaginal bleeding,  contractions, leaking of fluid and fetal movement were reviewed in detail with the patient. Please refer to After Visit Summary for other counseling recommendations.  Return in about 4 days (around 11/02/2016) for NST;  12/7 or 12/8  Ob fu and NST/AFI.   Eino FarberWalidah Kennith GainN Karim, CNM

## 2016-10-29 NOTE — Progress Notes (Signed)
Video interpreter [name] # [id number] used for encounter today.

## 2016-11-02 ENCOUNTER — Other Ambulatory Visit: Payer: Medicaid Other | Admitting: Obstetrics & Gynecology

## 2016-11-05 ENCOUNTER — Ambulatory Visit (HOSPITAL_COMMUNITY): Payer: Medicaid Other

## 2016-11-05 ENCOUNTER — Ambulatory Visit (INDEPENDENT_AMBULATORY_CARE_PROVIDER_SITE_OTHER): Payer: Medicaid Other | Admitting: Obstetrics & Gynecology

## 2016-11-05 VITALS — BP 133/83 | HR 103 | Wt 245.0 lb

## 2016-11-05 DIAGNOSIS — O10013 Pre-existing essential hypertension complicating pregnancy, third trimester: Secondary | ICD-10-CM

## 2016-11-05 DIAGNOSIS — I10 Essential (primary) hypertension: Secondary | ICD-10-CM

## 2016-11-05 DIAGNOSIS — O0990 Supervision of high risk pregnancy, unspecified, unspecified trimester: Secondary | ICD-10-CM

## 2016-11-05 MED ORDER — PRENATAL VITAMIN 27-0.8 MG PO TABS: 1.0000 | ORAL_TABLET | Freq: Every day | ORAL | 3 refills | Status: DC

## 2016-11-05 MED ORDER — LABETALOL HCL 100 MG PO TABS: 100.0000 mg | ORAL_TABLET | Freq: Two times a day (BID) | ORAL | 2 refills | Status: DC

## 2016-11-05 NOTE — Progress Notes (Signed)
   PRENATAL VISIT NOTE  Subjective:  Carolyn Ramsey is a 40 y.o. G5P4004 at 8127w2d being seen today for ongoing prenatal care.  She is currently monitored for the following issues for this high-risk pregnancy and has Female circumcision; Chronic hypertension in pregnancy; Essential hypertension; Supervision of high risk pregnancy, antepartum; and Language barrier on her problem list.  Arabic interpreter used.   Patient reports no complaints.  Contractions: Not present. Vag. Bleeding: None.  Movement: Present. Denies leaking of fluid.   The following portions of the patient's history were reviewed and updated as appropriate: allergies, current medications, past family history, past medical history, past social history, past surgical history and problem list. Problem list updated.  Objective:   Vitals:   11/05/16 1002  BP: 133/83  Pulse: (!) 103  Weight: 245 lb (111.1 kg)    Fetal Status: Fetal Heart Rate (bpm): RNST Fundal Height: 36 cm Movement: Present  Presentation: Vertex  General:  Alert, oriented and cooperative. Patient is in no acute distress.  Skin: Skin is warm and dry. No rash noted.   Cardiovascular: Normal heart rate noted  Respiratory: Normal respiratory effort, no problems with respiration noted  Abdomen: Soft, gravid, appropriate for gestational age. Pain/Pressure: Absent     Pelvic:  Cervical exam deferred        Extremities: Normal range of motion.  Edema: None  Mental Status: Normal mood and affect. Normal behavior. Normal judgment and thought content.   NST performed today was reviewed and was found to be reactive.  AFI was also normal.    Assessment and Plan:  Pregnancy: G5P4004 at 1927w2d  1. Pre-existing essential hypertension during pregnancy in third trimester Stable BP on Labetalol.  Growth scan on 12/22. Continue recommended antenatal testing and prenatal care. - Fetal nonstress test - labetalol (NORMODYNE) 100 MG tablet; Take 1 tablet (100 mg total) by  mouth 2 (two) times daily.  Dispense: 60 tablet; Refill: 2  2. Supervision of high risk pregnancy, antepartum - Prenatal Vit-Fe Fumarate-FA (PRENATAL VITAMIN) 27-0.8 MG TABS; Take 1 tablet by mouth daily.  Dispense: 30 tablet; Refill: 3  Preterm labor symptoms and general obstetric precautions including but not limited to vaginal bleeding, contractions, leaking of fluid and fetal movement were reviewed in detail with the patient. Please refer to After Visit Summary for other counseling recommendations.  Return in about 4 days (around 11/09/2016) for NST  12/14 OB visit, Pelvic cultures, NST, AFI  for HTN.   Tereso NewcomerUgonna A Jamaar Howes, MD

## 2016-11-10 ENCOUNTER — Ambulatory Visit (INDEPENDENT_AMBULATORY_CARE_PROVIDER_SITE_OTHER): Payer: Medicaid Other | Admitting: Family

## 2016-11-10 VITALS — BP 123/86 | HR 104

## 2016-11-10 DIAGNOSIS — O10013 Pre-existing essential hypertension complicating pregnancy, third trimester: Secondary | ICD-10-CM

## 2016-11-10 NOTE — Progress Notes (Signed)
Video interpreter [name] [phone]#[id number] used for encounter today.

## 2016-11-12 NOTE — Progress Notes (Signed)
NST reviewed and reactive; continue scheduled antenatal testing. 

## 2016-11-13 ENCOUNTER — Other Ambulatory Visit (HOSPITAL_COMMUNITY)
Admission: RE | Admit: 2016-11-13 | Discharge: 2016-11-13 | Disposition: A | Discharge status: Home / Self Care | Payer: Medicaid Other | Source: Ambulatory Visit | Attending: Obstetrics and Gynecology | Admitting: Obstetrics and Gynecology

## 2016-11-13 ENCOUNTER — Ambulatory Visit (INDEPENDENT_AMBULATORY_CARE_PROVIDER_SITE_OTHER): Payer: Medicaid Other | Admitting: Obstetrics and Gynecology

## 2016-11-13 ENCOUNTER — Ambulatory Visit: Payer: Self-pay

## 2016-11-13 VITALS — BP 134/83 | HR 106 | Wt 246.0 lb

## 2016-11-13 DIAGNOSIS — I10 Essential (primary) hypertension: Secondary | ICD-10-CM

## 2016-11-13 DIAGNOSIS — O10913 Unspecified pre-existing hypertension complicating pregnancy, third trimester: Secondary | ICD-10-CM | POA: Diagnosis not present

## 2016-11-13 DIAGNOSIS — Z113 Encounter for screening for infections with a predominantly sexual mode of transmission: Secondary | ICD-10-CM | POA: Insufficient documentation

## 2016-11-13 DIAGNOSIS — O0990 Supervision of high risk pregnancy, unspecified, unspecified trimester: Secondary | ICD-10-CM

## 2016-11-13 DIAGNOSIS — O10919 Unspecified pre-existing hypertension complicating pregnancy, unspecified trimester: Secondary | ICD-10-CM

## 2016-11-13 LAB — POCT URINALYSIS DIP (DEVICE)
BILIRUBIN URINE: NEGATIVE
GLUCOSE, UA: NEGATIVE mg/dL
Hgb urine dipstick: NEGATIVE
KETONES UR: NEGATIVE mg/dL
NITRITE: NEGATIVE
Protein, ur: NEGATIVE mg/dL
Specific Gravity, Urine: 1.025 (ref 1.005–1.030)
Urobilinogen, UA: 0.2 mg/dL (ref 0.0–1.0)
pH: 6.5 (ref 5.0–8.0)

## 2016-11-13 LAB — OB RESULTS CONSOLE GC/CHLAMYDIA: Gonorrhea: NEGATIVE

## 2016-11-13 LAB — OB RESULTS CONSOLE GBS: STREP GROUP B AG: NEGATIVE

## 2016-11-13 MED ORDER — NYSTATIN-TRIAMCINOLONE 100000-0.1 UNIT/GM-% EX OINT: 1.0000 "application " | TOPICAL_OINTMENT | Freq: Two times a day (BID) | CUTANEOUS | 0 refills | Status: DC

## 2016-11-13 NOTE — Progress Notes (Signed)
   PRENATAL VISIT NOTE  Subjective:  Carolyn Ramsey is a 40 y.o. G5P4004 at 5851w3d being seen today for ongoing prenatal care.  She is currently monitored for the following issues for this high-risk pregnancy and has Female circumcision; Chronic hypertension in pregnancy; Essential hypertension; Supervision of high risk pregnancy, antepartum; and Language barrier on her problem list.  Patient reports no complaints.  Contractions: Not present. Vag. Bleeding: None.  Movement: Present. Denies leaking of fluid.   The following portions of the patient's history were reviewed and updated as appropriate: allergies, current medications, past family history, past medical history, past social history, past surgical history and problem list. Problem list updated.  Objective:   Vitals:   11/13/16 0940  BP: 134/83  Pulse: (!) 106  Weight: 246 lb (111.6 kg)    Fetal Status: Fetal Heart Rate (bpm): NST Fundal Height: 36 cm Movement: Present  Presentation: Vertex  General:  Alert, oriented and cooperative. Patient is in no acute distress.  Skin: Skin is warm and dry. No rash noted.   Cardiovascular: Normal heart rate noted  Respiratory: Normal respiratory effort, no problems with respiration noted  Abdomen: Soft, gravid, appropriate for gestational age. Pain/Pressure: Present     Pelvic:  Cervical exam performed Dilation: 1 Effacement (%): 50 Station: Ballotable  Extremities: Normal range of motion.  Edema: None  Mental Status: Normal mood and affect. Normal behavior. Normal judgment and thought content.   Assessment and Plan:  Pregnancy: G5P4004 at 6151w3d  1. Supervision of high risk pregnancy, antepartum Patient is doing well without complaints Cultures today - Culture, beta strep (group b only) - GC/Chlamydia probe amp ([place])not at [facility]  2. Chronic hypertension in pregnancy BP well controlled Continue labetalol Follow up growth on 12/20 - Fetal nonstress test - US MFM FETAL BPP  WO NON STRESS; Future  3. Essential hypertension   Preterm labor symptoms and general obstetric precautions including but not limited to vaginal bleeding, contractions, leaking of fluid and fetal movement were reviewed in detail with the patient. Please refer to After Visit Summary for other counseling recommendations.  No Follow-up on file.   Catalina AntiguaPeggy Oluwatosin Bracy, MD

## 2016-11-13 NOTE — Progress Notes (Signed)
US for growth scheduled 12/22 - BPP added  Video interpreter Claudine #[id number] used for encounter today.

## 2016-11-15 LAB — CULTURE, BETA STREP (GROUP B ONLY)

## 2016-11-16 LAB — GC/CHLAMYDIA PROBE AMP (~~LOC~~) NOT AT ARMC
CHLAMYDIA, DNA PROBE: NEGATIVE
NEISSERIA GONORRHEA: NEGATIVE

## 2016-11-17 ENCOUNTER — Other Ambulatory Visit: Payer: Medicaid Other | Admitting: Obstetrics and Gynecology

## 2016-11-20 ENCOUNTER — Encounter: Payer: Self-pay | Admitting: Obstetrics & Gynecology

## 2016-11-20 ENCOUNTER — Ambulatory Visit (INDEPENDENT_AMBULATORY_CARE_PROVIDER_SITE_OTHER): Payer: Medicaid Other | Admitting: Obstetrics & Gynecology

## 2016-11-20 ENCOUNTER — Encounter (HOSPITAL_COMMUNITY): Payer: Self-pay

## 2016-11-20 ENCOUNTER — Ambulatory Visit (HOSPITAL_COMMUNITY)
Admission: RE | Admit: 2016-11-20 | Discharge: 2016-11-20 | Disposition: A | Discharge status: Home / Self Care | Payer: Medicaid Other | Source: Ambulatory Visit | Attending: Obstetrics and Gynecology | Admitting: Obstetrics and Gynecology

## 2016-11-20 ENCOUNTER — Other Ambulatory Visit: Payer: Self-pay | Admitting: Obstetrics and Gynecology

## 2016-11-20 VITALS — BP 127/75 | HR 106 | Wt 247.8 lb

## 2016-11-20 DIAGNOSIS — O99213 Obesity complicating pregnancy, third trimester: Secondary | ICD-10-CM | POA: Insufficient documentation

## 2016-11-20 DIAGNOSIS — Z3A37 37 weeks gestation of pregnancy: Secondary | ICD-10-CM

## 2016-11-20 DIAGNOSIS — E669 Obesity, unspecified: Secondary | ICD-10-CM | POA: Diagnosis not present

## 2016-11-20 DIAGNOSIS — O0990 Supervision of high risk pregnancy, unspecified, unspecified trimester: Secondary | ICD-10-CM

## 2016-11-20 DIAGNOSIS — O10913 Unspecified pre-existing hypertension complicating pregnancy, third trimester: Secondary | ICD-10-CM | POA: Insufficient documentation

## 2016-11-20 DIAGNOSIS — Z362 Encounter for other antenatal screening follow-up: Secondary | ICD-10-CM | POA: Diagnosis present

## 2016-11-20 DIAGNOSIS — Z6841 Body Mass Index (BMI) 40.0 and over, adult: Secondary | ICD-10-CM | POA: Insufficient documentation

## 2016-11-20 DIAGNOSIS — O09523 Supervision of elderly multigravida, third trimester: Secondary | ICD-10-CM | POA: Insufficient documentation

## 2016-11-20 DIAGNOSIS — O10919 Unspecified pre-existing hypertension complicating pregnancy, unspecified trimester: Secondary | ICD-10-CM

## 2016-11-20 DIAGNOSIS — O10013 Pre-existing essential hypertension complicating pregnancy, third trimester: Secondary | ICD-10-CM | POA: Diagnosis not present

## 2016-11-20 NOTE — Patient Instructions (Signed)
Return to clinic for any scheduled appointments or obstetric concerns, or go to [facility] for evaluation  

## 2016-11-20 NOTE — Progress Notes (Signed)
   PRENATAL VISIT NOTE  Subjective:  Carolyn Ramsey is a 40 y.o. G5P4004 at 7327w3d being seen today for ongoing prenatal care.  She is currently monitored for the following issues for this high-risk pregnancy and has Female circumcision; Chronic hypertension in pregnancy; Essential hypertension; Supervision of high risk pregnancy, antepartum; and Language barrier on her problem list.  Arabic interpreter used during this encounter.  Patient reports no complaints.  Contractions: Not present. Vag. Bleeding: None.  Movement: Present. Denies leaking of fluid.   The following portions of the patient's history were reviewed and updated as appropriate: allergies, current medications, past family history, past medical history, past social history, past surgical history and problem list. Problem list updated.  Objective:   Vitals:   11/20/16 0824  BP: 127/75  Pulse: (!) 106  Weight: 247 lb 12.8 oz (112.4 kg)    Fetal Status: Fetal Heart Rate (bpm): NST Fundal Height: 37 cm Movement: Present     General:  Alert, oriented and cooperative. Patient is in no acute distress.  Skin: Skin is warm and dry. No rash noted.   Cardiovascular: Normal heart rate noted  Respiratory: Normal respiratory effort, no problems with respiration noted  Abdomen: Soft, gravid, appropriate for gestational age. Pain/Pressure: Present     Pelvic:  Cervical exam deferred        Extremities: Normal range of motion.  Edema: Trace  Mental Status: Normal mood and affect. Normal behavior. Normal judgment and thought content.   NST performed today was reviewed and was found to be reactive.  Continue recommended antenatal testing and prenatal care.  Assessment and Plan:  Pregnancy: G5P4004 at 2427w3d  1. Pre-existing essential hypertension during pregnancy in third trimester - Fetal nonstress test.  Growth and BPP today. Stable BP on medications.  IOL to be scheduled at 39 weeks. Patient is very resistant to undergo induction  of labor. Counseled about increased risk of stillbirth or other morbidities after 39 weeks in the setting of CHTN on medications. Will proceed with scheduling IOL at 39 weeks; if she does not want to go ahead with it, she can sign AMA form.  Will continue twice/week testing for now.  2. Supervision of high risk pregnancy, antepartum Term labor symptoms and general obstetric precautions including but not limited to vaginal bleeding, contractions, leaking of fluid and fetal movement were reviewed in detail with the patient. Please refer to After Visit Summary for other counseling recommendations.  Return in about 7 days (around 11/27/2016) for Ob fu and NST/AFI.   Tereso NewcomerUgonna A Anyanwu, MD

## 2016-11-20 NOTE — Addendum Note (Signed)
Encounter addended by: Vivien Rotaachael H Micahel Omlor, RT on: 11/20/2016 10:39 AM<BR>    Actions taken: Imaging Exam ended

## 2016-11-20 NOTE — Progress Notes (Signed)
Interpreter Feryal present for encounter.  US for growth and BPP today

## 2016-11-24 ENCOUNTER — Other Ambulatory Visit: Payer: Self-pay | Admitting: Obstetrics and Gynecology

## 2016-11-24 MED ORDER — NYSTATIN 100000 UNIT/GM EX OINT: 1.0000 "application " | TOPICAL_OINTMENT | Freq: Two times a day (BID) | CUTANEOUS | 1 refills | Status: DC

## 2016-11-25 ENCOUNTER — Encounter (HOSPITAL_COMMUNITY): Payer: Self-pay | Admitting: *Deleted

## 2016-11-25 ENCOUNTER — Telehealth (HOSPITAL_COMMUNITY): Payer: Self-pay | Admitting: *Deleted

## 2016-11-25 NOTE — Telephone Encounter (Signed)
Preadmission screen Interpreter number [phone]

## 2016-11-26 ENCOUNTER — Ambulatory Visit (INDEPENDENT_AMBULATORY_CARE_PROVIDER_SITE_OTHER): Payer: Medicaid Other | Admitting: Medical

## 2016-11-26 ENCOUNTER — Ambulatory Visit: Payer: Self-pay

## 2016-11-26 ENCOUNTER — Encounter: Payer: Self-pay | Admitting: *Deleted

## 2016-11-26 VITALS — BP 137/88 | HR 108 | Wt 246.2 lb

## 2016-11-26 DIAGNOSIS — Z3689 Encounter for other specified antenatal screening: Secondary | ICD-10-CM

## 2016-11-26 DIAGNOSIS — O10013 Pre-existing essential hypertension complicating pregnancy, third trimester: Secondary | ICD-10-CM

## 2016-11-26 DIAGNOSIS — O0990 Supervision of high risk pregnancy, unspecified, unspecified trimester: Secondary | ICD-10-CM

## 2016-11-26 LAB — POCT URINALYSIS DIP (DEVICE)
BILIRUBIN URINE: NEGATIVE
Glucose, UA: NEGATIVE mg/dL
HGB URINE DIPSTICK: NEGATIVE
Ketones, ur: NEGATIVE mg/dL
Nitrite: NEGATIVE
PH: 7 (ref 5.0–8.0)
Protein, ur: NEGATIVE mg/dL
SPECIFIC GRAVITY, URINE: 1.015 (ref 1.005–1.030)
Urobilinogen, UA: 0.2 mg/dL (ref 0.0–1.0)

## 2016-11-26 NOTE — Progress Notes (Addendum)
[employer] video interpreter Hasam # [id number] used for encounter. Pt informed that alternate ointment (Nystatin) has been sent to her pharmacy. IOL scheduled 1/2 @ 0730.  Pt informed that the ultrasound today is considered a limited OB ultrasound and is not intended to be a complete ultrasound exam.  Patient also informed that the ultrasound is not being completed with the intent of assessing for fetal or placental anomalies or any pelvic abnormalities.  Explained that the purpose of today's ultrasound is to assess for presentation and amniotic fluid volume.  Patient acknowledges the purpose of the exam and the limitations of the study.

## 2016-11-26 NOTE — Patient Instructions (Signed)
Introduction Patient Name: ________________________________________________ Patient Due Date: ____________________ What is a fetal movement count? A fetal movement count is the number of times that you feel your baby move during a certain amount of time. This may also be called a fetal kick count. A fetal movement count is recommended for every pregnant woman. You may be asked to start counting fetal movements as early as week 28 of your pregnancy. Pay attention to when your baby is most active. You may notice your baby's sleep and wake cycles. You may also notice things that make your baby move more. You should do a fetal movement count:  When your baby is normally most active.  At the same time each day. A good time to count movements is while you are resting, after having something to eat and drink. How do I count fetal movements? 1. Find a quiet, comfortable area. Sit, or lie down on your side. 2. Write down the date, the start time and stop time, and the number of movements that you felt between those two times. Take this information with you to your health care visits. 3. For 2 hours, count kicks, flutters, swishes, rolls, and jabs. You should feel at least 10 movements during 2 hours. 4. You may stop counting after you have felt 10 movements. 5. If you do not feel 10 movements in 2 hours, have something to eat and drink. Then, keep resting and counting for 1 hour. If you feel at least 4 movements during that hour, you may stop counting. Contact a health care provider if:  You feel fewer than 4 movements in 2 hours.  Your baby is not moving like he or she usually does. Date: ____________ Start time: ____________ Stop time: ____________ Movements: ____________ Date: ____________ Start time: ____________ Stop time: ____________ Movements: ____________ Date: ____________ Start time: ____________ Stop time: ____________ Movements: ____________ Date: ____________ Start time: ____________  Stop time: ____________ Movements: ____________ Date: ____________ Start time: ____________ Stop time: ____________ Movements: ____________ Date: ____________ Start time: ____________ Stop time: ____________ Movements: ____________ Date: ____________ Start time: ____________ Stop time: ____________ Movements: ____________ Date: ____________ Start time: ____________ Stop time: ____________ Movements: ____________ Date: ____________ Start time: ____________ Stop time: ____________ Movements: ____________ This information is not intended to replace advice given to you by your health care provider. Make sure you discuss any questions you have with your health care provider. Document Released: 12/16/2006 Document Revised: 07/15/2016 Document Reviewed: 12/26/2015 Elsevier Interactive Patient Education  2017 Else[employer] Braxton Hicks Contractions Contractions of the uterus can occur throughout pregnancy. Contractions are not always a sign that you are in labor.  WHAT ARE BRAXTON HICKS CONTRACTIONS?  Contractions that occur before labor are called Braxton Hicks contractions, or false labor. Toward the end of pregnancy (32-34 weeks), these contractions can develop more often and may become more forceful. This is not true labor because these contractions do not result in opening (dilatation) and thinning of the cervix. They are sometimes difficult to tell apart from true labor because these contractions can be forceful and people have different pain tolerances. You should not feel embarrassed if you go to the hospital with false labor. Sometimes, the only way to tell if you are in true labor is for your health care provider to look for changes in the cervix. If there are no prenatal problems or other health problems associated with the pregnancy, it is completely safe to be sent home with false labor and await the onset of true labor.   HOW CAN YOU TELL THE DIFFERENCE BETWEEN TRUE AND FALSE LABOR? False Labor     The contractions of false labor are usually shorter and not as hard as those of true labor.   The contractions are usually irregular.   The contractions are often felt in the front of the lower abdomen and in the groin.   The contractions may go away when you walk around or change positions while lying down.   The contractions get weaker and are shorter lasting as time goes on.   The contractions do not usually become progressively stronger, regular, and closer together as with true labor.  True Labor   Contractions in true labor last 30-70 seconds, become very regular, usually become more intense, and increase in frequency.   The contractions do not go away with walking.   The discomfort is usually felt in the top of the uterus and spreads to the lower abdomen and low back.   True labor can be determined by your health care provider with an exam. This will show that the cervix is dilating and getting thinner.  WHAT TO REMEMBER  Keep up with your usual exercises and follow other instructions given by your health care provider.   Take medicines as directed by your health care provider.   Keep your regular prenatal appointments.   Eat and drink lightly if you think you are going into labor.   If Braxton Hicks contractions are making you uncomfortable:   Change your position from lying down or resting to walking, or from walking to resting.   Sit and rest in a tub of warm water.   Drink 2-3 glasses of water. Dehydration may cause these contractions.   Do slow and deep breathing several times an hour.  WHEN SHOULD I SEEK IMMEDIATE MEDICAL CARE? Seek immediate medical care if:  Your contractions become stronger, more regular, and closer together.   You have fluid leaking or gushing from your vagina.   You have a fever.   You pass blood-tinged mucus.   You have vaginal bleeding.   You have continuous abdominal pain.   You have low back pain  that you never had before.   You feel your baby's head pushing down and causing pelvic pressure.   Your baby is not moving as much as it used to.  This information is not intended to replace advice given to you by your health care provider. Make sure you discuss any questions you have with your health care provider. Document Released: 11/16/2005 Document Revised: 03/09/2016 Document Reviewed: 08/28/2013 Elsevier Interactive Patient Education  2017 Elsevier Inc.  

## 2016-11-26 NOTE — Progress Notes (Signed)
   PRENATAL VISIT NOTE  Subjective:  Carolyn Ramsey is a 40 y.o. G5P4004 at 5961w2d being seen today for ongoing prenatal care.  She is currently monitored for the following issues for this high-risk pregnancy and has Female circumcision; Chronic hypertension in pregnancy; Essential hypertension; Supervision of high risk pregnancy, antepartum; and Language barrier on her problem list.  Patient reports no complaints.  Contractions: Not present. Vag. Bleeding: None.  Movement: Present. Denies leaking of fluid.   The following portions of the patient's history were reviewed and updated as appropriate: allergies, current medications, past family history, past medical history, past social history, past surgical history and problem list. Problem list updated.  Objective:   Vitals:   11/26/16 0957  BP: 137/88  Pulse: (!) 108  Weight: 246 lb 3.2 oz (111.7 kg)    Fetal Status: Fetal Heart Rate (bpm): NST Fundal Height: 38 cm Movement: Present  Presentation: Vertex  General:  Alert, oriented and cooperative. Patient is in no acute distress.  Skin: Skin is warm and dry. No rash noted.   Cardiovascular: Normal heart rate noted  Respiratory: Normal respiratory effort, no problems with respiration noted  Abdomen: Soft, gravid, appropriate for gestational age. Pain/Pressure: Present     Pelvic:  Cervical exam deferred        Extremities: Normal range of motion.     Mental Status: Normal mood and affect. Normal behavior. Normal judgment and thought content.   Assessment and Plan:  Pregnancy: G5P4004 at 6561w2d  1. Pre-existing essential hypertension during pregnancy in third trimester - Fetal nonstress test, reactive - US OB Limited  2. Supervision of high risk pregnancy, antepartum - Scheduled for induction 1/2 - Patient had questions about epidural, discussed benefits and risks with patient, also advised that anesthesia will discuss further prior to administration - Patient again requests  in-person interpreter. Advised that this could not be provided, but that a female interpreter can be requested through [employer] - Patient requests a letter for her husband's work so that he can be present for the induction, letter provided  Term labor symptoms and general obstetric precautions including but not limited to vaginal bleeding, contractions, leaking of fluid and fetal movement were reviewed in detail with the patient. Please refer to After Visit Summary for other counseling recommendations.  Return in about 6 weeks (around 01/07/2017) for PP visit.  IOL on 1/2.   Marny LowensteinJulie N Stephanieann Popescu, PA-C

## 2016-11-30 NOTE — L&D Delivery Note (Signed)
Delivery Note Patient admitted for IOL for chronic HTN. She was given Cytotec.   At 10:18 PM a viable female was delivered via Vaginal, Spontaneous Delivery (Presentation: ROT ).  APGAR: 8, 9; weight 7 lb 1.8 oz (3226 g).   Placenta status: spontaneous, intact    Cord: 3 vessels with the following complications: none Cord pH: pending  Anesthesia:   Episiotomy: None Lacerations: 2nd degree Suture Repair: 2.0 vicryl Est. Blood Loss (mL): 100  Mom to postpartum.  Baby to Couplet care / Skin to Skin.  Beaulah DinningChristina M Jeannetta Cerutti 12/01/2016, 11:42 PM

## 2016-12-01 ENCOUNTER — Encounter (HOSPITAL_COMMUNITY): Payer: Self-pay

## 2016-12-01 ENCOUNTER — Inpatient Hospital Stay (HOSPITAL_COMMUNITY)
Admission: RE | Admit: 2016-12-01 | Discharge: 2016-12-03 | DRG: 774 | Disposition: A | Discharge status: Home / Self Care | Payer: Medicaid Other | Source: Ambulatory Visit | Attending: Family Medicine | Admitting: Family Medicine

## 2016-12-01 ENCOUNTER — Inpatient Hospital Stay (HOSPITAL_COMMUNITY): Admission: RE | Admit: 2016-12-01 | Payer: Medicaid Other | Source: Ambulatory Visit

## 2016-12-01 ENCOUNTER — Inpatient Hospital Stay (HOSPITAL_COMMUNITY): Payer: Medicaid Other | Admitting: Anesthesiology

## 2016-12-01 DIAGNOSIS — O99214 Obesity complicating childbirth: Secondary | ICD-10-CM | POA: Diagnosis present

## 2016-12-01 DIAGNOSIS — Z6839 Body mass index (BMI) 39.0-39.9, adult: Secondary | ICD-10-CM

## 2016-12-01 DIAGNOSIS — I10 Essential (primary) hypertension: Secondary | ICD-10-CM | POA: Diagnosis present

## 2016-12-01 DIAGNOSIS — O1002 Pre-existing essential hypertension complicating childbirth: Secondary | ICD-10-CM | POA: Diagnosis present

## 2016-12-01 DIAGNOSIS — O1092 Unspecified pre-existing hypertension complicating childbirth: Secondary | ICD-10-CM | POA: Diagnosis not present

## 2016-12-01 DIAGNOSIS — Z3A39 39 weeks gestation of pregnancy: Secondary | ICD-10-CM | POA: Diagnosis not present

## 2016-12-01 LAB — CBC
HCT: 31.9 % — ABNORMAL LOW (ref 36.0–46.0)
HCT: 31.5 % — ABNORMAL LOW (ref 36.0–46.0)
Hemoglobin: 10.8 g/dL — ABNORMAL LOW (ref 12.0–15.0)
Hemoglobin: 10.6 g/dL — ABNORMAL LOW (ref 12.0–15.0)
MCH: 27.3 pg (ref 26.0–34.0)
MCH: 27.3 pg (ref 26.0–34.0)
MCHC: 33.9 g/dL (ref 30.0–36.0)
MCHC: 33.7 g/dL (ref 30.0–36.0)
MCV: 80.6 fL (ref 78.0–100.0)
MCV: 81.2 fL (ref 78.0–100.0)
PLATELETS: 229 10*3/uL (ref 150–400)
PLATELETS: 203 10*3/uL (ref 150–400)
RBC: 3.96 MIL/uL (ref 3.87–5.11)
RBC: 3.88 MIL/uL (ref 3.87–5.11)
RDW: 18 % — AB (ref 11.5–15.5)
RDW: 17.9 % — AB (ref 11.5–15.5)
WBC: 7.2 10*3/uL (ref 4.0–10.5)
WBC: 6.8 10*3/uL (ref 4.0–10.5)

## 2016-12-01 LAB — TYPE AND SCREEN
ABO/RH(D): O POS
ANTIBODY SCREEN: NEGATIVE

## 2016-12-01 LAB — RPR: RPR Ser Ql: NONREACTIVE

## 2016-12-01 MED ORDER — OXYCODONE-ACETAMINOPHEN 5-325 MG PO TABS: 1.0000 | ORAL_TABLET | ORAL | Status: DC | PRN

## 2016-12-01 MED ORDER — FENTANYL 2.5 MCG/ML BUPIVACAINE 1/10 % EPIDURAL INFUSION (WH - ANES)
14.0000 mL/h | INTRAMUSCULAR | Status: DC | PRN
  Administered 2016-12-01: 14 mL/h via EPIDURAL
  Filled 2016-12-01: qty 100

## 2016-12-01 MED ORDER — OXYTOCIN 40 UNITS IN LACTATED RINGERS INFUSION - SIMPLE MED
1.0000 m[IU]/min | INTRAVENOUS | Status: DC
  Filled 2016-12-01: qty 1000

## 2016-12-01 MED ORDER — EPHEDRINE 5 MG/ML INJ
10.0000 mg | INTRAVENOUS | Status: DC | PRN
  Filled 2016-12-01: qty 4

## 2016-12-01 MED ORDER — MISOPROSTOL 25 MCG QUARTER TABLET
25.0000 ug | ORAL_TABLET | ORAL | Status: DC | PRN
  Administered 2016-12-01 (×2): 25 ug via VAGINAL
  Filled 2016-12-01 (×2): qty 0.25
  Filled 2016-12-01: qty 1

## 2016-12-01 MED ORDER — LABETALOL HCL 100 MG PO TABS: 100.0000 mg | ORAL_TABLET | Freq: Two times a day (BID) | ORAL | Status: DC

## 2016-12-01 MED ORDER — LACTATED RINGERS IV SOLN
INTRAVENOUS | Status: DC
  Administered 2016-12-01 (×3): via INTRAVENOUS
  Administered 2016-12-02: 250 mL via INTRAVENOUS

## 2016-12-01 MED ORDER — LIDOCAINE HCL (PF) 1 % IJ SOLN
INTRAMUSCULAR | Status: DC | PRN
  Administered 2016-12-01 (×2): 4 mL

## 2016-12-01 MED ORDER — LIDOCAINE HCL (PF) 1 % IJ SOLN
30.0000 mL | INTRAMUSCULAR | Status: DC | PRN
  Filled 2016-12-01: qty 30

## 2016-12-01 MED ORDER — OXYCODONE-ACETAMINOPHEN 5-325 MG PO TABS: 2.0000 | ORAL_TABLET | ORAL | Status: DC | PRN

## 2016-12-01 MED ORDER — SOD CITRATE-CITRIC ACID 500-334 MG/5ML PO SOLN: 30.0000 mL | ORAL | Status: DC | PRN

## 2016-12-01 MED ORDER — PHENYLEPHRINE 40 MCG/ML (10ML) SYRINGE FOR IV PUSH (FOR BLOOD PRESSURE SUPPORT)
80.0000 ug | PREFILLED_SYRINGE | INTRAVENOUS | Status: DC | PRN
  Filled 2016-12-01: qty 5
  Filled 2016-12-01: qty 10

## 2016-12-01 MED ORDER — OXYTOCIN BOLUS FROM INFUSION
500.0000 mL | Freq: Once | INTRAVENOUS | Status: AC
  Administered 2016-12-01: 500 mL via INTRAVENOUS

## 2016-12-01 MED ORDER — TERBUTALINE SULFATE 1 MG/ML IJ SOLN
0.2500 mg | Freq: Once | INTRAMUSCULAR | Status: DC | PRN
  Filled 2016-12-01: qty 1

## 2016-12-01 MED ORDER — DIPHENHYDRAMINE HCL 50 MG/ML IJ SOLN: 12.5000 mg | INTRAMUSCULAR | Status: DC | PRN

## 2016-12-01 MED ORDER — FENTANYL 2.5 MCG/ML BUPIVACAINE 1/10 % EPIDURAL INFUSION (WH - ANES): 14.0000 mL/h | INTRAMUSCULAR | Status: DC | PRN

## 2016-12-01 MED ORDER — FLEET ENEMA 7-19 GM/118ML RE ENEM: 1.0000 | ENEMA | RECTAL | Status: DC | PRN

## 2016-12-01 MED ORDER — LACTATED RINGERS IV SOLN
500.0000 mL | Freq: Once | INTRAVENOUS | Status: AC
  Administered 2016-12-01: 500 mL via INTRAVENOUS

## 2016-12-01 MED ORDER — OXYTOCIN 40 UNITS IN LACTATED RINGERS INFUSION - SIMPLE MED: 2.5000 [IU]/h | INTRAVENOUS | Status: DC

## 2016-12-01 MED ORDER — ACETAMINOPHEN 325 MG PO TABS
650.0000 mg | ORAL_TABLET | ORAL | Status: DC | PRN
Start: 2016-12-01 — End: 2016-12-02

## 2016-12-01 MED ORDER — AMLODIPINE BESYLATE 5 MG PO TABS
5.0000 mg | ORAL_TABLET | Freq: Every day | ORAL | Status: DC
  Administered 2016-12-02 – 2016-12-03 (×2): 5 mg via ORAL
  Filled 2016-12-01 (×2): qty 1

## 2016-12-01 MED ORDER — IBUPROFEN 600 MG PO TABS
600.0000 mg | ORAL_TABLET | Freq: Four times a day (QID) | ORAL | Status: DC
  Administered 2016-12-01 – 2016-12-03 (×6): 600 mg via ORAL
  Filled 2016-12-01 (×8): qty 1

## 2016-12-01 MED ORDER — LACTATED RINGERS IV SOLN: 500.0000 mL | INTRAVENOUS | Status: DC | PRN

## 2016-12-01 MED ORDER — FENTANYL CITRATE (PF) 100 MCG/2ML IJ SOLN
50.0000 ug | INTRAMUSCULAR | Status: DC | PRN
  Administered 2016-12-01: 50 ug via INTRAVENOUS
  Filled 2016-12-01: qty 2

## 2016-12-01 MED ORDER — FAMOTIDINE 20 MG PO TABS
20.0000 mg | ORAL_TABLET | Freq: Every morning | ORAL | Status: DC
  Administered 2016-12-01 – 2016-12-03 (×2): 20 mg via ORAL
  Filled 2016-12-01 (×3): qty 1

## 2016-12-01 MED ORDER — ONDANSETRON HCL 4 MG/2ML IJ SOLN
4.0000 mg | Freq: Four times a day (QID) | INTRAMUSCULAR | Status: DC | PRN
Start: 2016-12-01 — End: 2016-12-02

## 2016-12-01 NOTE — Anesthesia Preprocedure Evaluation (Signed)
Anesthesia Evaluation  Patient identified by MRN, date of birth, ID band Patient awake    Reviewed: Allergy & Precautions, NPO status , Patient's Chart, lab work & pertinent test results, reviewed documented beta blocker date and time   History of Anesthesia Complications (+) POST - OP SPINAL HEADACHE and history of anesthetic complications  Airway Mallampati: III  TM Distance: >3 FB Neck ROM: Full    Dental no notable dental hx.    Pulmonary neg pulmonary ROS,    Pulmonary exam normal breath sounds clear to auscultation       Cardiovascular hypertension, Pt. on medications and Pt. on home beta blockers Normal cardiovascular exam Rhythm:Regular Rate:Normal     Neuro/Psych  Headaches, negative psych ROS   GI/Hepatic negative GI ROS, Neg liver ROS,   Endo/Other  Morbid obesity  Renal/GU negative Renal ROS     Musculoskeletal negative musculoskeletal ROS (+)   Abdominal (+) + obese,   Peds  Hematology negative hematology ROS (+)   Anesthesia Other Findings   Reproductive/Obstetrics (+) Pregnancy                             Anesthesia Physical Anesthesia Plan  ASA: III  Anesthesia Plan: Epidural   Post-op Pain Management:    Induction:   Airway Management Planned:   Additional Equipment:   Intra-op Plan:   Post-operative Plan:   Informed Consent: I have reviewed the patients History and Physical, chart, labs and discussed the procedure including the risks, benefits and alternatives for the proposed anesthesia with the patient or authorized representative who has indicated his/her understanding and acceptance.     Plan Discussed with:   Anesthesia Plan Comments:         Anesthesia Quick Evaluation

## 2016-12-01 NOTE — Progress Notes (Signed)
Patient ID: Carolyn Ramsey, female   DOB: 08/02/1976, 41 y.o.   MRN: [id number]  S: Patient seen & examined for progress of labor. Patient beginning to feel uncomfortable with her contractions.    O:  Vitals:   12/01/16 1000 12/01/16 1204 12/01/16 1410 12/01/16 1616  BP: (!) 118/55  98/85 134/76  Pulse: 88  92 93  Resp: 18  17 18   Temp:  98.8 F (37.1 C)  98.1 F (36.7 C)  TempSrc:  Oral  Oral  Weight:      Height:        Dilation: 3 Effacement (%): 60 Station: -3 Presentation: Vertex Exam by:: dr Omer Jackmumaw   FHT: 135 bpm, min to mod var, + 10x10 accels, no 15 x 15 accels, no decels TOCO: q2-924min   A/P: Start pitocin  Continue expectant management Anticipate SVD

## 2016-12-01 NOTE — Progress Notes (Signed)
Patient ID: John Giovannibttsam Mclucas, female   DOB: 05/22/1976, 41 y.o.   MRN: [id number]  S: Patient seen & examined for progress of labor. Patient comfortable in bed, has been receiving cytotec and has FB in place.    O:  Vitals:   12/01/16 [id number] 12/01/16 0839 12/01/16 1000 12/01/16 1204  BP:  119/67 (!) 118/55   Pulse:  98 88   Resp:  18 18   Temp:  97.7 F (36.5 C)  98.8 F (37.1 C)  TempSrc:  Oral  Oral  Weight: 246 lb (111.6 kg)     Height: 5\' 6"  (1.676 m)       Dilation: 1 Effacement (%): 60 Station: Ballotable Presentation: Vertex Exam by:: dr Omer Jackmumaw   FHT: 130 bpm, min to mod var, no accels recently, no decels TOCO: None   A/P: FB in place Continue cytotec Continue expectant management Anticipate SVD

## 2016-12-01 NOTE — H&P (Signed)
LABOR AND DELIVERY ADMISSION HISTORY AND PHYSICAL NOTE  Ronnika Collett is a 41 y.o. female G12P4004 with IUP at [redacted]w[redacted]d by 10 wk [place] presenting for IOL for CHTN.   She reports positive fetal movement. She denies leakage of fluid or vaginal bleeding. Denies HAs, changes in vision, RUQ/epigastric pain. Patient took her medications this AM, including her labetalol.  Prenatal History/Complications:  Past Medical History: Past Medical History:  Diagnosis Date  . Hypertension   . Spinal headache     Past Surgical History: Past Surgical History:  Procedure Laterality Date  . NO PAST SURGERIES      Obstetrical History: OB History    Gravida Para Term Preterm AB Living   5 4 4  0 0 4   SAB TAB Ectopic Multiple Live Births   0 0 0 0 1      Social History: Social History   Social History  . Marital status: Married    Spouse name: N/A  . Number of children: N/A  . Years of education: N/A   Social History Main Topics  . Smoking status: Never Smoker  . Smokeless tobacco: Never Used  . Alcohol use No  . Drug use: No  . Sexual activity: Yes    Birth control/ protection: None   Other Topics Concern  . None   Social History Narrative   Lives with husband and 4 children.  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     Family History: History reviewed. No pertinent family history.  Allergies: No Known Allergies  Prescriptions Prior to Admission  Medication Sig Dispense Refill Last Dose  . aspirin EC 81 MG tablet Take 1 tablet (81 mg total) by mouth daily. 90 tablet 3 11/30/2016  . ferrous sulfate (FERROUSUL) 325 (65 FE) MG tablet Take 1 tablet (325 mg total) by mouth daily. 60 tablet 1 11/30/2016  . labetalol (NORMODYNE) 100 MG tablet Take 1 tablet (100 mg total) by mouth 2 (two) times daily. 60 tablet 2 11/30/2016  . nystatin ointment (MYCOSTATIN) Apply 1 application topically 2 (two) times daily. 30 g 1 11/30/2016  . Prenatal Vit-Fe Fumarate-FA (PRENATAL VITAMIN)  27-0.8 MG TABS Take 1 tablet by mouth daily. 30 tablet 3 11/30/2016  . ranitidine (ZANTAC) 150 MG tablet Take 1 tablet (150 mg total) by mouth 2 (two) times daily. 60 tablet 3 11/30/2016     Review of Systems   All systems reviewed and negative except as stated in HPI  Blood pressure 119/67, pulse 98, temperature 97.7 F (36.5 C), temperature source Oral, resp. rate 18, height 5\' 6"  (1.676 m), weight 246 lb (111.6 kg), last menstrual period 03/11/2016. General appearance: alert, cooperative, appears stated age and no distress Lungs: clear to auscultation bilaterally Heart: regular rate and rhythm Abdomen: soft, non-tender; bowel sounds normal Extremities: No calf swelling or tenderness Presentation: cephalic, confirmed by BSUS Fetal monitoring: 135 bpm, mod var, +accels, no decels Uterine activity: None Dilation: 1 Effacement (%): 60 Station: Ballotable Exam by:: dr Omer Jack   Prenatal labs: ABO, Rh: O/POS/-- (07/14 1032) Antibody: NEG (07/14 1032) Rubella: !Error! IMMUNE RPR: NON REAC (10/30 1009)  HBsAg: NEGATIVE (07/14 1032)  HIV: NONREACTIVE (10/30 1009)  GBS: Negative (12/15 0000)  1 hr Glucola: WNL Genetic screening:  Declined Anatomy US: Normal, girl  Prenatal Transfer Tool  Maternal Diabetes: No Genetic Screening: Declined Maternal Ultrasounds/Referrals: Normal Fetal Ultrasounds or other Referrals:  None Maternal Substance Abuse:  No Significant Maternal Medications:  Meds include: Other:  Labetalol 100  BID Significant Maternal Lab Results: Lab values include: Group B Strep negative  Results for orders placed or performed during the hospital encounter of 12/01/16 (from the past 24 hour(s))  CBC   Collection Time: 12/01/16  8:35 AM  Result Value Ref Range   WBC 7.2 4.0 - 10.5 K/uL   RBC 3.96 3.87 - 5.11 MIL/uL   Hemoglobin 10.8 (L) 12.0 - 15.0 g/dL   HCT 65.731.9 (L) 84.636.0 - 96.246.0 %   MCV 80.6 78.0 - 100.0 fL   MCH 27.3 26.0 - 34.0 pg   MCHC 33.9 30.0 - 36.0 g/dL    RDW 95.218.0 (H) 84.111.5 - 15.5 %   Platelets 229 150 - 400 K/uL    Patient Active Problem List   Diagnosis Date Noted  . Chronic hypertension 12/01/2016  . Language barrier 08/06/2016  . Supervision of high risk pregnancy, antepartum 06/12/2016  . Essential hypertension 11/14/2015  . Chronic hypertension in pregnancy 11/14/2012  . Female circumcision 08/17/2012    Assessment: John Giovannibttsam Devaul is a 41 y.o. G5P4004 at 9478w0d here for IOL for CHTN.  #Labor: IOL with cytotec, foley bulb placed with ease (inflated to 60cc) #Pain: IV pain meds prn, epidural on request #FWB:  Cat I #ID:  GBS Neg #MOF: Breast #MOC:Nexplanon #Circ:  N/A  Jen MowElizabeth Mumaw, DO OB Fellow [facility] 12/01/2016, 9:35 AM

## 2016-12-01 NOTE — Anesthesia Pain Management Evaluation Note (Signed)
  [facility] Pain Management Visit Note  Patient: Carolyn Ramsey, 41 y.o., female  "Hello [facility]. We have an anesthesia team available at all times to provide care throughout the hospital, including epidural management and anesthesia for C-section. I don't know your plan for the delivery whether it a natural birth, water birth, IV sedation, nitrous supplementation, doula or epidural, but we want to meet your pain goals."   1.Was your pain managed to your expectations on prior hospitalizations?   Yes   2.What is your expectation for pain management during this hospitalization?     Epidural  3.How can we help you reach that goal? epidural  Record the patient's initial score and the patient's pain goal.   Pain: 0/10  Pain Goal: 0/10 [facility] you to be able to say your pain was always managed very well.  Carolyn Ramsey, Carolyn Ramsey 12/01/2016

## 2016-12-01 NOTE — Anesthesia Procedure Notes (Signed)
Epidural Patient location during procedure: OB Start time: 12/01/2016 7:05 PM End time: 12/01/2016 7:15 PM  Staffing Anesthesiologist: Lewie LoronGERMEROTH, Jamille Fisher Performed: anesthesiologist   Preanesthetic Checklist Completed: patient identified, pre-op evaluation, timeout performed, IV checked, risks and benefits discussed and monitors and equipment checked  Epidural Patient position: sitting Prep: site prepped and draped and DuraPrep Patient monitoring: heart rate Approach: midline Location: L3-L4 Injection technique: LOR air and LOR saline  Needle:  Needle type: Tuohy  Needle gauge: 17 G Needle length: 9 cm Needle insertion depth: 6 cm Catheter type: closed end flexible Catheter size: 19 Gauge Catheter at skin depth: 12 cm Test dose: negative  Assessment Sensory level: T8 Events: blood not aspirated, injection not painful, no injection resistance, negative IV test and no paresthesia  Additional Notes Reason for block:procedure for pain

## 2016-12-01 NOTE — Progress Notes (Signed)
Patient ID: Carolyn Ramsey, female   DOB: 10/09/1976, 41 y.o.   MRN: [id number]  S: Patient seen & examined for progress of labor. Patient comfortable in the room.    O:  Vitals:   12/01/16 0839 12/01/16 1000 12/01/16 1204 12/01/16 1410  BP: 119/67 (!) 118/55  98/85  Pulse: 98 88  92  Resp: 18 18  17   Temp: 97.7 F (36.5 C)  98.8 F (37.1 C)   TempSrc: Oral  Oral   Weight:      Height:        Dilation: 3 Effacement (%): 60 Station: -3 Presentation: Vertex Exam by:: dr Omer Jackmumaw   FHT: 135 bpm, mod var, no accels, no decels TOCO: Irregular, q2-834min   A/P: FB out Repeating cytotec Continue expectant management Anticipate SVD

## 2016-12-02 LAB — CBC
HCT: 29.2 % — ABNORMAL LOW (ref 36.0–46.0)
HEMOGLOBIN: 10.1 g/dL — AB (ref 12.0–15.0)
MCH: 27.7 pg (ref 26.0–34.0)
MCHC: 34.6 g/dL (ref 30.0–36.0)
MCV: 80 fL (ref 78.0–100.0)
PLATELETS: 204 10*3/uL (ref 150–400)
RBC: 3.65 MIL/uL — AB (ref 3.87–5.11)
RDW: 18 % — ABNORMAL HIGH (ref 11.5–15.5)
WBC: 12.2 10*3/uL — AB (ref 4.0–10.5)

## 2016-12-02 MED ORDER — SIMETHICONE 80 MG PO CHEW: 80.0000 mg | CHEWABLE_TABLET | ORAL | Status: DC | PRN

## 2016-12-02 MED ORDER — DIPHENHYDRAMINE HCL 25 MG PO CAPS: 25.0000 mg | ORAL_CAPSULE | Freq: Four times a day (QID) | ORAL | Status: DC | PRN

## 2016-12-02 MED ORDER — PRENATAL MULTIVITAMIN CH
1.0000 | ORAL_TABLET | Freq: Every day | ORAL | Status: DC
  Administered 2016-12-02 – 2016-12-03 (×2): 1 via ORAL
  Filled 2016-12-02 (×2): qty 1

## 2016-12-02 MED ORDER — ONDANSETRON HCL 4 MG/2ML IJ SOLN: 4.0000 mg | INTRAMUSCULAR | Status: DC | PRN

## 2016-12-02 MED ORDER — COCONUT OIL OIL: 1.0000 "application " | TOPICAL_OIL | Status: DC | PRN

## 2016-12-02 MED ORDER — SENNOSIDES-DOCUSATE SODIUM 8.6-50 MG PO TABS
2.0000 | ORAL_TABLET | ORAL | Status: DC
  Administered 2016-12-02 – 2016-12-03 (×2): 2 via ORAL
  Filled 2016-12-02 (×2): qty 2

## 2016-12-02 MED ORDER — ZOLPIDEM TARTRATE 5 MG PO TABS: 5.0000 mg | ORAL_TABLET | Freq: Every evening | ORAL | Status: DC | PRN

## 2016-12-02 MED ORDER — ACETAMINOPHEN 325 MG PO TABS
650.0000 mg | ORAL_TABLET | ORAL | Status: DC | PRN
Start: 2016-12-02 — End: 2016-12-03
  Administered 2016-12-02 – 2016-12-03 (×5): 650 mg via ORAL
  Filled 2016-12-02 (×6): qty 2

## 2016-12-02 MED ORDER — WITCH HAZEL-GLYCERIN EX PADS: 1.0000 "application " | MEDICATED_PAD | CUTANEOUS | Status: DC | PRN

## 2016-12-02 MED ORDER — ONDANSETRON HCL 4 MG PO TABS: 4.0000 mg | ORAL_TABLET | ORAL | Status: DC | PRN

## 2016-12-02 MED ORDER — BENZOCAINE-MENTHOL 20-0.5 % EX AERO
1.0000 "application " | INHALATION_SPRAY | CUTANEOUS | Status: DC | PRN
  Administered 2016-12-02: 1 via TOPICAL
  Filled 2016-12-02: qty 56

## 2016-12-02 MED ORDER — DIBUCAINE 1 % RE OINT: 1.0000 "application " | TOPICAL_OINTMENT | RECTAL | Status: DC | PRN

## 2016-12-02 MED ORDER — TETANUS-DIPHTH-ACELL PERTUSSIS 5-2.5-18.5 LF-MCG/0.5 IM SUSP
0.5000 mL | Freq: Once | INTRAMUSCULAR | Status: AC
  Administered 2016-12-02: 0.5 mL via INTRAMUSCULAR

## 2016-12-02 NOTE — Progress Notes (Signed)
UR chart review completed.  

## 2016-12-02 NOTE — Lactation Note (Signed)
This note was copied from a baby's chart. Lactation Consultation Note: Lactation brochure given. Daughter is at bedside to translate . Mother is  experienced with breastfeeding other children for 2 yrs. Infant is 6416 hours old. Mother states she has breastfed infant 3 times. Latch score of 7 charted in the record. Mother getting ready for shower so I was unable to see a feeding.   Patient Name: Girl John Giovannibttsam Mane ZOXWR'UToday's Date: 12/02/2016 Reason for consult: Initial assessment   Maternal Data    Feeding    LATCH Score/Interventions                      Lactation Tools Discussed/Used     Consult Status Consult Status: Follow-up    Stevan BornKendrick, [place] [facility] 12/02/2016, 3:03 PM

## 2016-12-02 NOTE — Progress Notes (Signed)
POSTPARTUM PROGRESS NOTE  Post Partum Day 1  A phone interpreter was used for this interview.  Subjective:  Carolyn Ramsey is a 41 y.o. [id number] 5713w0d s/p SVD.  No acute events overnight.  Pt denies problems with voiding, complains of mild dizziness with ambulation and has not eaten since delivery but is ordering breakfast this morning.  She denies nausea or vomiting.  Pain is well controlled.  She has not had flatus since yesterday. She has not had bowel movement since delivery.  Lochia Moderate.   Objective: Blood pressure 127/68, pulse 91, temperature 98.7 F (37.1 C), temperature source Oral, resp. rate 18, height 5\' 6"  (1.676 m), weight 111.6 kg (246 lb), last menstrual period 03/11/2016, SpO2 99 %, unknown if currently breastfeeding.  Physical Exam:  General: alert, cooperative and no distress Lochia: normal flow Chest: CTAB, nl WOB on RA Heart: RRR no m/r/g Abdomen: soft, mildly TTP in lower quadrants Uterine Fundus: firm, appropriately tender DVT Evaluation: No calf swelling or tenderness Extremities: no edema   Recent Labs  12/01/16 1837 12/02/16 0501  HGB 10.6* 10.1*  HCT 31.5* 29.2*    Assessment/Plan:  ASSESSMENT: Carolyn Ramsey is a 41 y.o. [id number] 7113w0d PPD 1 s/p SVD. History of hypertension but vitals WNL this morning; will plan to continue norvasc and plan for discharge tomorrow. Plans to breastfeed and desires Nexplanon for contraception.   LOS: 1 day   Ennis FortsJessica Sibley, Medical Student  [facility], [facility]  12/02/2016, 7:51 AM

## 2016-12-03 MED ORDER — IBUPROFEN 600 MG PO TABS: 600.0000 mg | ORAL_TABLET | Freq: Four times a day (QID) | ORAL | 0 refills | Status: DC

## 2016-12-03 MED ORDER — POLYETHYLENE GLYCOL 3350 17 G PO PACK
17.0000 g | PACK | Freq: Every day | ORAL | Status: DC
  Administered 2016-12-03: 17 g via ORAL
  Filled 2016-12-03: qty 1

## 2016-12-03 MED ORDER — AMLODIPINE BESYLATE 5 MG PO TABS: 5.0000 mg | ORAL_TABLET | Freq: Every day | ORAL | 2 refills | Status: DC

## 2016-12-03 NOTE — Progress Notes (Signed)
Unable to get translator through [employer]. Family member, Archie PattenMohamed Togol, is here, speaks English, and able to translate for patient and complete discharge teaching. Waiver for translator signed.

## 2016-12-03 NOTE — Discharge Instructions (Signed)

## 2016-12-03 NOTE — Discharge Summary (Signed)
OB Discharge Summary     Patient Name: Josee Speece DOB: 04-30-1976 MRN: [id number]  Date of admission: 12/01/2016 Delivering MD: Shawna Clamp R   Date of discharge: 12/03/2016  Admitting diagnosis: INDUCTION Intrauterine pregnancy: [redacted]w[redacted]d     Secondary diagnosis:  Active Problems:   Chronic hypertension  Additional problems: None     Discharge diagnosis: CHTN                                                                                                Post partum procedures:none  Augmentation: Cytotec and Foley Balloon  Complications: None  Hospital course:  Induction of Labor With Vaginal Delivery   41 y.o. yo G5P5005 at [redacted]w[redacted]d was admitted to the hospital 12/01/2016 for induction of labor.  Indication for induction: chronic hypertension.  Patient had an uncomplicated labor course as follows: Membrane Rupture Time/Date: 9:04 PM ,12/01/2016   Intrapartum Procedures: Episiotomy: None [1]                                         Lacerations:  2nd degree [3]  Patient had delivery of a Viable infant.  Information for the patient's newborn:  Dartha, Rozzell [[id number]]      12/01/2016  Details of delivery can be found in separate delivery note.  Patient had a routine postpartum course. Patient is discharged home 12/03/16.   Physical exam  Vitals:   12/02/16 1250 12/02/16 1630 12/02/16 1820 12/03/16 0549  BP: 119/67 132/87 124/82 135/81  Pulse: 90 92 92 95  Resp: 18 18 18 20   Temp:  97.3 F (36.3 C) 98 F (36.7 C) 97.4 F (36.3 C)  TempSrc:  Oral Oral Oral  SpO2:      Weight:      Height:       General: alert, cooperative and no distress Lochia: appropriate Uterine Fundus: firm Incision: N/A DVT Evaluation: No evidence of DVT seen on physical exam. Negative Homan's sign. No cords or calf tenderness. No significant calf/ankle edema. Labs: Lab Results  Component Value Date   WBC 12.2 (H) 12/02/2016   HGB 10.1 (L) 12/02/2016   HCT 29.2 (L) 12/02/2016   MCV 80.0 12/02/2016   PLT 204 12/02/2016   CMP Latest Ref Rng & Units 06/15/2016  Glucose 65 - 99 mg/dL 86  BUN 7 - 25 mg/dL 7  Creatinine 7.82 - 9.56 mg/dL 2.13  Sodium 086 - 578 mmol/L 136  Potassium 3.5 - 5.3 mmol/L 4.0  Chloride 98 - 110 mmol/L 105  CO2 20 - 31 mmol/L 19(L)  Calcium 8.6 - 10.2 mg/dL 4.6(N)  Total Protein 6.1 - 8.1 g/dL 6.5  Total Bilirubin 0.2 - 1.2 mg/dL 0.2  Alkaline Phos 33 - 115 U/L 61  AST 10 - 30 U/L 15  ALT 6 - 29 U/L 16    Discharge instruction: per After Visit Summary and "Baby and Me Booklet".  After visit meds:  Allergies as of 12/03/2016   No Known Allergies  Medication List    STOP taking these medications   aspirin EC 81 MG tablet   labetalol 100 MG tablet Commonly known as:  NORMODYNE   nystatin ointment Commonly known as:  MYCOSTATIN   Prenatal Vitamin 27-0.8 MG Tabs     TAKE these medications   acetaminophen 500 MG tablet Commonly known as:  TYLENOL Take 500 mg by mouth every 6 (six) hours as needed.   amLODipine 5 MG tablet Commonly known as:  NORVASC Take 1 tablet (5 mg total) by mouth daily. Start taking on:  12/04/2016   ferrous sulfate 325 (65 FE) MG tablet Commonly known as:  FERROUSUL Take 1 tablet (325 mg total) by mouth daily.   ibuprofen 600 MG tablet Commonly known as:  ADVIL,MOTRIN Take 1 tablet (600 mg total) by mouth every 6 (six) hours.   ranitidine 150 MG tablet Commonly known as:  ZANTAC Take 1 tablet (150 mg total) by mouth 2 (two) times daily.       Diet: low salt diet  Activity: Advance as tolerated. Pelvic rest for 6 weeks.   Outpatient follow up:6 weeks Follow up Appt: Future Appointments Date Time Provider Department Center  01/05/2017 10:00 AM Aviva SignsMarie L Williams, CNM [facility]   Follow up Visit:No Follow-up on file.  Postpartum contraception: Nexplanon  Newborn Data: Live born female  Birth Weight: 7 lb 1.8 oz (3226 g) APGAR: 8, 9  Baby Feeding: Breast Disposition:home with  mother   12/03/2016 Beaulah Dinninghristina M Gambino, MD   CNM attestation I have seen and examined this patient and agree with above documentation in the resident's note.   John Giovannibttsam Torry is a 41 y.o. [id number] s/p SVD.   Pain is well controlled.  Plan for birth control is Nexplanon.  Method of Feeding: breast  PE:  BP 135/81 (BP Location: Right Arm)   Pulse 95   Temp 97.4 F (36.3 C) (Oral)   Resp 20   Ht 5\' 6"  (1.676 m)   Wt 111.6 kg (246 lb)   LMP 03/11/2016 (Approximate) Comment: Patient had iud removed, missed period in May  SpO2 99%   Breastfeeding? Unknown   BMI 39.71 kg/m  Fundus firm   Recent Labs  12/02/16 0501  HGB 10.1*  HCT 29.2*     Plan: discharge today - postpartum care discussed - f/u clinic in 6 weeks for postpartum visit   Lulie Hurd, CNM 12:27 AM

## 2016-12-07 NOTE — Anesthesia Postprocedure Evaluation (Signed)
Anesthesia Post Note  Patient: Carolyn Ramsey  Procedure(s) Performed: * No procedures listed *  Patient location during evaluation: Mother Baby Anesthesia Type: Epidural Level of consciousness: awake and alert Pain management: pain level controlled Vital Signs Assessment: post-procedure vital signs reviewed and stable Respiratory status: spontaneous breathing, nonlabored ventilation and respiratory function stable Cardiovascular status: stable Postop Assessment: no headache, no backache and epidural receding Anesthetic complications: no       Last Vitals: There were no vitals filed for this visit.  Last Pain: There were no vitals filed for this visit.               Lewie LoronJohn Christyl Osentoski

## 2016-12-08 ENCOUNTER — Other Ambulatory Visit: Payer: Self-pay | Admitting: Family Medicine

## 2017-01-05 ENCOUNTER — Telehealth: Payer: Self-pay | Admitting: *Deleted

## 2017-01-05 ENCOUNTER — Ambulatory Visit: Payer: Medicaid Other | Admitting: Advanced Practice Midwife

## 2017-01-05 NOTE — Telephone Encounter (Signed)
Carolyn Ramsey missed a scheduled postpartum appointment- hx of htn , needs to be rescheduled. I called her with [employer] interpreter [phone] and she states she came today and was sent home because she brought her children. I explained due to flu restrictions- children are not allowed. We will reschedule and call her with new appointment..Marland Kitchen

## 2017-01-13 ENCOUNTER — Encounter: Payer: Self-pay | Admitting: Advanced Practice Midwife

## 2017-01-13 ENCOUNTER — Ambulatory Visit (INDEPENDENT_AMBULATORY_CARE_PROVIDER_SITE_OTHER): Payer: Medicaid Other | Admitting: Advanced Practice Midwife

## 2017-01-13 VITALS — BP 147/104 | HR 102 | Wt 226.0 lb

## 2017-01-13 DIAGNOSIS — B372 Candidiasis of skin and nail: Secondary | ICD-10-CM

## 2017-01-13 DIAGNOSIS — M549 Dorsalgia, unspecified: Secondary | ICD-10-CM

## 2017-01-13 DIAGNOSIS — O10919 Unspecified pre-existing hypertension complicating pregnancy, unspecified trimester: Secondary | ICD-10-CM

## 2017-01-13 DIAGNOSIS — Z3009 Encounter for other general counseling and advice on contraception: Secondary | ICD-10-CM

## 2017-01-13 LAB — POCT PREGNANCY, URINE: Preg Test, Ur: NEGATIVE

## 2017-01-13 MED ORDER — AMLODIPINE BESYLATE 5 MG PO TABS: 5.0000 mg | ORAL_TABLET | Freq: Every day | ORAL | 2 refills | Status: DC

## 2017-01-13 MED ORDER — NYSTATIN 100000 UNIT/GM EX POWD: Freq: Two times a day (BID) | CUTANEOUS | Status: DC

## 2017-01-13 NOTE — Progress Notes (Signed)
Subjective:   Used [employer] Video interpreter Carolyn Ramsey [id number]. C/o back pain where epidural was. C/o frequent headaches. Has had intercourse , last 2 days ago. Explained to her we can not do birth control today- will need to abstain from intercourse or use condoms and come back in 2 weeks. Is unsure which birth control she wants.   Carolyn Ramsey is a 41 y.o. female who presents for a postpartum visit. She is 6 weeks postpartum following a spontaneous vaginal delivery. I have fully reviewed the prenatal and intrapartum course. The delivery was at 39 gestational weeks. Outcome: spontaneous vaginal delivery. Anesthesia: epidural. Postpartum course has been normal. Baby's course has been normal. Baby is feeding by breast. Bleeding no bleeding. Bowel function is normal. Bladder function is normal. Patient is sexually active. Contraception method is none. Postpartum depression screening: negative.  The following portions of the patient's history were reviewed and updated as appropriate: allergies, current medications, past family history, past medical history, past social history, past surgical history and problem list.  Review of Systems A comprehensive review of systems was negative except for: Pain and itching of left breast on skin surface underneath breast, worse when baby is nursing.  No fever/chills, no discoloration of breasts, breasts feel cool to touch.   Objective:    There were no vitals taken for this visit.  VS reviewed, nursing note reviewed,  Constitutional: well developed, well nourished, no distress HEENT: normocephalic CV: normal rate Pulm/chest wall: normal effort Physical Examination: Breasts - right breast normal without mass, skin or nipple changes or axillary nodes, lactating, no erythema or tenderness, nipples normal, left breast with 2 patches in area where pt reports pain that are flaky/white and in crease below large breast, no erythema noted  Abdomen: soft Neuro: alert and  oriented x 3 Skin: warm, dry Psych: affect normal   Assessment:   1. Candidal skin infection --Reviewed s/sx of mastitis with pt, no evidence of this today - nystatin (MYCOSTATIN/NYSTOP) topical powder; Apply topically 2 (two) times daily.  2. Encounter for general counseling and advice on contraceptive management --Pt became pregnant b/c IUD was partially coming out of her cervix and was removed at 10 weeks of pregnancy so she is concerned about contraceptive effectiveness.  She reports her husband will not allow her to have a BTL.  Discussed LARCs as most effective forms of birth control.  Reviewed Nexplanon vs IUD (both hormonal and copper) including risks/benefits of each.  --Pt desires another IUD but is concerned that she cannot check her strings herself and wants appointments in the office to have the string checked.  Discussed with Carolyn Ramsey so we can make appointments for her as desired (Q 3 mos, Q 6 mos?).  --Since she had unprotected intercourse 2 days ago, recommend no unprotected intercourse x 2 weeks then appointment in office for IUD placement.  3. Postpartum examination following vaginal delivery   4. Chronic hypertension in obstetric context, unspecified trimester --BP borderline elevated today with frequent h/a.  H/a resolve easily with 500 mg Tylenol.  Encouraged pt to take Tylenol PRN, increase PO fluids, and f/u with Carolyn Ramsey if persistent or severe.   --Consult Dr Shawnie PonsPratt.  Recommend pt see primary care to follow up with HTN.  Continue Norvasc 5 mg daily at this time.  - amLODipine (NORVASC) 5 MG tablet; Take 1 tablet (5 mg total) by mouth daily.  Dispense: 30 tablet; Refill: 2  5.  Back pain starting 2 weeks after delivery --Likely musculoskeletal, related to  breastfeeding and carrying baby. --Heat/ice/Tylenol PRN, ibuprofen sparingly r/t BP.  Plan:    1. Contraception: none.  Plan for IUD. 2. Two weeks to have IUD placed 3.  Nystatin powder for yeast under left  breast 5.  Heat/ice/Tylenol for back pain 4.  Continue Norvasc daily, see primary care for BP follow up

## 2017-02-09 ENCOUNTER — Ambulatory Visit (INDEPENDENT_AMBULATORY_CARE_PROVIDER_SITE_OTHER): Payer: Medicaid Other | Admitting: Advanced Practice Midwife

## 2017-02-09 ENCOUNTER — Encounter: Payer: Self-pay | Admitting: Advanced Practice Midwife

## 2017-02-09 VITALS — BP 145/96 | HR 98 | Wt 222.3 lb

## 2017-02-09 DIAGNOSIS — Z30430 Encounter for insertion of intrauterine contraceptive device: Secondary | ICD-10-CM | POA: Diagnosis not present

## 2017-02-09 DIAGNOSIS — R070 Pain in throat: Secondary | ICD-10-CM

## 2017-02-09 DIAGNOSIS — L709 Acne, unspecified: Secondary | ICD-10-CM

## 2017-02-09 DIAGNOSIS — Z3202 Encounter for pregnancy test, result negative: Secondary | ICD-10-CM

## 2017-02-09 DIAGNOSIS — O10919 Unspecified pre-existing hypertension complicating pregnancy, unspecified trimester: Secondary | ICD-10-CM

## 2017-02-09 LAB — POCT PREGNANCY, URINE: PREG TEST UR: NEGATIVE

## 2017-02-09 MED ORDER — LEVONORGESTREL 18.6 MCG/DAY IU IUD
INTRAUTERINE_SYSTEM | Freq: Once | INTRAUTERINE | Status: AC
  Administered 2017-02-09: 17:00:00 via INTRAUTERINE

## 2017-02-09 MED ORDER — LEVONORGESTREL 18.6 MCG/DAY IU IUD: 1.0000 | INTRAUTERINE_SYSTEM | Freq: Once | INTRAUTERINE | 0 refills | Status: DC

## 2017-02-09 MED ORDER — LEVONORGESTREL 18.6 MCG/DAY IU IUD: 1.0000 | INTRAUTERINE_SYSTEM | Freq: Once | INTRAUTERINE | 0 refills | Status: AC

## 2017-02-09 MED ORDER — CLINDAMYCIN PHOS-BENZOYL PEROX 1-5 % EX GEL: Freq: Two times a day (BID) | CUTANEOUS | 5 refills | Status: DC

## 2017-02-09 MED ORDER — AMLODIPINE BESYLATE 5 MG PO TABS: 10.0000 mg | ORAL_TABLET | Freq: Every day | ORAL | 3 refills | Status: DC

## 2017-02-09 MED ORDER — OMEPRAZOLE 20 MG PO CPDR: DELAYED_RELEASE_CAPSULE | ORAL | 1 refills | Status: DC

## 2017-02-09 NOTE — Progress Notes (Signed)
Subjective:     Carolyn Ramsey is a 41 y.o. female here for a gyn visit for IUD placement. She also reports 2 other concerns at her visit.  She reports a rash on her skin that is resolved by a prescription medication but she is out. She shows the medication, clindamycin/benzoyl peroxide to the provider. Discussed the use of the medication for acne.  Pt agrees that this may be acne and reports the medication helps so she wants to continue.  She also reports burning pain in her throat that started 1 month ago but is intermittent, when drinking fluids but not every time.  She has hx of acid reflux/heartburn but this has improved since she is not pregnant.   She denies vaginal bleeding, vaginal itching/burning, urinary symptoms, h/a, dizziness, n/v, or fever/chills.     Gynecologic History No LMP recorded. Contraception: none Last Pap: 05/2016. Results were: normal Last mammogram: n/a.   Obstetric History OB History  Gravida Para Term Preterm AB Living  5 5 5  0 0 5  SAB TAB Ectopic Multiple Live Births  0 0 0 0 2    # Outcome Date GA Lbr Len/2nd Weight Sex Delivery Anes PTL Lv  5 Term 12/01/16 [redacted]w[redacted]d 12:40 / 00:38 7 lb 1.8 oz (3.226 kg) F Vag-Spont EPI  LIV  4 Term 09/04/12 [redacted]w[redacted]d 14:01 / 00:22 8 lb 4.3 oz (3.751 kg) M Vag-Spont None, Local  LIV     Birth Comments: wnl  3 Term 04/27/10   7 lb 0.9 oz (3.2 kg)  Vag-Spont     2 Term 08/24/04   7 lb 0.9 oz (3.2 kg)  Vag-Spont     1 Term 09/29/02   9 lb 4.2 oz (4.2 kg)  Vag-Spont          The following portions of the patient's history were reviewed and updated as appropriate: allergies, current medications, past family history, past medical history, past social history, past surgical history and problem list.  Review of Systems Pertinent items noted in HPI and remainder of comprehensive ROS otherwise negative.    Objective:      VS reviewed, nursing note reviewed,  Constitutional: well developed, well nourished, no distress HEENT:  normocephalic, throat with slight erythema, no edema noted.  No lymphadenopathy noted. CV: normal rate Pulm/chest wall: normal effort Breast Exam:  Deferred Abdomen: soft Neuro: alert and oriented x 3 Skin: warm, dry Psych: affect normal Pelvic exam: Cervix pink, visually closed, without lesion, scant white creamy discharge, vaginal walls and external genitalia normal  IUD Procedure Note Patient identified, informed consent performed.  Discussed risks of irregular bleeding, cramping, infection, malpositioning or misplacement of the IUD outside the uterus which may require further procedures. Time out was performed.  Urine pregnancy test negative.  Speculum placed in the vagina.  Cervix visualized.  Cleaned with Betadine x 2.  Grasped anteriorly with a single tooth tenaculum.  Uterus sounded to 8 cm.  Liletta IUD placed per manufacturer's recommendations.  Strings trimmed to 3 cm. Tenaculum was removed, good hemostasis noted.  Patient tolerated procedure well.   Patient was given post-procedure instructions. Patient was also asked to check IUD strings periodically and follow up in 4-6 weeks for IUD check.  Assessment:   1. Acne, unspecified acne type --Pt skin "rash" c/w acne.  Recommend she see primary care or dermatology as needed.  Renewed pt existing medication for now. - clindamycin-benzoyl peroxide (BENZACLIN) gel; Apply topically 2 (two) times daily.  Dispense: 25 g; Refill:  5  2. Encounter for insertion of intrauterine contraceptive device (IUD) --Pregnancy test negative, no unprotected intercourse in last 2 weeks.   --IUD inserted without difficulty, see above.  3. Throat pain in adult --Since pain is intermittent, r/t eating/drinking, and pt has hx of acid reflux, will treat for GERD. Pt to f/u with primary care as planned. - omeprazole (PRILOSEC) 20 MG capsule; Take daily for 2 weeks, continue if symptoms are improved.  Dispense: 30 capsule; Refill: 1  4. Chronic hypertension  in obstetric context, unspecified trimester --Consult Dr Alysia PennaErvin. Increase from Norvasc 5 mg daily to 10 mg daily. [facility] Recommend she make appointment as soon as possible to review non Gyn medical conditions including HTN. - amLODipine (NORVASC) 5 MG tablet; Take 2 tablets (10 mg total) by mouth daily.  Dispense: 30 tablet; Refill: 3     Plan:    Contraception: IUD.  Recommend annual female exam in 1 year, no Pap needed

## 2017-02-09 NOTE — Progress Notes (Signed)
Patient tolerated well.

## 2017-03-03 ENCOUNTER — Encounter (HOSPITAL_COMMUNITY): Payer: Self-pay | Admitting: Family Medicine

## 2017-03-03 ENCOUNTER — Ambulatory Visit (HOSPITAL_COMMUNITY)
Admission: EM | Admit: 2017-03-03 | Discharge: 2017-03-03 | Disposition: A | Discharge status: Home / Self Care | Payer: Self-pay | Attending: Internal Medicine | Admitting: Internal Medicine

## 2017-03-03 ENCOUNTER — Ambulatory Visit (INDEPENDENT_AMBULATORY_CARE_PROVIDER_SITE_OTHER): Payer: Self-pay

## 2017-03-03 DIAGNOSIS — I10 Essential (primary) hypertension: Secondary | ICD-10-CM | POA: Insufficient documentation

## 2017-03-03 DIAGNOSIS — R1310 Dysphagia, unspecified: Secondary | ICD-10-CM

## 2017-03-03 DIAGNOSIS — R51 Headache: Secondary | ICD-10-CM | POA: Insufficient documentation

## 2017-03-03 DIAGNOSIS — R000 Tachycardia, unspecified: Secondary | ICD-10-CM | POA: Insufficient documentation

## 2017-03-03 DIAGNOSIS — Z79899 Other long term (current) drug therapy: Secondary | ICD-10-CM | POA: Insufficient documentation

## 2017-03-03 DIAGNOSIS — R519 Headache, unspecified: Secondary | ICD-10-CM

## 2017-03-03 LAB — CBC WITH DIFFERENTIAL/PLATELET
Basophils Absolute: 0 10*3/uL (ref 0.0–0.1)
Basophils Relative: 0 %
Eosinophils Absolute: 0.1 10*3/uL (ref 0.0–0.7)
Eosinophils Relative: 1 %
HCT: 36.5 % (ref 36.0–46.0)
Hemoglobin: 11.8 g/dL — ABNORMAL LOW (ref 12.0–15.0)
Lymphocytes Relative: 29 %
Lymphs Abs: 2.7 10*3/uL (ref 0.7–4.0)
MCH: 26.8 pg (ref 26.0–34.0)
MCHC: 32.3 g/dL (ref 30.0–36.0)
MCV: 82.8 fL (ref 78.0–100.0)
Monocytes Absolute: 0.8 10*3/uL (ref 0.1–1.0)
Monocytes Relative: 9 %
Neutro Abs: 5.8 10*3/uL (ref 1.7–7.7)
Neutrophils Relative %: 61 %
Platelets: 283 10*3/uL (ref 150–400)
RBC: 4.41 MIL/uL (ref 3.87–5.11)
RDW: 14 % (ref 11.5–15.5)
WBC: 9.4 10*3/uL (ref 4.0–10.5)

## 2017-03-03 LAB — HEPATIC FUNCTION PANEL
ALT: 17 U/L (ref 14–54)
AST: 19 U/L (ref 15–41)
Albumin: 4.1 g/dL (ref 3.5–5.0)
Alkaline Phosphatase: 70 U/L (ref 38–126)
Bilirubin, Direct: <0.1 mg/dL — ABNORMAL LOW (ref 0.1–0.5)
Total Bilirubin: 0.5 mg/dL (ref 0.3–1.2)
Total Protein: 8.2 g/dL — ABNORMAL HIGH (ref 6.5–8.1)

## 2017-03-03 LAB — POCT I-STAT, CHEM 8
BUN: 9 mg/dL (ref 6–20)
CALCIUM ION: 1.15 mmol/L (ref 1.15–1.40)
Chloride: 103 mmol/L (ref 101–111)
Creatinine, Ser: 0.4 mg/dL — ABNORMAL LOW (ref 0.44–1.00)
Glucose, Bld: 95 mg/dL (ref 65–99)
HEMATOCRIT: 38 % (ref 36.0–46.0)
HEMOGLOBIN: 12.9 g/dL (ref 12.0–15.0)
Potassium: 3.5 mmol/L (ref 3.5–5.1)
SODIUM: 141 mmol/L (ref 135–145)
TCO2: 28 mmol/L (ref 0–100)

## 2017-03-03 LAB — TSH: TSH: 1.164 u[IU]/mL (ref 0.350–4.500)

## 2017-03-03 MED ORDER — LABETALOL HCL 100 MG PO TABS: 100.0000 mg | ORAL_TABLET | Freq: Two times a day (BID) | ORAL | 0 refills | Status: DC

## 2017-03-03 NOTE — ED Provider Notes (Signed)
CSN: [id number]     Arrival date & time 03/03/17  1300 History   First MD Initiated Contact with Patient 03/03/17 1318     Chief Complaint  Patient presents with  . Headache   (Consider location/radiation/quality/duration/timing/severity/associated sxs/prior Treatment)  Language barrier. Pt speaks Arabic. Professional interpretor used throughout UC visit.   HPI  Carolyn Ramsey is a 41 y.o. female presenting to UC with c/o generalized HA, body aches, hot flashes, sore throat and elevated blood pressure for several months.  She was seen last month in the emergency department and advised her symptoms were due to HTN. She was prescribed amlodipine  once daily, which was then increased to  once daily by her OB/GYN.  Pt cannot f/u with her PCP until 03/25/17 but notes she hopes to have her symptoms better before then as it is so far away. Denies chest pain or SOB.  She notes she may be coming down with a cold as well but denies recorded fever.  HA is worse on Right side of head. Aching and throbbing. Denies numbness or tingling in arms or legs.  Throat pain is described as something feeling stuck in her throat but no difficulty breathing.  She does not think her OB/GYN tested her thyroid during the last visit.    Past Medical History:  Diagnosis Date  . Hypertension   . Spinal headache    Past Surgical History:  Procedure Laterality Date  . NO PAST SURGERIES     History reviewed. No pertinent family history. Social History  Substance Use Topics  . Smoking status: Never Smoker  . Smokeless tobacco: Never Used  . Alcohol use No   OB History    Gravida Para Term Preterm AB Living   0 0 5   SAB TAB Ectopic Multiple Live Births   0 0 0 0 2     Review of Systems  Constitutional: Negative for chills and fever.  HENT: Positive for sore throat. Negative for congestion, ear pain, trouble swallowing and voice change.   Respiratory: Negative for cough and shortness of breath.    Cardiovascular: Negative for chest pain and palpitations.  Gastrointestinal: Negative for abdominal pain, diarrhea, nausea and vomiting.  Musculoskeletal: Positive for arthralgias and myalgias. Negative for back pain.       Body aches  Skin: Negative for rash.  Neurological: Positive for headaches. Negative for dizziness and light-headedness.    Allergies  Patient has no known allergies.  Home Medications   Prior to Admission medications   Medication Sig Start Date End Date Taking? Authorizing Provider  acetaminophen (TYLENOL) 500 MG tablet Take 500 mg by mouth every 6 (six) hours as needed.    Historical Provider, MD  amLODipine (NORVASC) 5 MG tablet Take 2 tablets (10 mg total) by mouth daily. 02/09/17   Lisa A Leftwich-Kirby, CNM  clindamycin-benzoyl peroxide (BENZACLIN) gel Apply topically 2 (two) times daily. 02/09/17   Wilmer Floor Leftwich-Kirby, CNM  ferrous sulfate (FERROUSUL) 325 (65 FE) MG tablet Take 1 tablet (325 mg total) by mouth daily. Patient not taking: Reported on 01/13/2017 10/13/16   Misty Stanley A Leftwich-Kirby, CNM  ibuprofen (ADVIL,MOTRIN) 600 MG tablet Take 1 tablet (600 mg total) by mouth every 6 (six) hours. Patient not taking: Reported on 01/13/2017 12/03/16   Beaulah Dinning, MD  levonorgestrel (LILETTA, 52 MG,) 18.6 MCG/DAY IUD IUD 1 Intra Uterine Device (1 each total) by Intrauterine route once. 02/09/17 02/09/17  Wilmer Floor Leftwich-Kirby, CNM  omeprazole (PRILOSEC)  20 MG capsule Take daily for 2 weeks, continue if symptoms are improved. 02/09/17   Wilmer Floor Leftwich-Kirby, CNM   Meds Ordered and Administered this Visit  Medications - No data to display  BP (!) 142/89   Pulse (!) 114   Temp 99.5 F (37.5 C) (Oral)   Resp 18   SpO2 98%  No data found.   Physical Exam  Constitutional: She is oriented to person, place, and time. She appears well-developed and well-nourished. No distress.  HENT:  Head: Normocephalic and atraumatic.  Right Ear: Tympanic membrane normal.   Left Ear: Tympanic membrane normal.  Nose: Nose normal.  Mouth/Throat: Uvula is midline, oropharynx is clear and moist and mucous membranes are normal.  Eyes: EOM are normal.  Neck: Normal range of motion. Neck supple.  Cardiovascular: Regular rhythm.  Tachycardia present.   Pulmonary/Chest: Effort normal and breath sounds normal. No stridor. No respiratory distress. She has no wheezes. She has no rales.  Musculoskeletal: Normal range of motion.  Lymphadenopathy:    She has no cervical adenopathy.  Neurological: She is alert and oriented to person, place, and time. No cranial nerve deficit. Coordination normal.  Skin: Skin is warm and dry. She is not diaphoretic.  Psychiatric: She has a normal mood and affect. Her behavior is normal.  Nursing note and vitals reviewed.   Urgent Care Course     Procedures (including critical care time)  Labs Review Labs Reviewed  CBC WITH DIFFERENTIAL/PLATELET - Abnormal; Notable for the following:       Result Value   Hemoglobin 11.8 (*)    All other components within normal limits  POCT I-STAT, CHEM 8 - Abnormal; Notable for the following:    Creatinine, Ser 0.40 (*)    All other components within normal limits  TSH  HEPATIC FUNCTION PANEL    Imaging Review Dg Neck Soft Tissue  Result Date: 03/03/2017 CLINICAL DATA:  Headache, cough dysphagia EXAM: NECK SOFT TISSUES - 1+ VIEW COMPARISON:  None. FINDINGS: There is no evidence of retropharyngeal soft tissue swelling or epiglottic enlargement. The cervical airway is unremarkable and no radio-opaque foreign body identified. IMPRESSION: Negative. Electronically Signed   By: Natasha Mead M.D.   On: 03/03/2017 13:58     MDM   1. Generalized headache   2. Dysphagia   3. Essential hypertension    Pt with hx of HTN c/o generalized HA for several months along with several other symptoms. F/u with PCP is not until 03/25/17.  Pt was evaluated in ED and seen by her OB/GYN for same within the last 1  month.  Pt is slightly tachycardic in UC. Normal neuro exam. Overall appears well.  Due to language barrier (professional interpreter used) difficult to determine if symptoms started after increased dose of amlodipine. Question if thyroid disease could be contributing to symptoms.   CBC and istat chem-8 unremarkable.  TSH sent to lab  Will add labetalol to help with high blood pressure to see if HA improves. Encouraged to f/u with PCP as scheduled. Discussed symptoms that warrant emergent care in the ED.      Junius Finner, PA-C 03/03/17 1549

## 2017-03-03 NOTE — ED Triage Notes (Signed)
Pt here for constant HA x months. sts that she can feel her heartbeat in her ears.

## 2017-03-24 ENCOUNTER — Ambulatory Visit (INDEPENDENT_AMBULATORY_CARE_PROVIDER_SITE_OTHER): Payer: Self-pay | Admitting: Advanced Practice Midwife

## 2017-03-24 VITALS — BP 134/87 | HR 89 | Wt 221.4 lb

## 2017-03-24 DIAGNOSIS — R519 Headache, unspecified: Secondary | ICD-10-CM

## 2017-03-24 DIAGNOSIS — Z30431 Encounter for routine checking of intrauterine contraceptive device: Secondary | ICD-10-CM

## 2017-03-24 DIAGNOSIS — R51 Headache: Secondary | ICD-10-CM

## 2017-03-24 MED ORDER — BUTALBITAL-APAP-CAFFEINE 50-325-40 MG PO TABS: 1.0000 | ORAL_TABLET | Freq: Four times a day (QID) | ORAL | 0 refills | Status: DC | PRN

## 2017-03-24 NOTE — Progress Notes (Signed)
Subjective:     Patient ID: Carolyn Ramsey, female   DOB: 07-27-1976, 41 y.o.   MRN: [id number]  HPI Pt presents to the office for IUD string check.  Liletta IUD placed on 02/09/17. Pt reports bleeding off and on after placement until 3 days ago, when bleeding stopped.  No other problems with IUD.  Pt reports daily h/a, that resolve with ibuprofen and concerns about her BP medication. She was prescribed Norvasc 10 mg daily by our office but went to Urgent Care recently for HTN and labetalol 100 mg BID was added.  She is out of Norvasc and wants her Rx renewed. She has appointment tomorrow with [facility] for primary care.    Review of Systems  Constitutional: Negative for chills, fatigue and fever.  Respiratory: Negative for shortness of breath.   Cardiovascular: Negative for chest pain.  Gastrointestinal: Negative for nausea and vomiting.  Genitourinary: Negative for difficulty urinating, dysuria, flank pain, pelvic pain, vaginal bleeding, vaginal discharge and vaginal pain.  Neurological: Positive for headaches. Negative for dizziness.  Psychiatric/Behavioral: Negative.        Objective:   Physical Exam VS reviewed, nursing note reviewed,  Constitutional: well developed, well nourished, no distress HEENT: normocephalic CV: normal rate Pulm/chest wall: normal effort Abdomen: soft Neuro: alert and oriented x 3 Skin: warm, dry Psych: affect normal  Pelvic exam: Cervix pink, visually closed, without lesion, scant white creamy discharge, IUD string visible, 4cm long at os, vaginal walls and external genitalia normal     Assessment:     1. Nonintractable episodic headache, unspecified headache type --F/U with primary care tomorrow. --Pt to take labetalol only until tomorrow, f/u with primary care about BP medications.  Norvasc not renewed today.  - butalbital-acetaminophen-caffeine (FIORICET, ESGIC) 50-325-40 MG tablet; Take 1-2 tablets by mouth every 6 (six) hours  as needed for headache.  Dispense: 20 tablet; Refill: 0  2. IUD check up --IUD string in place, wnl    Plan:     f/U with primary care as scheduled tomorrow Return in 1 year for well-woman visit

## 2017-03-24 NOTE — Progress Notes (Signed)
Discussed that patient needs to follow up with pcp with headaches and htn.Patient has an appointment tomorrow with pcp.

## 2017-03-25 ENCOUNTER — Encounter: Payer: Self-pay | Admitting: Family Medicine

## 2017-03-25 ENCOUNTER — Ambulatory Visit: Payer: Self-pay | Attending: Family Medicine | Admitting: Family Medicine

## 2017-03-25 VITALS — BP 147/94 | HR 101 | Temp 97.8°F | Ht 67.0 in | Wt 222.2 lb

## 2017-03-25 DIAGNOSIS — O10919 Unspecified pre-existing hypertension complicating pregnancy, unspecified trimester: Secondary | ICD-10-CM

## 2017-03-25 DIAGNOSIS — M791 Myalgia, unspecified site: Secondary | ICD-10-CM

## 2017-03-25 DIAGNOSIS — Z79899 Other long term (current) drug therapy: Secondary | ICD-10-CM | POA: Insufficient documentation

## 2017-03-25 DIAGNOSIS — J039 Acute tonsillitis, unspecified: Secondary | ICD-10-CM | POA: Insufficient documentation

## 2017-03-25 DIAGNOSIS — G4489 Other headache syndrome: Secondary | ICD-10-CM | POA: Insufficient documentation

## 2017-03-25 DIAGNOSIS — I10 Essential (primary) hypertension: Secondary | ICD-10-CM | POA: Insufficient documentation

## 2017-03-25 MED ORDER — LABETALOL HCL 200 MG PO TABS: 200.0000 mg | ORAL_TABLET | Freq: Two times a day (BID) | ORAL | 3 refills | Status: DC

## 2017-03-25 MED ORDER — CETIRIZINE HCL 10 MG PO TABS: 10.0000 mg | ORAL_TABLET | Freq: Every day | ORAL | 1 refills | Status: DC

## 2017-03-25 MED ORDER — AMOXICILLIN 500 MG PO CAPS: 500.0000 mg | ORAL_CAPSULE | Freq: Three times a day (TID) | ORAL | 0 refills | Status: DC

## 2017-03-25 NOTE — Progress Notes (Signed)
Subjective:  Patient ID: Carolyn Ramsey, female    DOB: November 27, 1976  Age: 41 y.o. MRN: [id number]  CC: Hypertension; Fatigue; Headache (right sided); and Fever ("everyday")   HPI Carolyn Ramsey is a 41 year old female who is 3 months postpartum with a history of hypertension, chronic headaches who presents today with pain in multiple joints including her shoulders, neck, knees and body aches for the last 2 weeks. She endorses subjective fever yesterday and complains of sore throat and postnasal drip with production of mucus she is nasty tasting.  She complains of headache which is located on the central aspect of her head and is present all the time. She currently does not take any medications for this. She received tissue resected prescription from her GYN yesterday which she is yet to feel.  Also complains of right breast pain but no lumps; she is currently nursing.   Past Medical History:  Diagnosis Date  . Hypertension   . Spinal headache     Past Surgical History:  Procedure Laterality Date  . NO PAST SURGERIES      No Known Allergies   Outpatient Medications Prior to Visit  Medication Sig Dispense Refill  . acetaminophen (TYLENOL) 500 MG tablet Take 500 mg by mouth every 6 (six) hours as needed.    Marland Kitchen amLODipine (NORVASC) 5 MG tablet Take 2 tablets (10 mg total) by mouth daily. 30 tablet 3  . labetalol (NORMODYNE) 100 MG tablet Take 1 tablet (100 mg total) by mouth 2 (two) times daily. 44 tablet 0  . butalbital-acetaminophen-caffeine (FIORICET, ESGIC) 50-325-40 MG tablet Take 1-2 tablets by mouth every 6 (six) hours as needed for headache. (Patient not taking: Reported on 03/25/2017) 20 tablet 0  . ibuprofen (ADVIL,MOTRIN) 600 MG tablet Take 1 tablet (600 mg total) by mouth every 6 (six) hours. (Patient not taking: Reported on 03/25/2017) 30 tablet 0  . levonorgestrel (LILETTA, 52 MG,) 18.6 MCG/DAY IUD IUD 1 Intra Uterine Device (1 each total) by Intrauterine route once. 1  each 0   Facility-Administered Medications Prior to Visit  Medication Dose Route Frequency Provider Last Rate Last Dose  . nystatin (MYCOSTATIN/NYSTOP) topical powder   Topical BID Lisa A Leftwich-Kirby, CNM        ROS Review of Systems  Constitutional: Positive for fatigue and fever. Negative for activity change and appetite change.  HENT: Positive for postnasal drip and sore throat. Negative for congestion and sinus pressure.   Eyes: Negative for visual disturbance.  Respiratory: Negative for cough, chest tightness, shortness of breath and wheezing.   Cardiovascular: Negative for chest pain and palpitations.  Gastrointestinal: Negative for abdominal distention, abdominal pain and constipation.  Endocrine: Negative for polydipsia.  Genitourinary: Negative for dysuria and frequency.  Musculoskeletal: Positive for myalgias. Negative for arthralgias and back pain.  Skin: Negative for rash.  Neurological: Positive for headaches. Negative for tremors, light-headedness and numbness.  Hematological: Does not bruise/bleed easily.  Psychiatric/Behavioral: Negative for agitation and behavioral problems.    Objective:  BP (!) 147/94 (BP Location: Right Arm, Patient Position: Sitting, Cuff Size: Small)   Pulse (!) 101   Temp 97.8 F (36.6 C) (Oral)   Ht 5' 7"  (1.702 m)   Wt 222 lb 3.2 oz (100.8 kg)   SpO2 96%   BMI 34.80 kg/m   BP/Weight 03/25/2017 01/18/7587 01/29/5497  Systolic BP 264 158 309  Diastolic BP 94 87 82  Wt. (Lbs) 222.2 221.4 -  BMI 34.8 35.73 -      Physical  Exam  Constitutional: She is oriented to person, place, and time. She appears well-developed and well-nourished.  HENT:  Right Ear: External ear normal.  Left Ear: External ear normal.  Enlarged inflamed tonsils, no exudate  Cardiovascular: Normal rate, normal heart sounds and intact distal pulses.   No murmur heard. Pulmonary/Chest: Effort normal and breath sounds normal. She has no wheezes. She has no rales.  She exhibits no tenderness. Right breast exhibits no mass, no skin change and no tenderness. Left breast exhibits no mass, no skin change and no tenderness.  Abdominal: Soft. Bowel sounds are normal. She exhibits no distension and no mass. There is no tenderness.  Musculoskeletal: Normal range of motion.  Neurological: She is alert and oriented to person, place, and time.  Skin: Skin is warm.  Psychiatric: She has a normal mood and affect.     Assessment & Plan:   1. Chronic hypertension in obstetric context, unspecified trimester Uncontrolled Discontinue amlodipine because she is lactating Increase dose of labetalol and low-sodium diet - labetalol (NORMODYNE) 200 MG tablet; Take 1 tablet (200 mg total) by mouth 2 (two) times daily.  Dispense: 60 tablet; Refill: 3 - [id number]+EGFR  2. Other headache syndrome Previous history of chronic headaches and was on Topamax Will hold off on Topamax as she is lactating Advised to fill prescription for Fioricet which she received from GYN  3. Tonsillitis - amoxicillin (AMOXIL) 500 MG capsule; Take 1 capsule (500 mg total) by mouth 3 (three) times daily.  Dispense: 30 capsule; Refill: 0 - cetirizine (ZYRTEC) 10 MG tablet; Take 1 tablet (10 mg total) by mouth daily.  Dispense: 30 tablet; Refill: 1 - CBC with Differential/Platelet - Culture, Group A Strep  4. Myalgia Be secondary to tonsillitis Patient also encouraged to ensure proper rest Coping with the stress of an infant could be contributing to this   Meds ordered this encounter  Medications  . labetalol (NORMODYNE) 200 MG tablet    Sig: Take 1 tablet (200 mg total) by mouth 2 (two) times daily.    Dispense:  60 tablet    Refill:  3    Discontinue amlodipine  . amoxicillin (AMOXIL) 500 MG capsule    Sig: Take 1 capsule (500 mg total) by mouth 3 (three) times daily.    Dispense:  30 capsule    Refill:  0  . cetirizine (ZYRTEC) 10 MG tablet    Sig: Take 1 tablet (10 mg total) by mouth  daily.    Dispense:  30 tablet    Refill:  1    Follow-up: Return in about 3 weeks (around 04/15/2017) for follow up of chronic medical conditions.   Arnoldo Morale MD

## 2017-03-26 LAB — CMP14+EGFR
ALT: 13 IU/L (ref 0–32)
AST: 15 IU/L (ref 0–40)
Albumin/Globulin Ratio: 1.7 (ref 1.2–2.2)
Albumin: 4.5 g/dL (ref 3.5–5.5)
Alkaline Phosphatase: 80 IU/L (ref 39–117)
BUN/Creatinine Ratio: 28 — ABNORMAL HIGH (ref 9–23)
BUN: 11 mg/dL (ref 6–20)
Bilirubin Total: 0.3 mg/dL (ref 0.0–1.2)
CALCIUM: 9.4 mg/dL (ref 8.7–10.2)
CO2: 23 mmol/L (ref 18–29)
CREATININE: 0.39 mg/dL — AB (ref 0.57–1.00)
Chloride: 101 mmol/L (ref 96–106)
GFR calc Af Amer: 153 mL/min/{1.73_m2} (ref >59)
GFR, EST NON AFRICAN AMERICAN: 133 mL/min/{1.73_m2} (ref >59)
Globulin, Total: 2.7 g/dL (ref 1.5–4.5)
Glucose: 86 mg/dL (ref 65–99)
Potassium: 4 mmol/L (ref 3.5–5.2)
Sodium: 140 mmol/L (ref 134–144)
Total Protein: 7.2 g/dL (ref 6.0–8.5)

## 2017-03-26 LAB — CBC WITH DIFFERENTIAL/PLATELET
BASOS: 1 %
Basophils Absolute: 0 10*3/uL (ref 0.0–0.2)
EOS (ABSOLUTE): 0.2 10*3/uL (ref 0.0–0.4)
EOS: 2 %
HEMATOCRIT: 35 % (ref 34.0–46.6)
HEMOGLOBIN: 11.4 g/dL (ref 11.1–15.9)
IMMATURE GRANS (ABS): 0 10*3/uL (ref 0.0–0.1)
IMMATURE GRANULOCYTES: 0 %
Lymphocytes Absolute: 2.3 10*3/uL (ref 0.7–3.1)
Lymphs: 35 %
MCH: 26.8 pg (ref 26.6–33.0)
MCHC: 32.6 g/dL (ref 31.5–35.7)
MCV: 82 fL (ref 79–97)
MONOCYTES: 7 %
Monocytes Absolute: 0.5 10*3/uL (ref 0.1–0.9)
NEUTROS PCT: 55 %
Neutrophils Absolute: 3.6 10*3/uL (ref 1.4–7.0)
Platelets: 297 10*3/uL (ref 150–379)
RBC: 4.25 x10E6/uL (ref 3.77–5.28)
RDW: 15.6 % — AB (ref 12.3–15.4)
WBC: 6.6 10*3/uL (ref 3.4–10.8)

## 2017-03-29 LAB — CULTURE, GROUP A STREP

## 2017-03-31 ENCOUNTER — Telehealth: Payer: Self-pay

## 2017-03-31 NOTE — Telephone Encounter (Signed)
-----   Message from Jaclyn Shaggy, MD sent at 03/29/2017  1:37 PM EDT ----- Throat culture revealed strep throat. Continue antibiotics which she was prescribed at her last visit.

## 2017-03-31 NOTE — Telephone Encounter (Signed)
Writer called patient through [employer] to inform patient of her lab results.  Patient stated that she is almost finished with the antibiotics and feels much better.  Patient to f/u with MD on 04/20/17

## 2017-04-20 ENCOUNTER — Ambulatory Visit: Payer: Self-pay | Attending: Family Medicine | Admitting: Family Medicine

## 2017-04-20 ENCOUNTER — Encounter: Payer: Self-pay | Admitting: Family Medicine

## 2017-04-20 VITALS — BP 151/108 | HR 87 | Temp 98.1°F | Resp 18 | Ht 67.0 in | Wt 218.4 lb

## 2017-04-20 DIAGNOSIS — Z79899 Other long term (current) drug therapy: Secondary | ICD-10-CM | POA: Insufficient documentation

## 2017-04-20 DIAGNOSIS — M25561 Pain in right knee: Secondary | ICD-10-CM

## 2017-04-20 DIAGNOSIS — M545 Low back pain, unspecified: Secondary | ICD-10-CM

## 2017-04-20 DIAGNOSIS — M542 Cervicalgia: Secondary | ICD-10-CM

## 2017-04-20 DIAGNOSIS — I10 Essential (primary) hypertension: Secondary | ICD-10-CM

## 2017-04-20 DIAGNOSIS — N61 Mastitis without abscess: Secondary | ICD-10-CM

## 2017-04-20 DIAGNOSIS — M25562 Pain in left knee: Secondary | ICD-10-CM | POA: Insufficient documentation

## 2017-04-20 MED ORDER — CEPHALEXIN 500 MG PO CAPS: 500.0000 mg | ORAL_CAPSULE | Freq: Two times a day (BID) | ORAL | 0 refills | Status: DC

## 2017-04-20 NOTE — Progress Notes (Signed)
Patient is here for f/up  Patient complains about neck & both knee pain   Patient complains about right breast pain she is breastfeeding

## 2017-04-20 NOTE — Progress Notes (Signed)
Subjective:  Patient ID: Carolyn Ramsey, female    DOB: 03-15-1976  Age: 41 y.o. MRN: [id number]  CC: Hypertension   HPI Carolyn Ramsey is a 41 year old female with a history of hypertension who presents today for follow-up visit. She had presented with myalgias at her last office visit and sore throat; cultures came back positive for strep and she completed a course of antibiotics with resolution of symptoms.  She complains of neck pain, low back pain, pain in her knees for the last couple of months but denies swelling of her knees. She does not take any over-the-counter medications. Denies history of trauma.  Blood pressure is elevated and she endorses taking labetalol orally once daily rather than twice daily because of the Ramadan fast.  Also complains of right breast pain, associated warmth; she is currently nursing.  Past Medical History:  Diagnosis Date  . Hypertension   . Spinal headache     No Known Allergies   Outpatient Medications Prior to Visit  Medication Sig Dispense Refill  . cetirizine (ZYRTEC) 10 MG tablet Take 1 tablet (10 mg total) by mouth daily. 30 tablet 1  . labetalol (NORMODYNE) 200 MG tablet Take 1 tablet (200 mg total) by mouth 2 (two) times daily. 60 tablet 3  . acetaminophen (TYLENOL) 500 MG tablet Take 500 mg by mouth every 6 (six) hours as needed.    Marland Kitchen amoxicillin (AMOXIL) 500 MG capsule Take 1 capsule (500 mg total) by mouth 3 (three) times daily. 30 capsule 0  . butalbital-acetaminophen-caffeine (FIORICET, ESGIC) 50-325-40 MG tablet Take 1-2 tablets by mouth every 6 (six) hours as needed for headache. (Patient not taking: Reported on 03/25/2017) 20 tablet 0  . ibuprofen (ADVIL,MOTRIN) 600 MG tablet Take 1 tablet (600 mg total) by mouth every 6 (six) hours. (Patient not taking: Reported on 03/25/2017) 30 tablet 0  . levonorgestrel (LILETTA, 52 MG,) 18.6 MCG/DAY IUD IUD 1 Intra Uterine Device (1 each total) by Intrauterine route once. 1 each 0    Facility-Administered Medications Prior to Visit  Medication Dose Route Frequency Provider Last Rate Last Dose  . nystatin (MYCOSTATIN/NYSTOP) topical powder   Topical BID Leftwich-Kirby, Lisa A, CNM        ROS Review of Systems  Constitutional: Negative for activity change, appetite change and fatigue.  HENT: Negative for congestion, sinus pressure and sore throat.   Eyes: Negative for visual disturbance.  Respiratory: Negative for cough, chest tightness, shortness of breath and wheezing.   Cardiovascular: Negative for chest pain and palpitations.  Gastrointestinal: Negative for abdominal distention, abdominal pain and constipation.  Endocrine: Negative for polydipsia.  Genitourinary: Negative for dysuria and frequency.  Musculoskeletal:       See hpi  Skin: Negative for rash.  Neurological: Negative for tremors, light-headedness and numbness.  Hematological: Does not bruise/bleed easily.  Psychiatric/Behavioral: Negative for agitation and behavioral problems.    Objective:  BP (!) 151/108 (BP Location: Right Arm, Patient Position: Sitting, Cuff Size: Normal)   Pulse 87   Temp 98.1 F (36.7 C) (Oral)   Resp 18   Ht 5\' 7"  (1.702 m)   Wt 218 lb 6.4 oz (99.1 kg)   SpO2 98%   BMI 34.21 kg/m   BP/Weight 04/20/2017 03/25/2017 03/24/2017  Systolic BP 151 147 134  Diastolic BP 108 94 87  Wt. (Lbs) 218.4 222.2 221.4  BMI 34.21 34.8 35.73     Physical Exam  Constitutional: She is oriented to person, place, and time. She appears well-developed and  well-nourished.  Cardiovascular: Normal rate, normal heart sounds and intact distal pulses.   No murmur heard. Pulmonary/Chest: Effort normal and breath sounds normal. She has no wheezes. She has no rales. She exhibits no tenderness. Right breast exhibits tenderness (no warmth elicited). Right breast exhibits no nipple discharge. Left breast exhibits no nipple discharge and no tenderness.  Abdominal: Soft. Bowel sounds are normal. She  exhibits no distension and no mass. There is no tenderness.  Musculoskeletal: Normal range of motion. She exhibits no tenderness (Negative straight leg raise bilaterally).  Crepitus on range of motion of left knee  Neurological: She is alert and oriented to person, place, and time.     Assessment & Plan:   1. Essential hypertension Uncontrolled Advised to take morning dose of labetalol with her morning meal at 4 AM and second dose with evening meal when she breaks her fast Low-sodium diet  2. Neck pain Advised to use Tylenol as she is nursing and other muscle relaxants are unsafe with pregnancy.  Back painApply heat to neck  3. Acute pain of both knees and back pain Could be underlying osteoarthritis and muscle spasm Apply heat  4. Mastitis Advised to ensure adequate emptying of breast either by nursing or expressing the milk. - cephALEXin (KEFLEX) 500 MG capsule; Take 1 capsule (500 mg total) by mouth 2 (two) times daily.  Dispense: 20 capsule; Refill: 0   Meds ordered this encounter  Medications  . cephALEXin (KEFLEX) 500 MG capsule    Sig: Take 1 capsule (500 mg total) by mouth 2 (two) times daily.    Dispense:  20 capsule    Refill:  0    Follow-up: Return in about 3 months (around 07/21/2017) for follow up of chronic medical conditions.   Jaclyn ShaggyEnobong Amao MD

## 2017-06-04 ENCOUNTER — Ambulatory Visit: Payer: Self-pay | Attending: Family Medicine

## 2017-06-07 ENCOUNTER — Other Ambulatory Visit: Payer: Self-pay | Admitting: *Deleted

## 2017-06-07 DIAGNOSIS — R51 Headache: Principal | ICD-10-CM

## 2017-06-07 DIAGNOSIS — R519 Headache, unspecified: Secondary | ICD-10-CM

## 2017-06-07 MED ORDER — BUTALBITAL-APAP-CAFFEINE 50-325-40 MG PO TABS: 1.0000 | ORAL_TABLET | Freq: Four times a day (QID) | ORAL | 2 refills | Status: DC | PRN

## 2017-07-14 ENCOUNTER — Ambulatory Visit: Payer: Self-pay | Attending: Family Medicine

## 2017-07-21 ENCOUNTER — Ambulatory Visit: Payer: Self-pay | Admitting: Family Medicine

## 2017-07-26 ENCOUNTER — Ambulatory Visit: Payer: Self-pay | Admitting: Family Medicine

## 2017-07-26 ENCOUNTER — Telehealth: Payer: Self-pay | Admitting: Family Medicine

## 2017-07-26 NOTE — Telephone Encounter (Signed)
Pt. Son called stating that pt. Was confused with her appt. Pt. Missed her appt today and wanted to have it rescheduled. Offered pt. An appt. For 10/9 and pt. Stated that she needed a morning appt. And the sooner I could give her was 10/16. Pt. Was upset and requested to speak with PCP. Please f/u with pt.

## 2017-07-28 ENCOUNTER — Ambulatory Visit: Payer: Self-pay

## 2017-08-17 ENCOUNTER — Other Ambulatory Visit: Payer: Self-pay | Admitting: General Practice

## 2017-08-17 DIAGNOSIS — R102 Pelvic and perineal pain: Secondary | ICD-10-CM

## 2017-08-18 ENCOUNTER — Ambulatory Visit (HOSPITAL_COMMUNITY)
Admission: RE | Admit: 2017-08-18 | Discharge: 2017-08-18 | Disposition: A | Discharge status: Home / Self Care | Payer: Self-pay | Source: Ambulatory Visit | Attending: Obstetrics & Gynecology | Admitting: Obstetrics & Gynecology

## 2017-08-18 DIAGNOSIS — R102 Pelvic and perineal pain: Secondary | ICD-10-CM | POA: Insufficient documentation

## 2017-08-18 DIAGNOSIS — Z975 Presence of (intrauterine) contraceptive device: Secondary | ICD-10-CM | POA: Insufficient documentation

## 2017-08-19 ENCOUNTER — Ambulatory Visit (INDEPENDENT_AMBULATORY_CARE_PROVIDER_SITE_OTHER): Payer: Self-pay | Admitting: Orthopedic Surgery

## 2017-08-19 ENCOUNTER — Encounter (INDEPENDENT_AMBULATORY_CARE_PROVIDER_SITE_OTHER): Payer: Self-pay | Admitting: Orthopedic Surgery

## 2017-08-19 ENCOUNTER — Ambulatory Visit (INDEPENDENT_AMBULATORY_CARE_PROVIDER_SITE_OTHER): Payer: Self-pay

## 2017-08-19 DIAGNOSIS — M25571 Pain in right ankle and joints of right foot: Secondary | ICD-10-CM

## 2017-08-19 NOTE — Progress Notes (Signed)
Office Visit Note   Patient: Carolyn Ramsey           Date of Birth: 10-03-1976           MRN: [id number] Visit Date: 08/19/2017 Requested by: No referring provider defined for this encounter. PCP: Jaclyn Shaggy, MD  Subjective: Chief Complaint  Patient presents with  . Right Foot - Pain  . Right Ankle - Pain    HPI: patient is a 41 year old female with rightplantar heel pain for 5 months.  Reports sharp stabbing pain with ambulation which is worse in the morning and worse at the end of the day.  It is painful to weight-bear after prolonged sitting and resting. She is here with a translator today which adds to the complexity of the clinic visit.  Denies any numbness and tingling in her foot.  She just describes pain.              ROS: All systems reviewed are negative as they relate to the chief complaint within the history of present illness.  Patient denies  fevers or chills.   Assessment & Plan: Visit Diagnoses:  1. Pain in right ankle and joints of right foot     Plan: impression is plantar fasciitis right foot.  Plan is heel cord stretching which I demonstrated this good gelnsert which is provided as well as Pennsaid samples provided.  She has some type of cultural discoloration on the plantar aspect of both feet and hands.  I'll think an injection through this pigmentation is safe or indicated at this time.  I will see her back as needed  Follow-Up Instructions: Return if symptoms worsen or fail to improve.   Orders:  Orders Placed This Encounter  Procedures  . XR Ankle Complete Right   No orders of the defined types were placed in this encounter.     Procedures: No procedures performed   Clinical Data: No additional findings.  Objective: Vital Signs: There were no vitals taken for this visit.  Physical Exam:   Constitutional: Patient appears well-developed HEENT:  Head: Normocephalic Eyes:EOM are normal Neck: Normal range of motion Cardiovascular:  Normal rate Pulmonary/chest: Effort normal Neurologic: Patient is alert Skin: Skin is warm Psychiatric: Patient has normal mood and affect    Ortho Exam: orthopedic exam demonstrates no nerve root tension signs.  There is black pigmentation on the planatr aspect of both feet.  Patient has palpable nontender intact anterior to posterior peroneal and Achilles tendons.  Peddal pulses pal lpable.  No other masse noted in the right foot regio  She does have tenderness to palpation of the plantar aspect of the heel but no Achilles tendon tenderness.  Negative squeeze test of the calcaneus.  No other masses lymph adenopathy or skin changes noted in the right foot region other than those mentioned already.  Specialty Comments:  No specialty comments available.  Imaging: US Transvaginal Non-ob  Result Date: 08/18/2017 CLINICAL DATA:  41 year old female with pelvic pain. Clinical concern for IUD expulsion. EXAM: TRANSABDOMINAL AND TRANSVAGINAL ULTRASOUND OF PELVIS TECHNIQUE: Both transabdominal and transvaginal ultrasound examinations of the pelvis were performed. Transabdominal technique was performed for global imaging of the pelvis including uterus, ovaries, adnexal regions, and pelvic cul-de-sac. It was necessary to proceed with endovaginal exam following the transabdominal exam to visualize the endometrium and adnexa. COMPARISON:  No prior non obstetric pelvic sonogram study. FINDINGS: Uterus Measurements: 8.2 x 3.9 x 5.2 cm. Anteverted uterus is normal in size and configuration. No uterine  fibroids or other myometrial abnormalities. Endometrium Thickness: 3 mm. Intrauterine device is well positioned within the uterine cavity, with no evidence of myometrial penetration by the side arms of the device. Endometrial evaluation is limited by acoustic shadowing from the IUD. No endometrial cavity fluid or focal endometrial mass. Right ovary Measurements: 1.6 x 1.4 x 1.4 cm. Normal appearance/no adnexal mass.  Left ovary Nonvisualization of the left ovary.  No left adnexal mass. Other findings No abnormal free fluid. IMPRESSION: 1. Well-positioned IUD in the uterine cavity. Normal anteverted uterus. 2. Normal right ovary. Nonvisualization of the left ovary. No adnexal masses. Electronically Signed   By: Delbert Phenix M.D.   On: 08/18/2017 16:31   US Pelvis Complete  Result Date: 08/18/2017 CLINICAL DATA:  41 year old female with pelvic pain. Clinical concern for IUD expulsion. EXAM: TRANSABDOMINAL AND TRANSVAGINAL ULTRASOUND OF PELVIS TECHNIQUE: Both transabdominal and transvaginal ultrasound examinations of the pelvis were performed. Transabdominal technique was performed for global imaging of the pelvis including uterus, ovaries, adnexal regions, and pelvic cul-de-sac. It was necessary to proceed with endovaginal exam following the transabdominal exam to visualize the endometrium and adnexa. COMPARISON:  No prior non obstetric pelvic sonogram study. FINDINGS: Uterus Measurements: 8.2 x 3.9 x 5.2 cm. Anteverted uterus is normal in size and configuration. No uterine fibroids or other myometrial abnormalities. Endometrium Thickness: 3 mm. Intrauterine device is well positioned within the uterine cavity, with no evidence of myometrial penetration by the side arms of the device. Endometrial evaluation is limited by acoustic shadowing from the IUD. No endometrial cavity fluid or focal endometrial mass. Right ovary Measurements: 1.6 x 1.4 x 1.4 cm. Normal appearance/no adnexal mass. Left ovary Nonvisualization of the left ovary.  No left adnexal mass. Other findings No abnormal free fluid. IMPRESSION: 1. Well-positioned IUD in the uterine cavity. Normal anteverted uterus. 2. Normal right ovary. Nonvisualization of the left ovary. No adnexal masses. Electronically Signed   By: Delbert Phenix M.D.   On: 08/18/2017 16:31   Xr Ankle Complete Right  Result Date: 08/19/2017 Right ankle AP lateral mortise reviewed.  No fracture  present.  Mild plantar calcaneal spur is present.  No dislocations malalignment or degenerative changes present.    PMFS History: Patient Active Problem List   Diagnosis Date Noted  . Language barrier 08/06/2016  . Essential hypertension 11/14/2015  . 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 Hypertension   . Spinal headache     No family history on file.  Past Surgical History:  Procedure Laterality Date  . NO PAST SURGERIES     Social History   Occupational History  . Not on file.   Social History Main Topics  . Smoking status: Never Smoker  . Smokeless tobacco: Never Used  . Alcohol use No  . Drug use: No  . Sexual activity: Yes    Birth control/ protection: None

## 2017-09-07 ENCOUNTER — Ambulatory Visit: Payer: Self-pay | Admitting: Family Medicine

## 2017-09-09 IMAGING — US US OB COMP LESS 14 WK
1 series · 15 of 28 positions shown · non-contrast
Comparison: None.

CLINICAL DATA: Pregnant, Mirena IUD since 7233

EXAM:
OBSTETRIC <14 WK ULTRASOUND
TECHNIQUE: Transvaginal ultrasound was performed for complete evaluation of the
gestation as well as the maternal uterus, adnexal regions, and
pelvic cul-de-sac.

[Series 1: us ob comp less 14 wk · 15 of 62 slices shown]
[im 1/62]
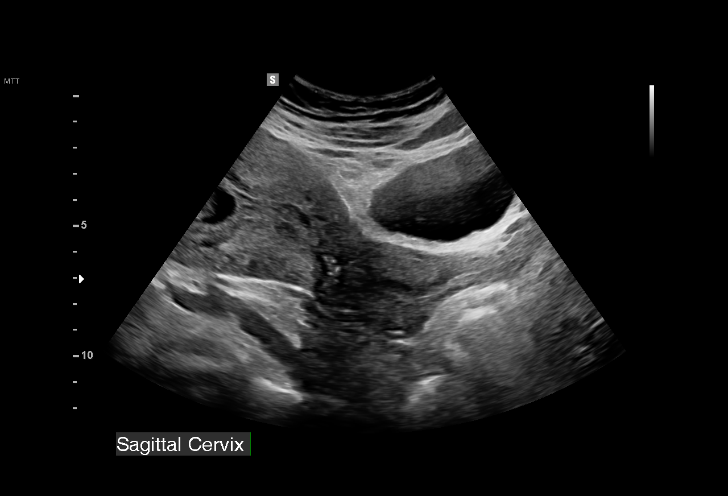
[im 5/62]
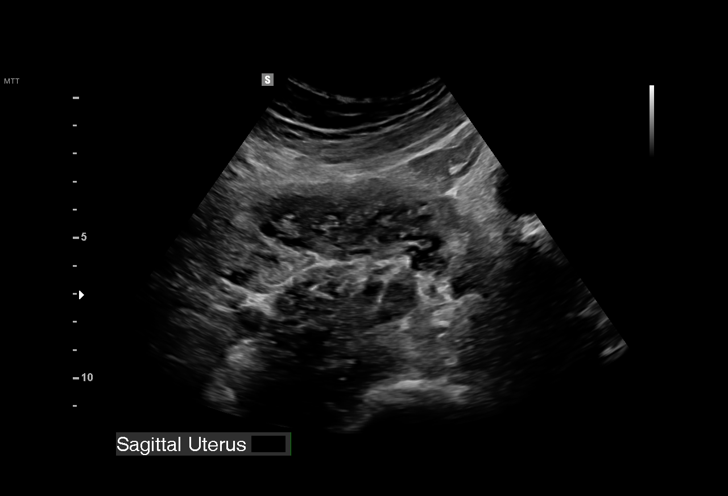
[im 10/62]
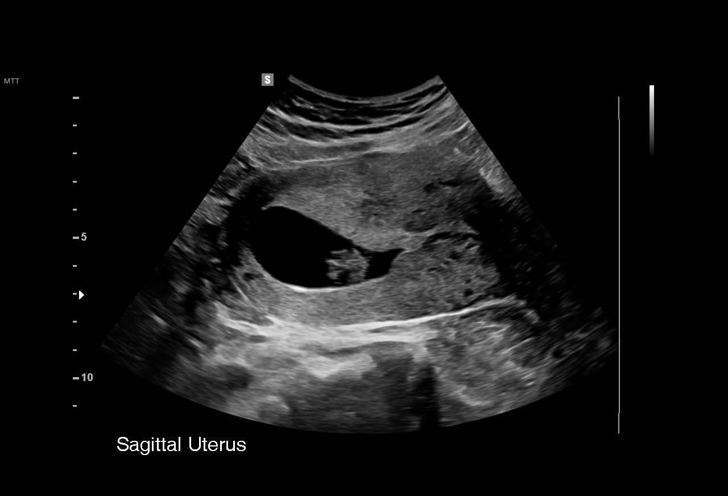
[im 14/62]
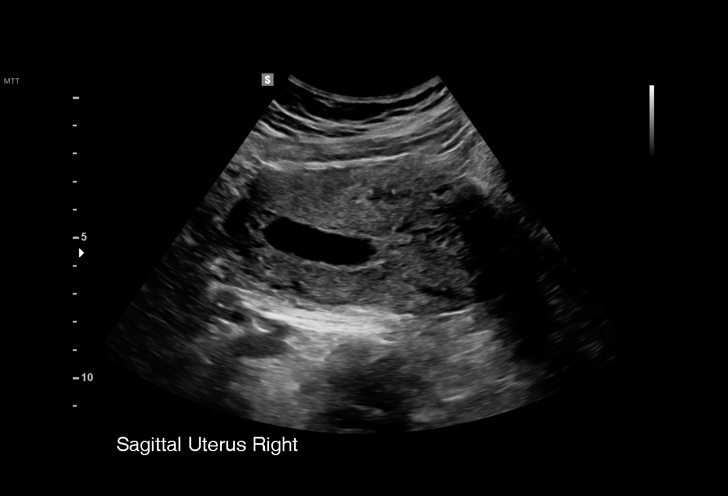
[im 19/62]
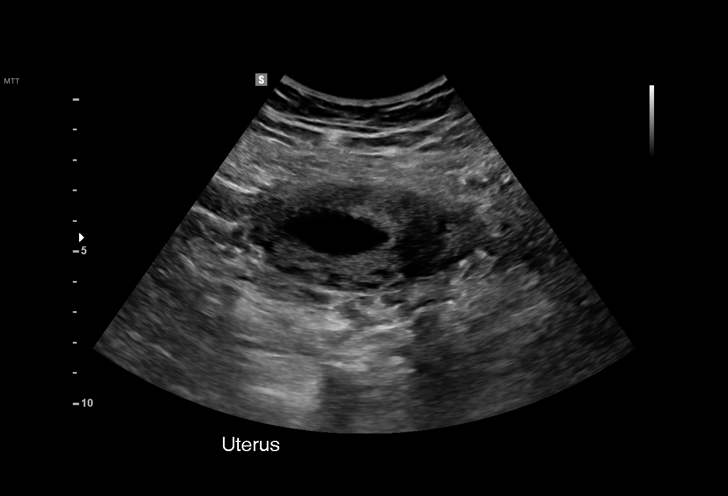
[im 23/62]
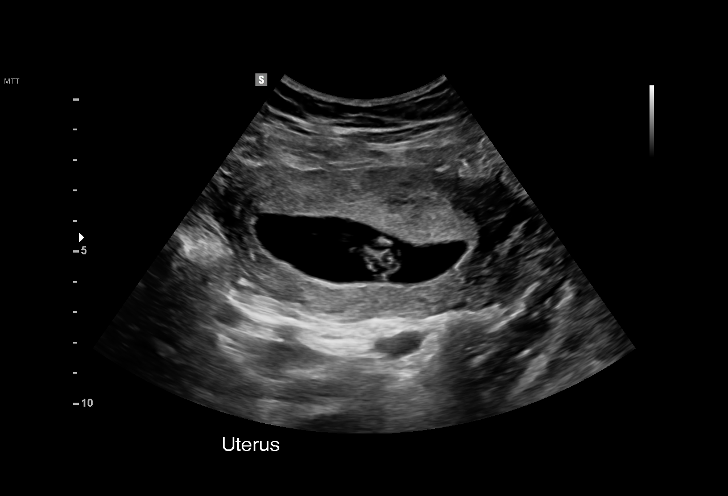
[im 28/62]
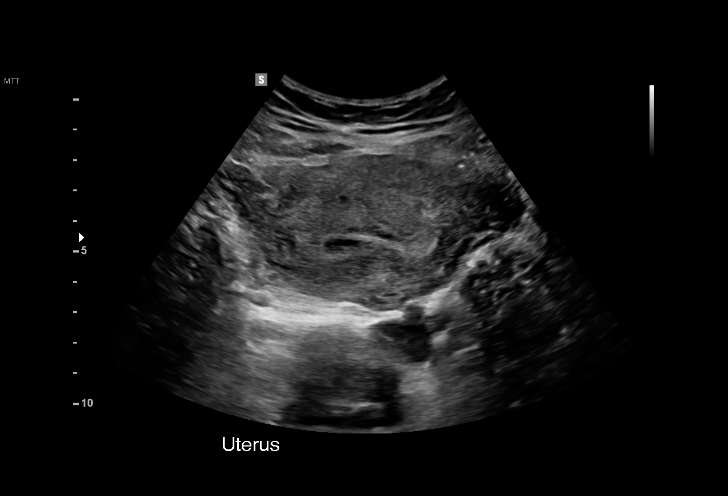
[im 32/62]
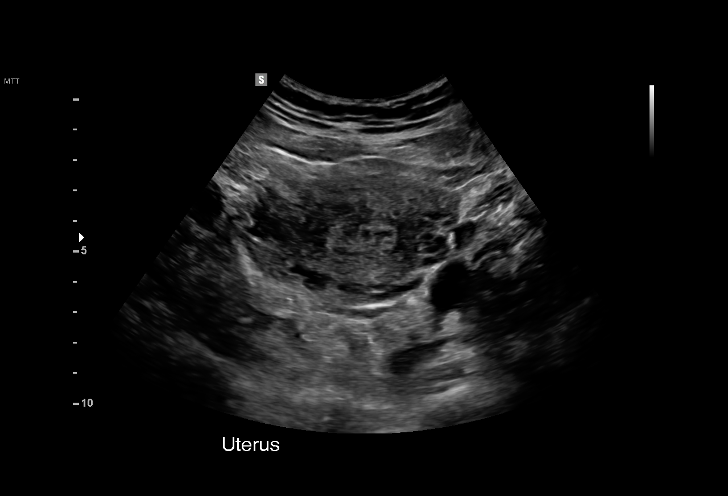
[im 34/62]
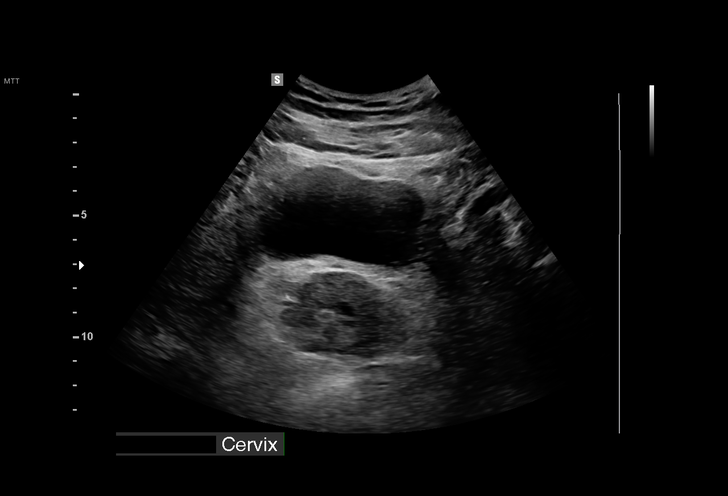
[im 39/62]
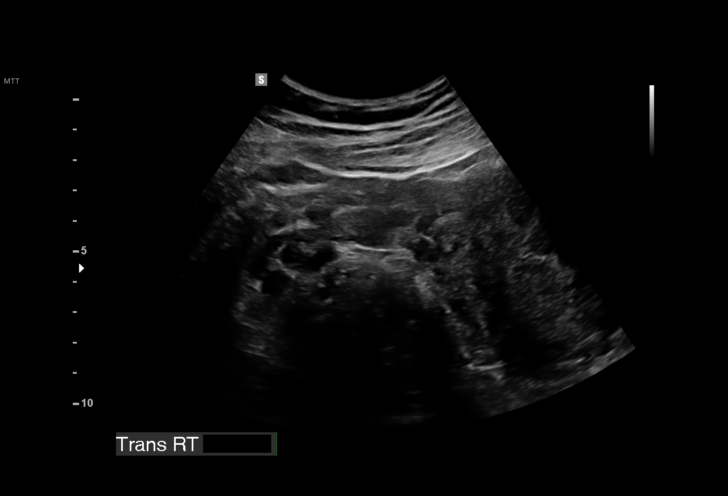
[im 43/62]
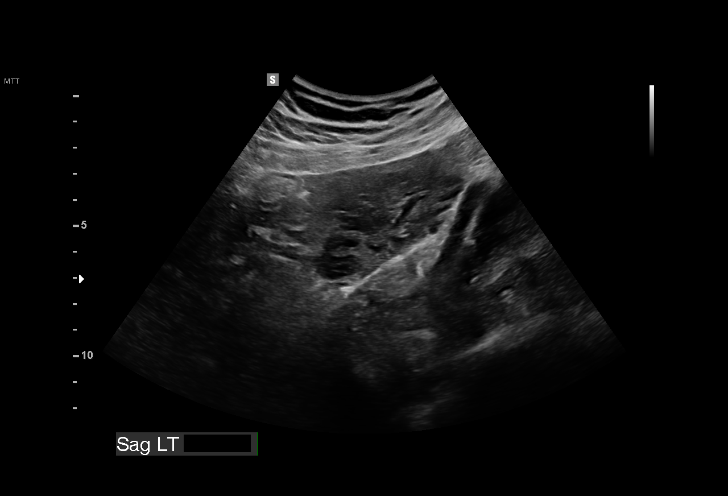
[im 48/62]
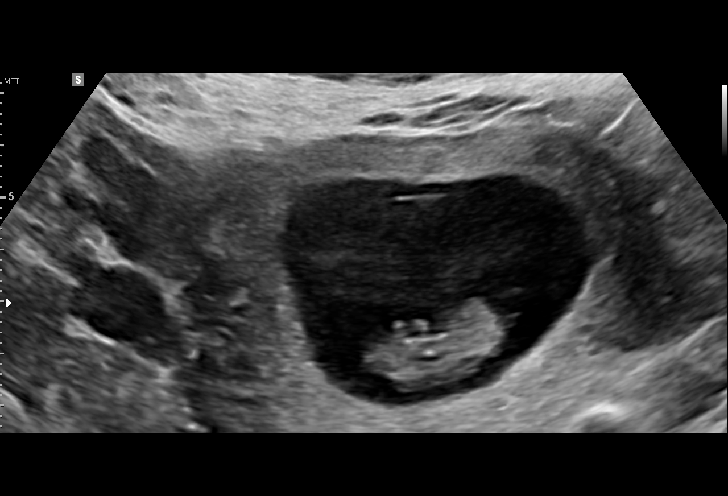
[im 52/62]
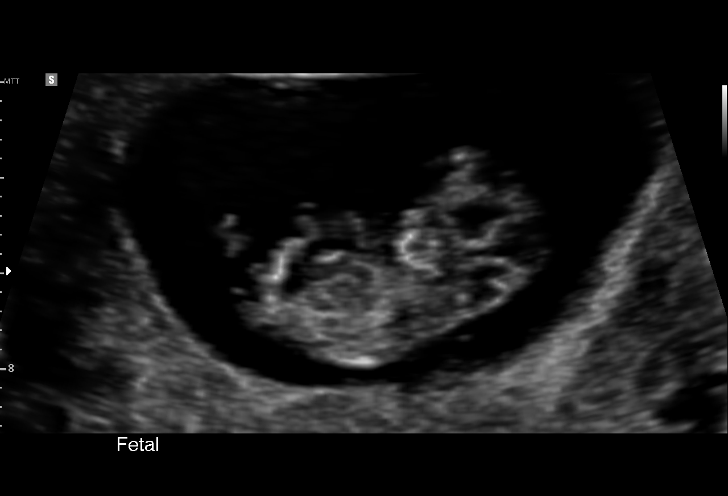
[im 57/62]
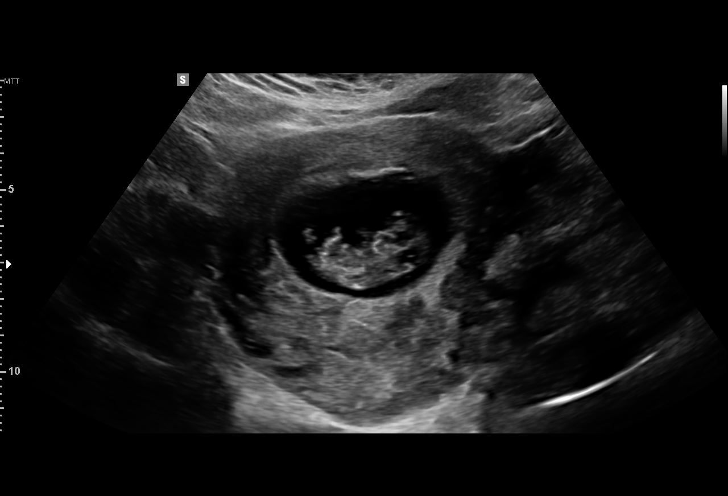
[im 62/62]
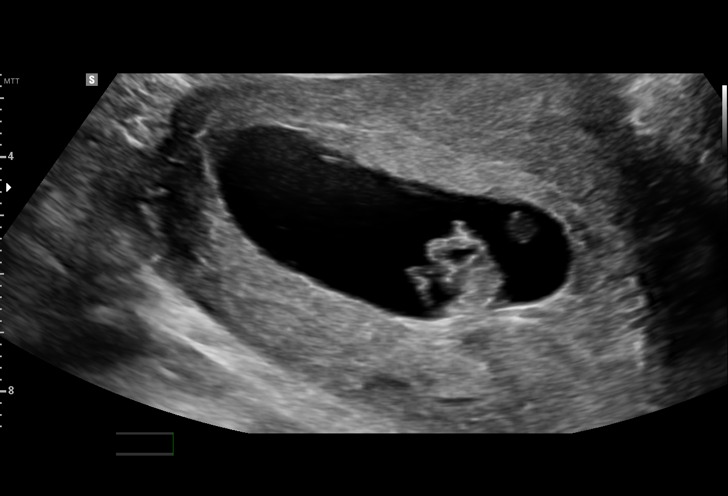

[15 of 28 positions shown; findings below may reference images not displayed]

FINDINGS: Intrauterine gestational sac: Single

Yolk sac:  Present

Embryo:  Present

Cardiac Activity: Present

Heart Rate: 161 bpm

CRL:   33.5  mm   10 w 2 d                  US EDC: 12/08/2016

Subchorionic hemorrhage:  None visualized.

Maternal uterus/adnexae: IUD not visualized.

Right ovary is within normal limits.

Left ovary is notable for a probable corpus luteal cyst.

No free fluid.
IMPRESSION: Single live intrauterine gestation, with estimated gestational age
10 weeks 2 days by crown-rump length.

No IUD is visualized.

## 2017-09-14 ENCOUNTER — Ambulatory Visit: Payer: Self-pay | Admitting: Family Medicine

## 2017-10-05 ENCOUNTER — Other Ambulatory Visit: Payer: Self-pay | Admitting: Family Medicine

## 2017-10-05 DIAGNOSIS — O10919 Unspecified pre-existing hypertension complicating pregnancy, unspecified trimester: Secondary | ICD-10-CM

## 2017-11-16 ENCOUNTER — Other Ambulatory Visit: Payer: Self-pay | Admitting: Family Medicine

## 2017-11-16 ENCOUNTER — Encounter: Payer: Self-pay | Admitting: Family Medicine

## 2017-11-16 ENCOUNTER — Ambulatory Visit: Payer: Self-pay | Attending: Family Medicine | Admitting: Family Medicine

## 2017-11-16 VITALS — BP 151/102 | HR 91 | Temp 98.0°F | Ht 67.0 in | Wt 220.4 lb

## 2017-11-16 DIAGNOSIS — M722 Plantar fascial fibromatosis: Secondary | ICD-10-CM | POA: Insufficient documentation

## 2017-11-16 DIAGNOSIS — Z79899 Other long term (current) drug therapy: Secondary | ICD-10-CM | POA: Insufficient documentation

## 2017-11-16 DIAGNOSIS — R51 Headache: Secondary | ICD-10-CM | POA: Insufficient documentation

## 2017-11-16 DIAGNOSIS — I10 Essential (primary) hypertension: Secondary | ICD-10-CM | POA: Insufficient documentation

## 2017-11-16 DIAGNOSIS — J039 Acute tonsillitis, unspecified: Secondary | ICD-10-CM

## 2017-11-16 DIAGNOSIS — R519 Headache, unspecified: Secondary | ICD-10-CM

## 2017-11-16 MED ORDER — LABETALOL HCL 200 MG PO TABS: ORAL_TABLET | ORAL | 6 refills | Status: DC

## 2017-11-16 MED ORDER — IBUPROFEN 600 MG PO TABS: 600.0000 mg | ORAL_TABLET | Freq: Three times a day (TID) | ORAL | 3 refills | Status: DC | PRN

## 2017-11-16 MED ORDER — BUTALBITAL-APAP-CAFFEINE 50-325-40 MG PO TABS: 1.0000 | ORAL_TABLET | Freq: Two times a day (BID) | ORAL | 2 refills | Status: DC | PRN

## 2017-11-16 NOTE — Patient Instructions (Signed)

## 2017-11-17 ENCOUNTER — Encounter: Payer: Self-pay | Admitting: Family Medicine

## 2017-11-17 LAB — BASIC METABOLIC PANEL
BUN/Creatinine Ratio: 28 — ABNORMAL HIGH (ref 9–23)
BUN: 12 mg/dL (ref 6–20)
CALCIUM: 9.2 mg/dL (ref 8.7–10.2)
CHLORIDE: 105 mmol/L (ref 96–106)
CO2: 24 mmol/L (ref 20–29)
CREATININE: 0.43 mg/dL — AB (ref 0.57–1.00)
GFR calc Af Amer: 148 mL/min/{1.73_m2} (ref >59)
GFR calc non Af Amer: 129 mL/min/{1.73_m2} (ref >59)
GLUCOSE: 91 mg/dL (ref 65–99)
Potassium: 4.2 mmol/L (ref 3.5–5.2)
Sodium: 143 mmol/L (ref 134–144)

## 2017-11-17 NOTE — Progress Notes (Signed)
Subjective:  Patient ID: Carolyn Ramsey, female    DOB: 07/04/1976  Age: 41 y.o. MRN: [id number]  CC: Hypertension   HPI Carolyn Ramsey is a 41 year old female with a history of Hypertension and headaches here for a follow up visit.  She endorses compliance with her antihypertensive and denies adverse effects. Her headaches occur about twice a week and are controlled on Fioricet.  She has moderate pain in her right heel and was seen by Orthopedics - Dr August Saucerean and the plan was for Cortisone injection which was placed on hold due to cultural pigmentations on her heel. She informs she also has intermittent knee pain worse with the cold weather.  Past Medical History:  Diagnosis Date  . Hypertension   . Spinal headache     Past Surgical History:  Procedure Laterality Date  . NO PAST SURGERIES      No Known Allergies   Outpatient Medications Prior to Visit  Medication Sig Dispense Refill  . butalbital-acetaminophen-caffeine (FIORICET, ESGIC) 50-325-40 MG tablet Take 1-2 tablets by mouth every 6 (six) hours as needed for headache. 30 tablet 2  . labetalol (NORMODYNE) 200 MG tablet TAKE ONE (1) TABLET BY MOUTH TWO (2) TIMES DAILY 60 tablet 0  . acetaminophen (TYLENOL) 500 MG tablet Take 500 mg by mouth every 6 (six) hours as needed.    Marland Kitchen. amoxicillin (AMOXIL) 500 MG capsule Take 1 capsule (500 mg total) by mouth 3 (three) times daily. (Patient not taking: Reported on 11/16/2017) 30 capsule 0  . cephALEXin (KEFLEX) 500 MG capsule Take 1 capsule (500 mg total) by mouth 2 (two) times daily. (Patient not taking: Reported on 11/16/2017) 20 capsule 0  . levonorgestrel (LILETTA, 52 MG,) 18.6 MCG/DAY IUD IUD 1 Intra Uterine Device (1 each total) by Intrauterine route once. 1 each 0  . cetirizine (ZYRTEC) 10 MG tablet Take 1 tablet (10 mg total) by mouth daily. (Patient not taking: Reported on 11/16/2017) 30 tablet 1  . ibuprofen (ADVIL,MOTRIN) 600 MG tablet Take 1 tablet (600 mg total) by  mouth every 6 (six) hours. (Patient not taking: Reported on 03/25/2017) 30 tablet 0   Facility-Administered Medications Prior to Visit  Medication Dose Route Frequency Provider Last Rate Last Dose  . nystatin (MYCOSTATIN/NYSTOP) topical powder   Topical BID Leftwich-Kirby, Lisa A, CNM        ROS Review of Systems  Constitutional: Negative for activity change, appetite change and fatigue.  HENT: Negative for congestion, sinus pressure and sore throat.   Eyes: Negative for visual disturbance.  Respiratory: Negative for cough, chest tightness, shortness of breath and wheezing.   Cardiovascular: Negative for chest pain and palpitations.  Gastrointestinal: Negative for abdominal distention, abdominal pain and constipation.  Endocrine: Negative for polydipsia.  Genitourinary: Negative for dysuria and frequency.  Musculoskeletal:       See hpi  Skin: Negative for rash.  Neurological: Negative for tremors, light-headedness and numbness.  Hematological: Does not bruise/bleed easily.  Psychiatric/Behavioral: Negative for agitation and behavioral problems.    Objective:  BP (!) 151/102   Pulse 91   Temp 98 F (36.7 C) (Oral)   Ht 5\' 7"  (1.702 m)   Wt 220 lb 6.4 oz (100 kg)   SpO2 98%   BMI 34.52 kg/m   BP/Weight 11/16/2017 04/20/2017 03/25/2017  Systolic BP 151 151 147  Diastolic BP 102 108 94  Wt. (Lbs) 220.4 218.4 222.2  BMI 34.52 34.21 34.8      Physical Exam  Constitutional: She  is oriented to person, place, and time. She appears well-developed and well-nourished.  Cardiovascular: Normal rate, normal heart sounds and intact distal pulses.  No murmur heard. Pulmonary/Chest: Effort normal and breath sounds normal. She has no wheezes. She has no rales. She exhibits no tenderness.  Abdominal: Soft. Bowel sounds are normal. She exhibits no distension and no mass. There is no tenderness.  Musculoskeletal: Normal range of motion. She exhibits no edema (no edema of knees) or  tenderness (of knees but presence of crepitus).  TTP of R heel  Neurological: She is alert and oriented to person, place, and time.     Assessment & Plan:   1. Nonintractable episodic headache, unspecified headache type Controlled - butalbital-acetaminophen-caffeine (FIORICET, ESGIC) 50-325-40 MG tablet; Take 1 tablet by mouth every 12 (twelve) hours as needed for headache.  Dispense: 30 tablet; Refill: 2  2. Essential hypertension Elevated above goal Low sodium, DASH diet If still elevated above goal of <130/80 at next visit I will adjust her regimen. - Basic Metabolic Panel - labetalol (NORMODYNE) 200 MG tablet; TAKE ONE (1) TABLET BY MOUTH TWO (2) TIMES DAILY  Dispense: 60 tablet; Refill: 6  3. Plantar fasciitis of right foot Advised to reschedule appointment with Dr August Saucerean for corticosteroid injections Use insoles - ibuprofen (ADVIL,MOTRIN) 600 MG tablet; Take 1 tablet (600 mg total) by mouth every 8 (eight) hours as needed.  Dispense: 60 tablet; Refill: 3   Meds ordered this encounter  Medications  . butalbital-acetaminophen-caffeine (FIORICET, ESGIC) 50-325-40 MG tablet    Sig: Take 1 tablet by mouth every 12 (twelve) hours as needed for headache.    Dispense:  30 tablet    Refill:  2  . labetalol (NORMODYNE) 200 MG tablet    Sig: TAKE ONE (1) TABLET BY MOUTH TWO (2) TIMES DAILY    Dispense:  60 tablet    Refill:  6    Must have office visit for refills  . ibuprofen (ADVIL,MOTRIN) 600 MG tablet    Sig: Take 1 tablet (600 mg total) by mouth every 8 (eight) hours as needed.    Dispense:  60 tablet    Refill:  3    Follow-up: No Follow-up on file.   Jaclyn ShaggyEnobong Amao MD

## 2017-12-14 ENCOUNTER — Other Ambulatory Visit: Payer: Self-pay

## 2017-12-14 ENCOUNTER — Ambulatory Visit (HOSPITAL_COMMUNITY)
Admission: EM | Admit: 2017-12-14 | Discharge: 2017-12-14 | Disposition: A | Discharge status: Home / Self Care | Payer: BLUE CROSS/BLUE SHIELD | Attending: Family Medicine | Admitting: Family Medicine

## 2017-12-14 ENCOUNTER — Encounter (HOSPITAL_COMMUNITY): Payer: Self-pay | Admitting: Emergency Medicine

## 2017-12-14 DIAGNOSIS — R51 Headache: Secondary | ICD-10-CM

## 2017-12-14 DIAGNOSIS — R519 Headache, unspecified: Secondary | ICD-10-CM

## 2017-12-14 MED ORDER — KETOROLAC TROMETHAMINE 60 MG/2ML IM SOLN
60.0000 mg | Freq: Once | INTRAMUSCULAR | Status: AC
  Administered 2017-12-14: 60 mg via INTRAMUSCULAR

## 2017-12-14 MED ORDER — DEXAMETHASONE SODIUM PHOSPHATE 10 MG/ML IJ SOLN
10.0000 mg | Freq: Once | INTRAMUSCULAR | Status: AC
  Administered 2017-12-14: 10 mg via INTRAMUSCULAR

## 2017-12-14 MED ORDER — KETOROLAC TROMETHAMINE 60 MG/2ML IM SOLN
INTRAMUSCULAR | Status: AC
  Filled 2017-12-14: qty 2

## 2017-12-14 MED ORDER — DEXAMETHASONE SODIUM PHOSPHATE 10 MG/ML IJ SOLN
INTRAMUSCULAR | Status: AC
  Filled 2017-12-14: qty 1

## 2017-12-14 MED ORDER — FLUTICASONE PROPIONATE 50 MCG/ACT NA SUSP: 2.0000 | Freq: Every day | NASAL | 0 refills | Status: DC

## 2017-12-14 NOTE — Discharge Instructions (Signed)
Toradol and decadron injection in office today to help with headache. You can continue ibuprofen 600mg , take three times a day to help with the neck pain, that could be causing your headache. Warm compresses of the neck to help with neck pain. Start flonase for your frontal headache to relieve pressure of your sinuses. If experiencing worsening symptoms, sensitivity to light, blurry vision, dizziness, one sided weakness/numbness, confusion/altered mental status, worse headache of your life, go to the emergency department for further evaluation.

## 2017-12-14 NOTE — ED Triage Notes (Signed)
Headache started Saturday.  Pain on both sides of head and the back of head

## 2017-12-14 NOTE — ED Provider Notes (Signed)
[facility]    CSN: [id number] Arrival date & time: 12/14/17  1223     History   Chief Complaint Chief Complaint  Patient presents with  . Headache    HPI Carolyn Ramsey is a 42 y.o. female.   42 year old female comes in for 3-day history of headache.  HPI given by patient through video translator.  States first started as frontal/temporal headache, but now also feeling occipital headache.  She describes it as "burning sensation".  Has been taking Tylenol and ibuprofen with good relief, but headache returns. Has not noticed anything making pain worse.  Denies photophobia, phonophobia.  Denies nausea, vomiting.  Denies weakness, dizziness, confusion.  Denies URI symptoms such as cough, congestion, sore throat.  Denies fever, chills, night sweats. Denies recent travels, changes in altitude.       Past Medical History:  Diagnosis Date  . Hypertension   . Spinal headache     Patient Active Problem List   Diagnosis Date Noted  . Language barrier 08/06/2016  . Essential hypertension 11/14/2015  . 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 NO PAST SURGERIES      OB History    Gravida Para Term Preterm AB Living   5 5 5  0 0 5   SAB TAB Ectopic Multiple Live Births   0 0 0 0 2       Home Medications    Prior to Admission medications   Medication Sig Start Date End Date Taking? Authorizing Provider  acetaminophen (TYLENOL) 500 MG tablet Take 500 mg by mouth every 6 (six) hours as needed.   Yes [provider]  ibuprofen (ADVIL,MOTRIN) 600 MG tablet Take 1 tablet (600 mg total) by mouth every 8 (eight) hours as needed. 11/16/17  Yes Amao, Odette Horns, MD  labetalol (NORMODYNE) 200 MG tablet TAKE ONE (1) TABLET BY MOUTH TWO (2) TIMES DAILY 11/16/17  Yes Jaclyn Shaggy, MD  amoxicillin (AMOXIL) 500 MG capsule Take 1 capsule (500 mg total) by mouth 3 (three) times daily. Patient not taking: Reported on  11/16/2017 03/25/17   Jaclyn Shaggy, MD  butalbital-acetaminophen-caffeine (FIORICET, ESGIC) 320-285-4582 MG tablet Take 1 tablet by mouth every 12 (twelve) hours as needed for headache. 11/16/17 11/16/18  Jaclyn Shaggy, MD  cephALEXin (KEFLEX) 500 MG capsule Take 1 capsule (500 mg total) by mouth 2 (two) times daily. Patient not taking: Reported on 11/16/2017 04/20/17   Jaclyn Shaggy, MD  cetirizine (ZYRTEC) 10 MG tablet TAKE ONE (1) TABLET BY MOUTH EVERY DAY 11/16/17   Jaclyn Shaggy, MD  fluticasone (FLONASE) 50 MCG/ACT nasal spray Place 2 sprays into both nostrils daily. 12/14/17   Cathie Hoops, Nayra Coury V, PA-C  levonorgestrel (LILETTA, 52 MG,) 18.6 MCG/DAY IUD IUD 1 Intra Uterine Device (1 each total) by Intrauterine route once. 02/09/17 02/09/17  Leftwich-Kirby, Wilmer Floor, CNM    Family History Family History  Family history unknown: Yes    Social History Social History   Tobacco Use  . Smoking status: Never Smoker  . Smokeless tobacco: Never Used  Substance Use Topics  . Alcohol use: No    Alcohol/week: 0.0 oz  . Drug use: No     Allergies   Patient has no known allergies.   Review of Systems Review of Systems  Reason unable to perform ROS: See HPI as above.     Physical Exam Triage Vital Signs ED Triage Vitals  Enc Vitals Group     BP 12/14/17 1320 Marland Kitchen)  153/92     Pulse Rate 12/14/17 1320 95     Resp 12/14/17 1320 20     Temp 12/14/17 1320 98.2 F (36.8 C)     Temp Source 12/14/17 1320 Oral     SpO2 12/14/17 1320 100 %     Weight --      Height --      Head Circumference --      Peak Flow --      Pain Score 12/14/17 1317 10     Pain Loc --      Pain Edu? --      Excl. in GC? --    No data found.  Updated Vital Signs BP (!) 153/92 (BP Location: Right Arm)   Pulse 95   Temp 98.2 F (36.8 C) (Oral)   Resp 20   LMP 12/01/2017   SpO2 100%   Physical Exam  Constitutional: She is oriented to person, place, and time. She appears well-developed and well-nourished. No  distress.  HENT:  Head: Normocephalic and atraumatic.  Right Ear: External ear and ear canal normal. Tympanic membrane is retracted. Tympanic membrane is not erythematous and not bulging.  Left Ear: External ear and ear canal normal. Tympanic membrane is retracted. Tympanic membrane is not erythematous and not bulging.  Nose: Right sinus exhibits frontal sinus tenderness. Right sinus exhibits no maxillary sinus tenderness. Left sinus exhibits frontal sinus tenderness. Left sinus exhibits no maxillary sinus tenderness.  Mouth/Throat: Uvula is midline, oropharynx is clear and moist and mucous membranes are normal.  Eyes: Conjunctivae, EOM and lids are normal. Pupils are equal, round, and reactive to light.  Neck: Normal range of motion. Neck supple. Muscular tenderness (bilateral) present. No spinous process tenderness present. Normal range of motion present.  Cardiovascular: Normal rate, regular rhythm and normal heart sounds. Exam reveals no gallop and no friction rub.  No murmur heard. Pulmonary/Chest: Effort normal and breath sounds normal. She has no decreased breath sounds. She has no wheezes. She has no rhonchi. She has no rales.  Musculoskeletal:  Tenderness on palpation of bilateral trapezius muscle. No bony tenderness. Full ROM of shoulder. Strength normal and equal bilaterally. Sensation intact and equal bilaterally.   Lymphadenopathy:    She has no cervical adenopathy.  Neurological: She is alert and oriented to person, place, and time. She has normal strength. She is not disoriented. No cranial nerve deficit or sensory deficit. Coordination and gait normal. GCS eye subscore is 4. GCS verbal subscore is 5. GCS motor subscore is 6.  Skin: Skin is warm and dry.  Psychiatric: She has a normal mood and affect. Her behavior is normal. Judgment normal.     UC Treatments / Results  Labs (all labs ordered are listed, but only abnormal results are displayed) Labs Reviewed - No data to  display  EKG  EKG Interpretation None       Radiology No results found.  Procedures Procedures (including critical care time)  Medications Ordered in UC Medications  ketorolac (TORADOL) injection 60 mg (60 mg Intramuscular Given 12/14/17 1413)  dexamethasone (DECADRON) injection 10 mg (10 mg Intramuscular Given 12/14/17 1412)     Initial Impression / Assessment and Plan / UC Course  I have reviewed the triage vital signs and the nursing notes.  Pertinent labs & imaging results that were available during my care of the patient were reviewed by me and considered in my medical decision making (see chart for details).    No alarming signs on  exam.  Toradol and Decadron injection in office for headache.  Patient can continue ibuprofen as needed.  Discussed with patient neck soreness/pain could be causing the headache.  Exam of the ear showed retracted TM, which could be contributing to frontal headache.  Start Flonase as directed.  Return precautions given.  Patient expresses understanding and agrees to plan.  Final Clinical Impressions(s) / UC Diagnoses   Final diagnoses:  Acute intractable headache, unspecified headache type    ED Discharge Orders        Ordered    fluticasone (FLONASE) 50 MCG/ACT nasal spray  Daily     12/14/17 1401        Belinda Fisher, [place] 12/14/17 1451

## 2017-12-27 ENCOUNTER — Encounter: Payer: Self-pay | Admitting: Family Medicine

## 2017-12-27 ENCOUNTER — Ambulatory Visit: Payer: BLUE CROSS/BLUE SHIELD | Attending: Family Medicine | Admitting: Family Medicine

## 2017-12-27 VITALS — BP 148/99 | HR 90 | Temp 98.1°F | Ht 67.0 in | Wt 219.6 lb

## 2017-12-27 DIAGNOSIS — I10 Essential (primary) hypertension: Secondary | ICD-10-CM | POA: Diagnosis not present

## 2017-12-27 DIAGNOSIS — R51 Headache: Secondary | ICD-10-CM | POA: Diagnosis not present

## 2017-12-27 DIAGNOSIS — R519 Headache, unspecified: Secondary | ICD-10-CM

## 2017-12-27 DIAGNOSIS — Z975 Presence of (intrauterine) contraceptive device: Secondary | ICD-10-CM | POA: Diagnosis not present

## 2017-12-27 DIAGNOSIS — Z79899 Other long term (current) drug therapy: Secondary | ICD-10-CM | POA: Insufficient documentation

## 2017-12-27 DIAGNOSIS — M542 Cervicalgia: Secondary | ICD-10-CM

## 2017-12-27 MED ORDER — BUTALBITAL-APAP-CAFFEINE 50-325-40 MG PO TABS: 1.0000 | ORAL_TABLET | Freq: Two times a day (BID) | ORAL | 2 refills | Status: DC | PRN

## 2017-12-27 MED ORDER — LABETALOL HCL 200 MG PO TABS: ORAL_TABLET | ORAL | 6 refills | Status: DC

## 2017-12-27 MED ORDER — METHOCARBAMOL 750 MG PO TABS: 750.0000 mg | ORAL_TABLET | Freq: Two times a day (BID) | ORAL | 1 refills | Status: DC | PRN

## 2017-12-27 NOTE — Patient Instructions (Signed)

## 2017-12-27 NOTE — Progress Notes (Signed)
Subjective:  Patient ID: Carolyn Ramsey, female    DOB: Mar 30, 1976  Age: 42 y.o. MRN: [id number]  CC: Headache   HPI Carolyn Ramsey is a 42 year old female with a history of Hypertension and headaches here for a follow up visit.  She was seen at the emergency room 2 weeks ago for headache and was treated with Toradol, Decadron with resulting improvement in symptoms. She suffers from chronic headaches and has been on Fioricet as needed with control of her headache until recently.  Headache occurs on the central aspect of her head, radiates down her neck to poor bilateral neck muscles with associated production of "heat sensation" as per patient. She informs me today she does not have any headache medication. Denies blurry vision, nausea or vomiting and headaches occur about 2 times a week and absent at this time.  She denies sinus symptoms, postnasal drip or fever. 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  Past Medical History:  Diagnosis Date  . Hypertension   . Spinal headache     Past Surgical History:  Procedure Laterality Date  . NO PAST SURGERIES      No Known Allergies   Outpatient Medications Prior to Visit  Medication Sig Dispense Refill  . ibuprofen (ADVIL,MOTRIN) 600 MG tablet Take 1 tablet (600 mg total) by mouth every 8 (eight) hours as needed. 60 tablet 3  . labetalol (NORMODYNE) 200 MG tablet TAKE ONE (1) TABLET BY MOUTH TWO (2) TIMES DAILY 60 tablet 6  . acetaminophen (TYLENOL) 500 MG tablet Take 500 mg by mouth every 6 (six) hours as needed.    Marland Kitchen amoxicillin (AMOXIL) 500 MG capsule Take 1 capsule (500 mg total) by mouth 3 (three) times daily. (Patient not taking: Reported on 11/16/2017) 30 capsule 0  . cephALEXin (KEFLEX) 500 MG capsule Take 1 capsule (500 mg total) by mouth 2 (two) times daily. (Patient not taking: Reported on 11/16/2017) 20 capsule 0  . cetirizine (ZYRTEC) 10 MG tablet TAKE ONE (1) TABLET BY  MOUTH EVERY DAY (Patient not taking: Reported on 12/27/2017) 30 tablet 0  . fluticasone (FLONASE) 50 MCG/ACT nasal spray Place 2 sprays into both nostrils daily. (Patient not taking: Reported on 12/27/2017) 1 g 0  . levonorgestrel (LILETTA, 52 MG,) 18.6 MCG/DAY IUD IUD 1 Intra Uterine Device (1 each total) by Intrauterine route once. 1 each 0  . butalbital-acetaminophen-caffeine (FIORICET, ESGIC) 50-325-40 MG tablet Take 1 tablet by mouth every 12 (twelve) hours as needed for headache. (Patient not taking: Reported on 12/27/2017) 30 tablet 2   Facility-Administered Medications Prior to Visit  Medication Dose Route Frequency Provider Last Rate Last Dose  . nystatin (MYCOSTATIN/NYSTOP) topical powder   Topical BID Leftwich-Kirby, Lisa A, CNM        ROS Review of Systems  Constitutional: Negative for activity change, appetite change and fatigue.  HENT: Negative for congestion, sinus pressure and sore throat.   Eyes: Negative for visual disturbance.  Respiratory: Negative for cough, chest tightness, shortness of breath and wheezing.   Cardiovascular: Negative for chest pain and palpitations.  Gastrointestinal: Negative for abdominal distention, abdominal pain and constipation.  Endocrine: Negative for polydipsia.  Genitourinary: Negative for dysuria and frequency.  Musculoskeletal: Positive for neck pain. Negative for arthralgias and back pain.  Skin: Negative for rash.  Neurological: Positive for headaches. Negative for tremors, light-headedness and numbness.  Hematological: Does not bruise/bleed easily.  Psychiatric/Behavioral: Negative for agitation and behavioral problems.  Objective:  BP (!) 148/99   Pulse 90   Temp 98.1 F (36.7 C) (Oral)   Ht 5\' 7"  (1.702 m)   Wt 219 lb 9.6 oz (99.6 kg)   LMP 12/01/2017   SpO2 100%   BMI 34.39 kg/m   BP/Weight 12/27/2017 12/14/2017 11/16/2017  Systolic BP 148 153 151  Diastolic BP 99 92 102  Wt. (Lbs) 219.6 - 220.4  BMI 34.39 - 34.52       Physical Exam  Constitutional: She is oriented to person, place, and time. She appears well-developed and well-nourished.  HENT:  Normal oropharynx, no sinus tenderness to palpation  Neck:  Tenderness on palpation of posterior aspect of neck and bilateral sternocleidomastoid muscles  Cardiovascular: Normal rate, normal heart sounds and intact distal pulses.  No murmur heard. Pulmonary/Chest: Effort normal and breath sounds normal. She has no wheezes. She has no rales. She exhibits no tenderness.  Abdominal: Soft. Bowel sounds are normal. She exhibits no distension and no mass. There is no tenderness.  Musculoskeletal: Normal range of motion.  Neurological: She is alert and oriented to person, place, and time.  Skin: Skin is warm and dry.  Psychiatric: She has a normal mood and affect.     Assessment & Plan:   1. Nonintractable episodic headache, unspecified headache type Uncontrolled due to being out of Fioricet We will consider placing her on Topamax for prophylaxis however I am concerned about conception near future as she currently has a 40-year-old which she conceived while on an IUD .She informs me she has an IUD in place at this time. - butalbital-acetaminophen-caffeine (FIORICET, ESGIC) 50-325-40 MG tablet; Take 1 tablet by mouth every 12 (twelve) hours as needed for headache.  Dispense: 30 tablet; Refill: 2  2. Essential hypertension Elevated above goal Counseled on blood pressure goal of less than 130/80, low-sodium, DASH diet, medication compliance, 150 minutes of moderate intensity exercise per week. Discussed medication compliance, adverse effects. - labetalol (NORMODYNE) 200 MG tablet; TAKE ONE (1) TABLET BY MOUTH TWO (2) TIMES DAILY  Dispense: 60 tablet; Refill: 6  3. Neck pain Advised to apply heat - methocarbamol (ROBAXIN) 750 MG tablet; Take 1 tablet (750 mg total) by mouth 2 (two) times daily as needed for muscle spasms.  Dispense: 60 tablet; Refill:  1   Meds ordered this encounter  Medications  . butalbital-acetaminophen-caffeine (FIORICET, ESGIC) 50-325-40 MG tablet    Sig: Take 1 tablet by mouth every 12 (twelve) hours as needed for headache.    Dispense:  30 tablet    Refill:  2  . methocarbamol (ROBAXIN) 750 MG tablet    Sig: Take 1 tablet (750 mg total) by mouth 2 (two) times daily as needed for muscle spasms.    Dispense:  60 tablet    Refill:  1  . labetalol (NORMODYNE) 200 MG tablet    Sig: TAKE ONE (1) TABLET BY MOUTH TWO (2) TIMES DAILY    Dispense:  60 tablet    Refill:  6    Follow-up: Return in about 3 months (around 03/27/2018) for Follow-up of headache and hypertension.   Hoy RegisterEnobong Nakeem Murnane MD

## 2017-12-27 NOTE — Progress Notes (Signed)
Patient is having pain in her neck with a burning sensation.

## 2018-02-10 IMAGING — US US MFM FETAL BPP W/O NON-STRESS
1 series · 15 of 28 positions shown · non-contrast
Comparison: none

[Series 1: us mfm fetal bpp w/o non-stress · 43 acquisitions, 15 frames shown]
[im 1/43]
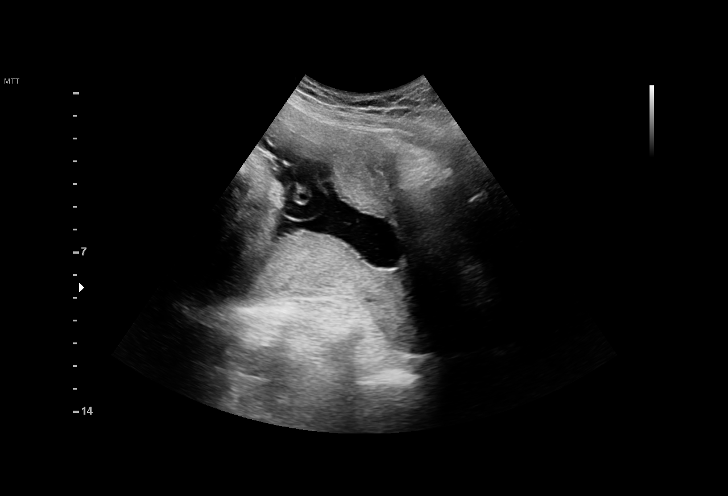
[im 4/43]
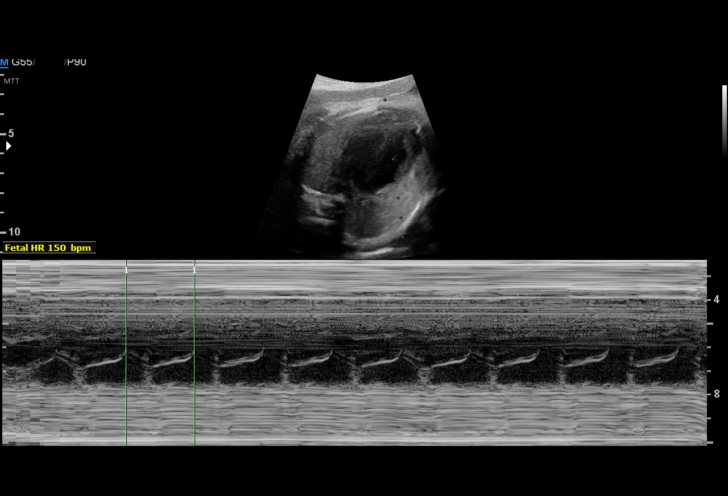
[im 7/43]
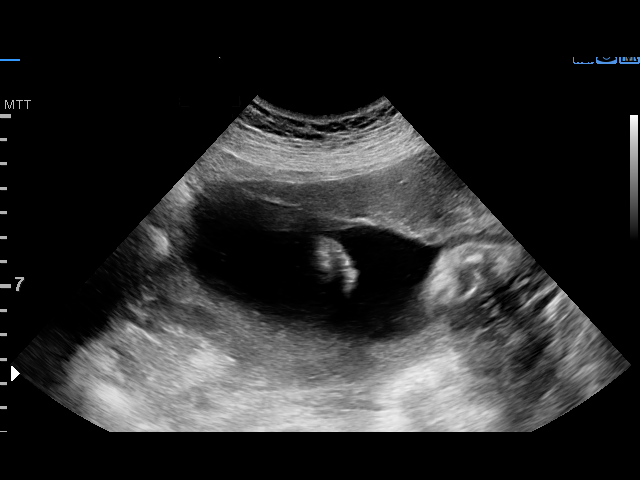
[im 10/43]
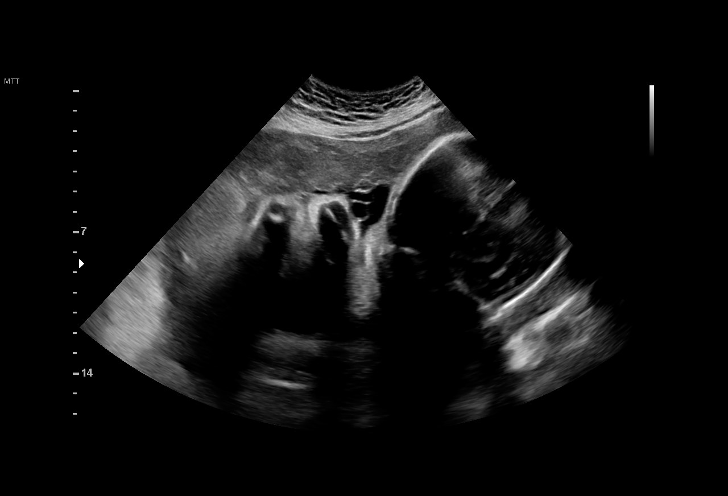
[im 13/43]
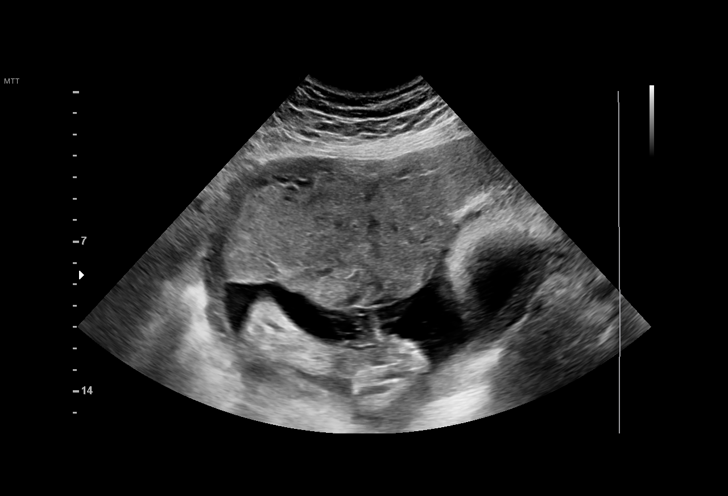
[im 16/43]
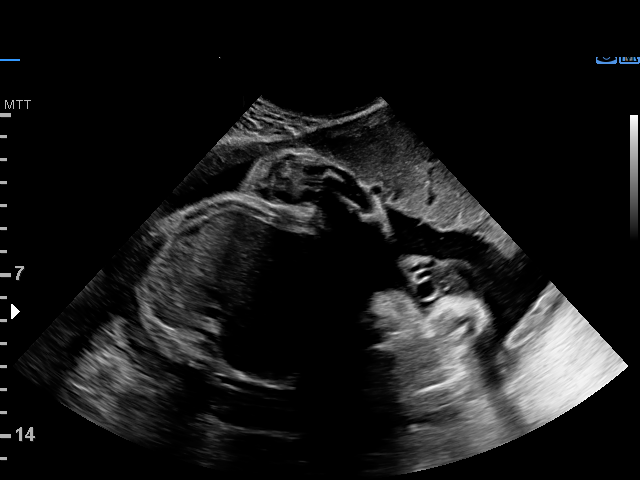
[im 19/43]
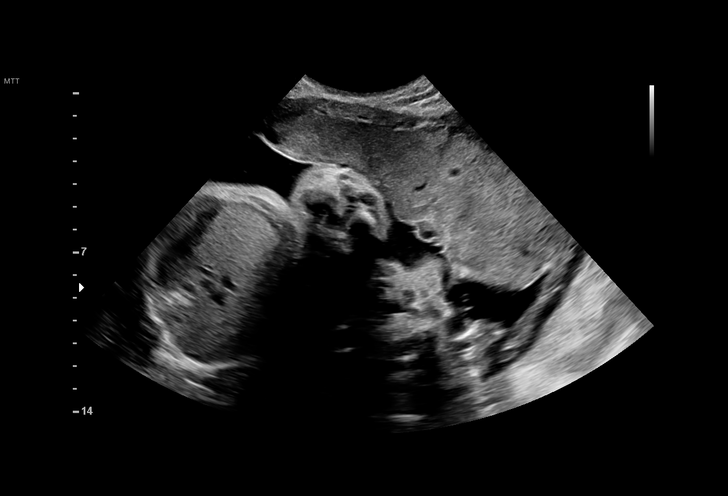
[im 22/43]
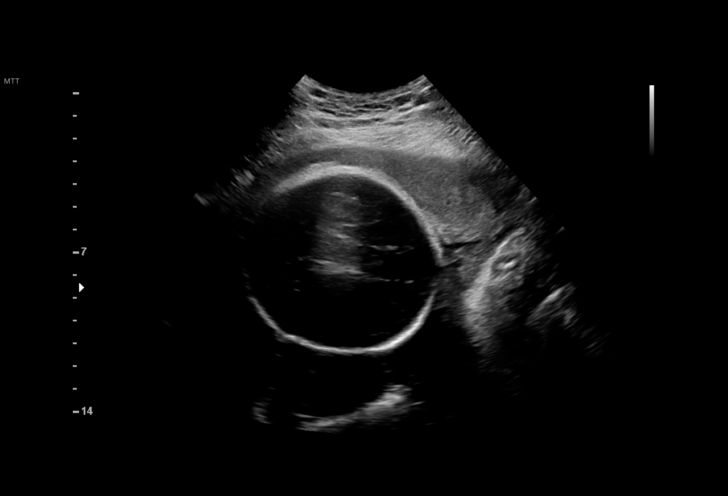
[im 24/43]
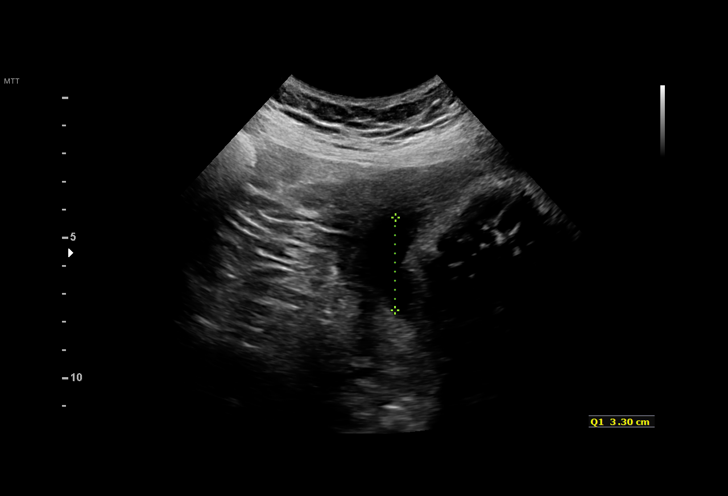
[im 27/43]
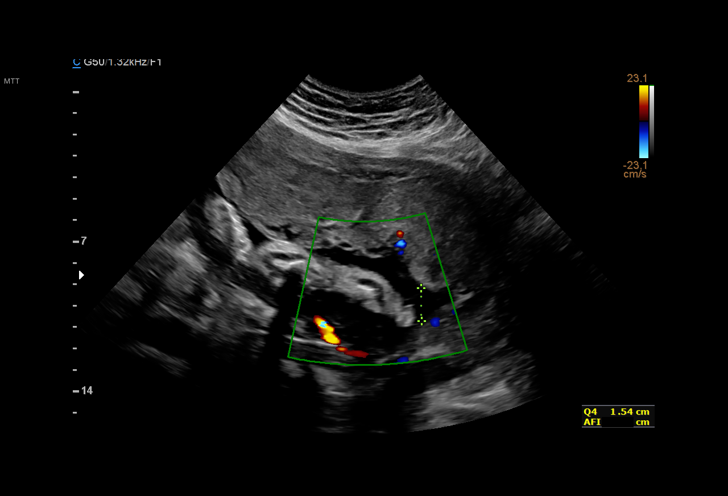
[im 30/43]
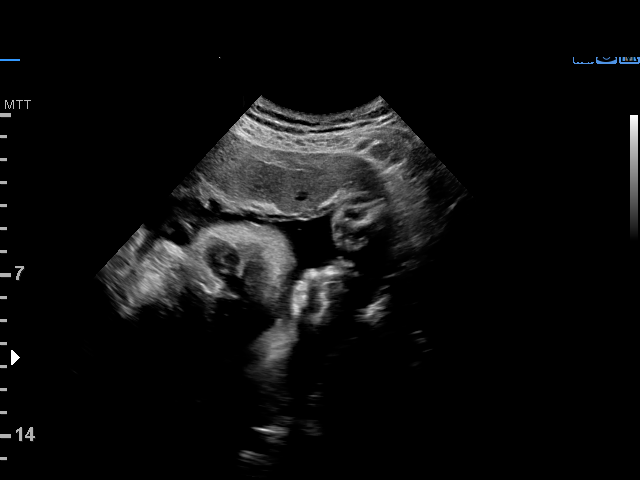
[im 33/43]
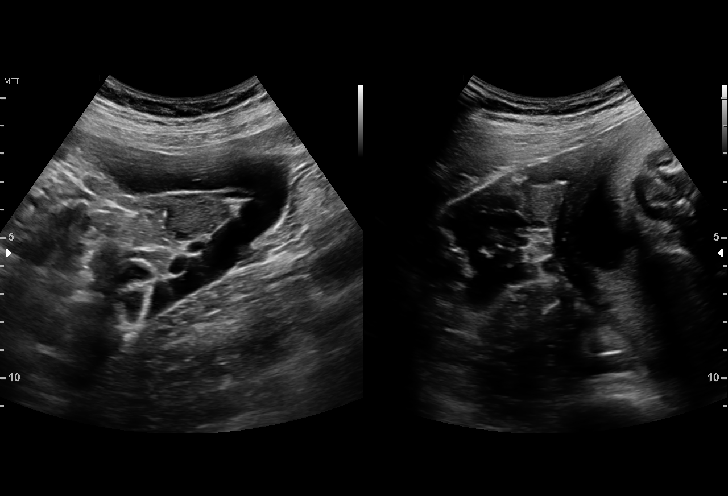
[im 36/43]
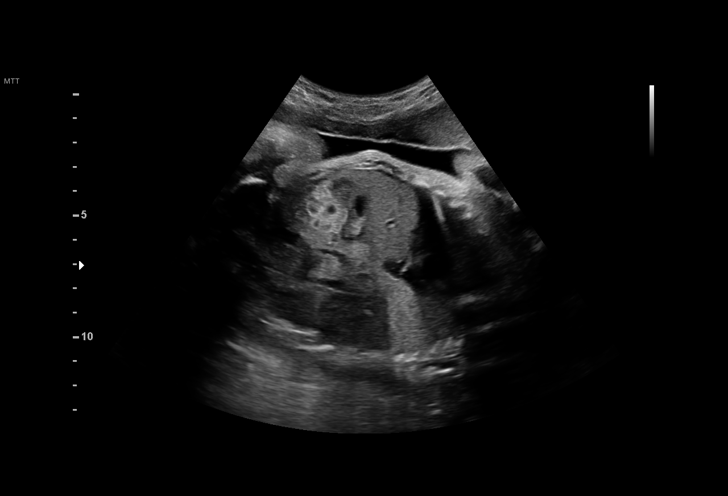
[im 39/43]
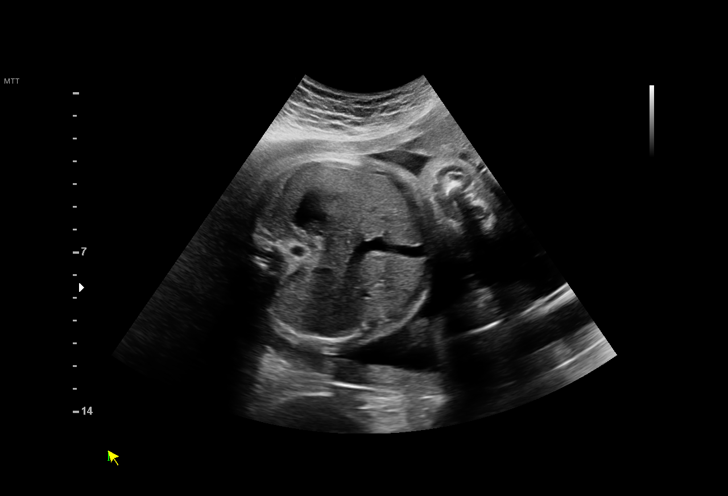
[im 43/43]
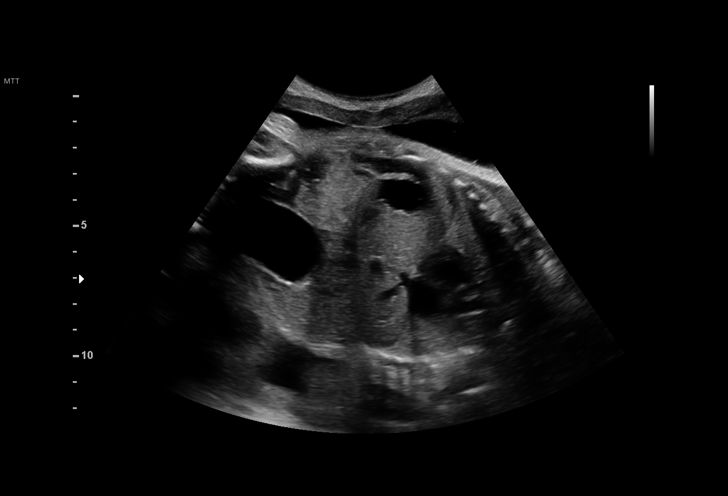

[15 of 28 positions shown; findings below may reference images not displayed]

[facility]
[REDACTED]
Secondary Phy.:   [HOSPITAL]

1  SAVANA OUEDRAOGO           [id number]      [phone]     [phone]
Indications

32 weeks gestation of pregnancy
Advanced maternal age multigravida 35+,
second trimester (declined testing)
Hypertension - Chronic/Pre-existing (on
labetalol)
Obesity complicating pregnancy, second
trimester
OB History

Blood Type:            Height:  5'6"   Weight (lb):  228       BMI:
Gravidity:    5         Term:   4        Prem:   0        SAB:   0
TOP:          0       Ectopic:  0        Living: 4
Fetal Evaluation

Num Of Fetuses:     1
Fetal Heart         150
Rate(bpm):
Cardiac Activity:   Observed
Presentation:       Cephalic
Placenta:           Anterior, above cervical os
P. Cord Insertion:  Previously Visualized

Amniotic Fluid
AFI FV:      Subjectively within normal limits

AFI Sum(cm)     %Tile       Largest Pocket(cm)
12.15           33

RUQ(cm)       RLQ(cm)       LUQ(cm)        LLQ(cm)
3.3
Biophysical Evaluation

Amniotic F.V:   Within normal limits       F. Tone:        Observed
F. Movement:    Observed                   Score:          [DATE]
F. Breathing:   Observed
Gestational Age

LMP:           31w 1d        Date:  03/11/16                 EDD:   12/16/16
Best:          32w 2d     Det. By:  Early Ultrasound         EDD:   12/08/16
(05/14/16)
Cervix Uterus Adnexa

Cervix
Not visualized (advanced GA >54wks)

Uterus
No abnormality visualized.

Left Ovary
Size(cm)     2.03   x   2.75   x  2.14      Vol(ml):
Within normal limits. No adnexal mass visualized.

Right Ovary
Size(cm)     2.21   x   1.42   x  1.52      Vol(ml):
Within normal limits. No adnexal mass visualized.

Cul De Sac:   No free fluid seen.

Adnexa:       No abnormality visualized.
Impression

IUP at 32+2 weeks with chronic hypertension and
nonreactive NST
Normal fetal movement and cardiac activity
AFI normal at 12.2 cm
BPP [DATE]
Recommendations

Reassuring BPP. Continue current treatment plan

## 2018-02-11 ENCOUNTER — Ambulatory Visit (HOSPITAL_COMMUNITY)
Admission: EM | Admit: 2018-02-11 | Discharge: 2018-02-11 | Disposition: A | Discharge status: Home / Self Care | Payer: BLUE CROSS/BLUE SHIELD | Attending: Emergency Medicine | Admitting: Emergency Medicine

## 2018-02-11 ENCOUNTER — Encounter (HOSPITAL_COMMUNITY): Payer: Self-pay | Admitting: Emergency Medicine

## 2018-02-11 DIAGNOSIS — M722 Plantar fascial fibromatosis: Secondary | ICD-10-CM | POA: Diagnosis not present

## 2018-02-11 DIAGNOSIS — J069 Acute upper respiratory infection, unspecified: Secondary | ICD-10-CM

## 2018-02-11 MED ORDER — IBUPROFEN 600 MG PO TABS: 600.0000 mg | ORAL_TABLET | Freq: Three times a day (TID) | ORAL | 0 refills | Status: DC | PRN

## 2018-02-11 NOTE — ED Triage Notes (Signed)
Pt c/o coughing and sore throat x3 days.

## 2018-02-11 NOTE — ED Provider Notes (Signed)
HPI  SUBJECTIVE:  Carolyn Ramsey is a 42 y.o. female who presents with sore throat, cough for the past 3 days.  Patient states that she feels feverish but did not take her temperature.  She reports headaches, body aches, nasal congestion, rhinorrhea, postnasal drip.  She tried ibuprofen 200 mg for headache with improvement in her symptoms.  No aggravating factors.  No wheezing, chest pain, shortness of breath.  Patient is sleeping well at night.  No allergy symptoms.  No abdominal pain, rash.  No sensation of throat swelling shut, difficulty breathing, no voice changes, drooling, trismus.  She took ibuprofen within 6-8 hours prior to evaluation.  No antibiotics in the past month.  She has multiple family members with identical symptoms.  Past medical history negative for diabetes, allergies.  She has a past medical history of hypertension.  LMP: Yesterday.  PMD: Hoy RegisterNewlin, Enobong, MD.  All history obtained through son acting as Nurse, learning disabilitytranslator.    Past Medical History:  Diagnosis Date  . Hypertension   . Spinal headache     Past Surgical History:  Procedure Laterality Date  . NO PAST SURGERIES      Family History  Family history unknown: Yes    Social History   Tobacco Use  . Smoking status: Never Smoker  . Smokeless tobacco: Never Used  Substance Use Topics  . Alcohol use: No    Alcohol/week: 0.0 oz  . Drug use: No    No current facility-administered medications for this encounter.   Current Outpatient Medications:  .  butalbital-acetaminophen-caffeine (FIORICET, ESGIC) 50-325-40 MG tablet, Take 1 tablet by mouth every 12 (twelve) hours as needed for headache., Disp: 30 tablet, Rfl: 2 .  ibuprofen (ADVIL,MOTRIN) 600 MG tablet, Take 1 tablet (600 mg total) by mouth every 8 (eight) hours as needed., Disp: 30 tablet, Rfl: 0 .  labetalol (NORMODYNE) 200 MG tablet, TAKE ONE (1) TABLET BY MOUTH TWO (2) TIMES DAILY, Disp: 60 tablet, Rfl: 6 .  levonorgestrel (LILETTA, 52 MG,) 18.6 MCG/DAY  IUD IUD, 1 Intra Uterine Device (1 each total) by Intrauterine route once., Disp: 1 each, Rfl: 0  No Known Allergies   ROS  As noted in HPI.   Physical Exam  BP (!) 157/82   Pulse 95   Temp 98 F (36.7 C)   Resp 18   LMP 02/11/2018   SpO2 100%   Constitutional: Well developed, well nourished, no acute distress Eyes:  EOMI, conjunctiva normal bilaterally HENT: Normocephalic, atraumatic,mucus membranes moist.  Positive clear nasal congestion with erythematous, swollen turbinates.  Positive postnasal drip.  Normal oropharynx. Neck: No cervical lymphadenopathy Respiratory: Normal inspiratory effort lungs clear bilaterally. Cardiovascular: Normal rate regular rhythm no murmurs, rubs, gallops GI: nondistended skin: No rash, skin intact Musculoskeletal: no deformities Neurologic: Alert & oriented x 3, no focal neuro deficits Psychiatric: Speech and behavior appropriate   ED Course   Medications - No data to display  No orders of the defined types were placed in this encounter.   No results found for this or any previous visit (from the past 24 hour(s)). No results found.  ED Clinical Impression  Upper respiratory tract infection, unspecified type  Plantar fasciitis of right foot - Plan: ibuprofen (ADVIL,MOTRIN) 600 MG tablet   ED Assessment/Plan  Patient with URI.  Home with regular Mucinex, saline nasal irrigation, ibuprofen 600 mg take with 1 g of Tylenol 3-4 times a day as needed for headache, sore throat, Flonase.  Follow-up with PMD as needed.  Meds  ordered this encounter  Medications  . ibuprofen (ADVIL,MOTRIN) 600 MG tablet    Sig: Take 1 tablet (600 mg total) by mouth every 8 (eight) hours as needed.    Dispense:  30 tablet    Refill:  0    *This clinic note was created using Scientist, clinical (histocompatibility and immunogenetics). Therefore, there may be occasional mistakes despite careful proofreading.   ?   Domenick Gong, MD 02/11/18 2221

## 2018-02-11 NOTE — Discharge Instructions (Signed)
Take the medication as written. Take 1 gram of tylenol with the 600 mg motrin up to 3-4 times a day as needed for pain and headache. This is an effective combination. Drink extra fluids. Start taking regular mucinex to keep the mucus secretions thin. Use a neti pot or the NeilMed sinus rinse as often as you want to to reduce nasal congestion. Follow the directions on the box.  Go to www.goodrx.com to look up your medications. This will give you a list of where you can find your prescriptions at the most affordable prices. Or ask the pharmacist what the cash price is, or if they have any other discount programs available to help make your medication more affordable. This can be less expensive than what you would pay with insurance.

## 2018-02-18 IMAGING — US US MFM FETAL BPP W/O NON-STRESS
1 series · 14 of 28 positions shown · non-contrast
Comparison: none

[Series 1: us mfm fetal bpp w/o non-stress · 51 acquisitions, 14 frames shown]
[im 2/51]
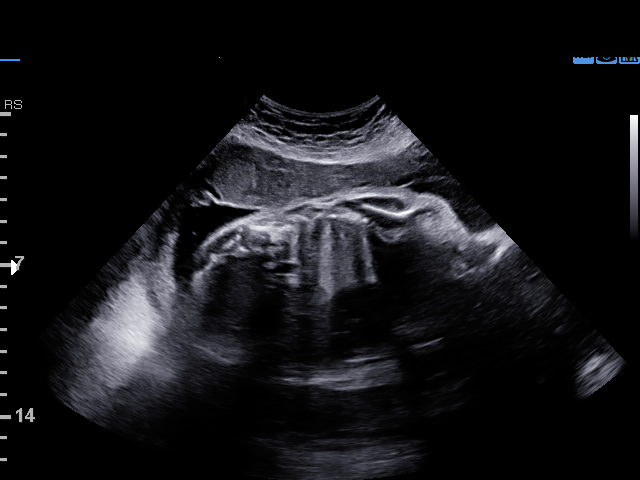
[im 6/51]
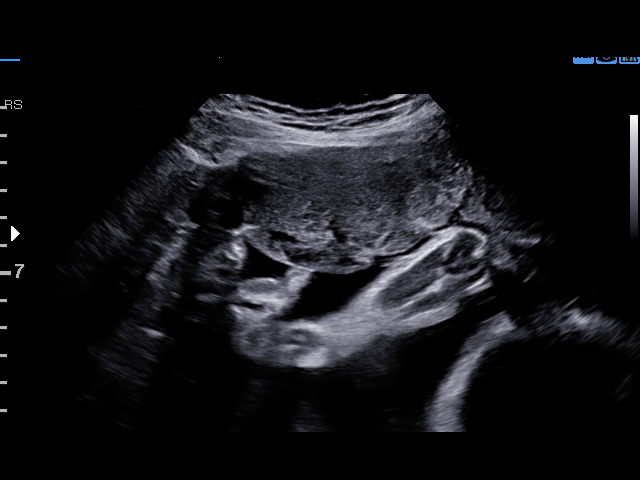
[im 10/51]
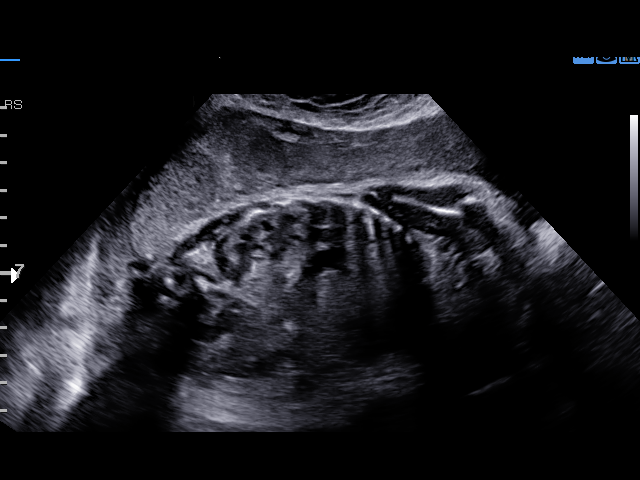
[im 13/51]
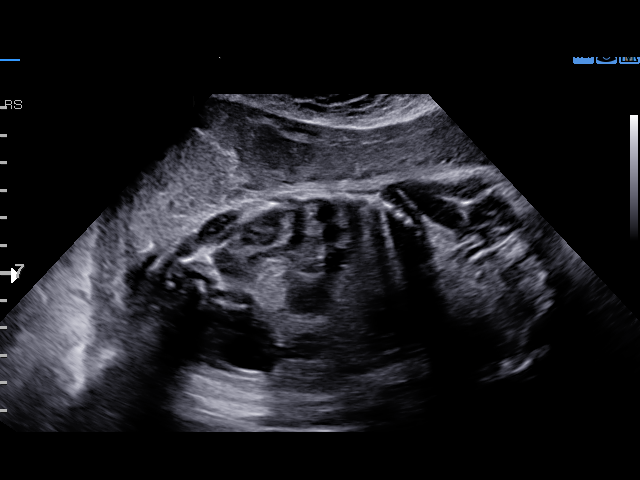
[im 17/51]
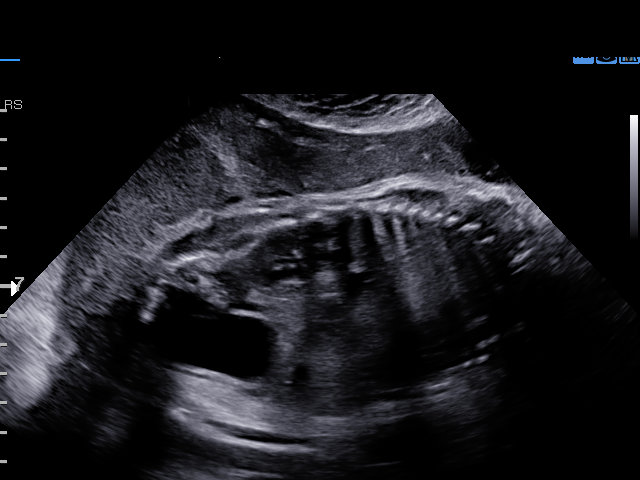
[im 21/51]
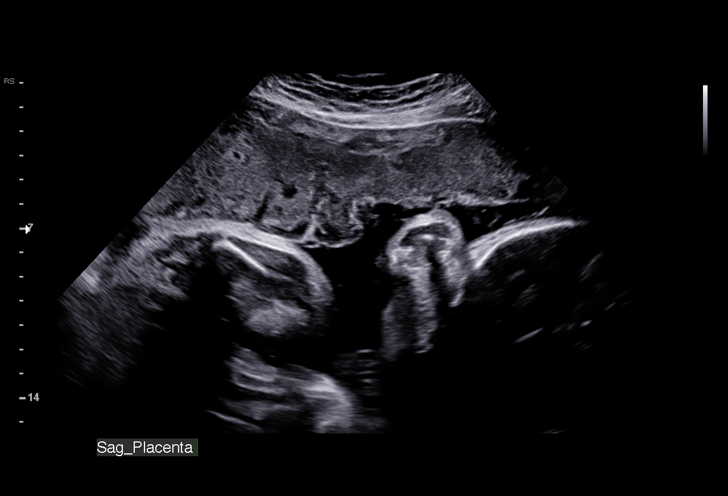
[im 25/51]
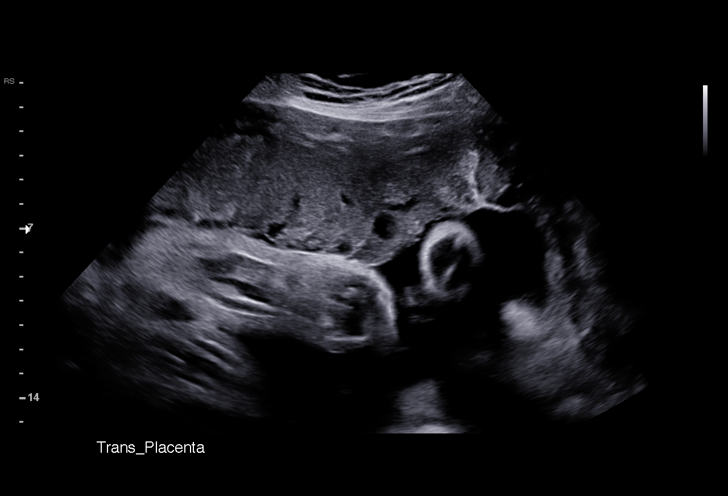
[im 28/51]
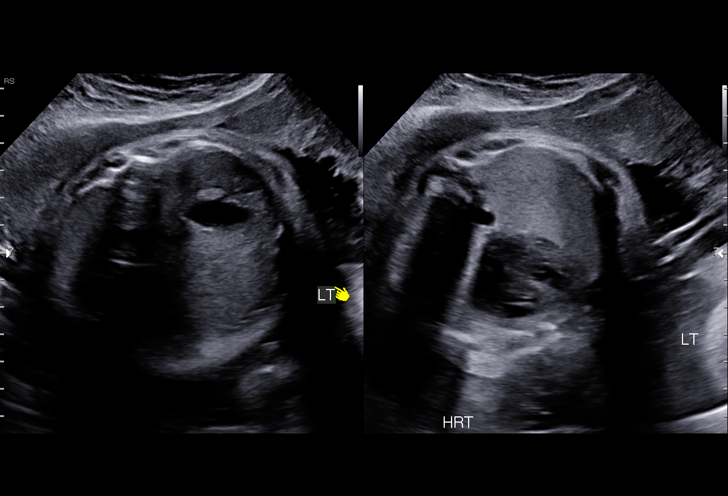
[im 32/51]
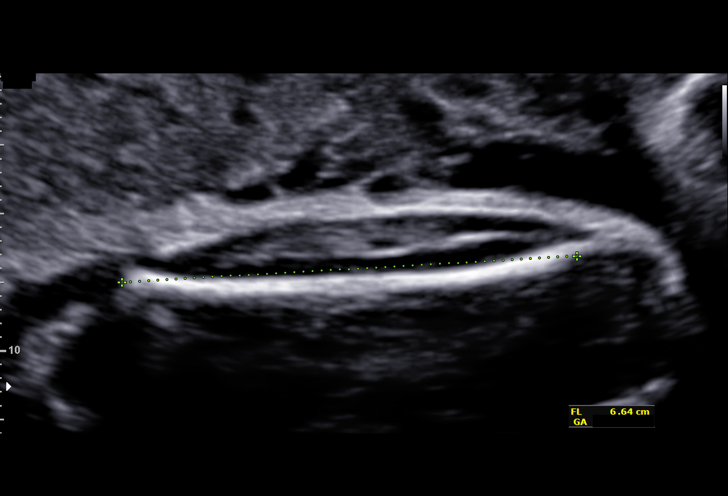
[im 36/51]
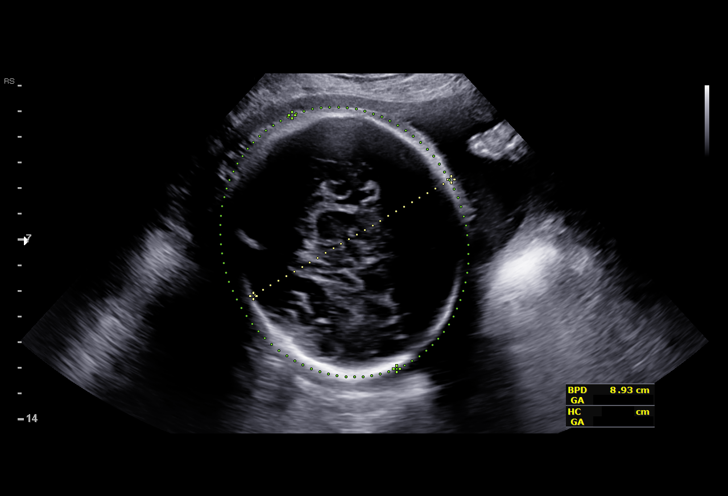
[im 39/51]
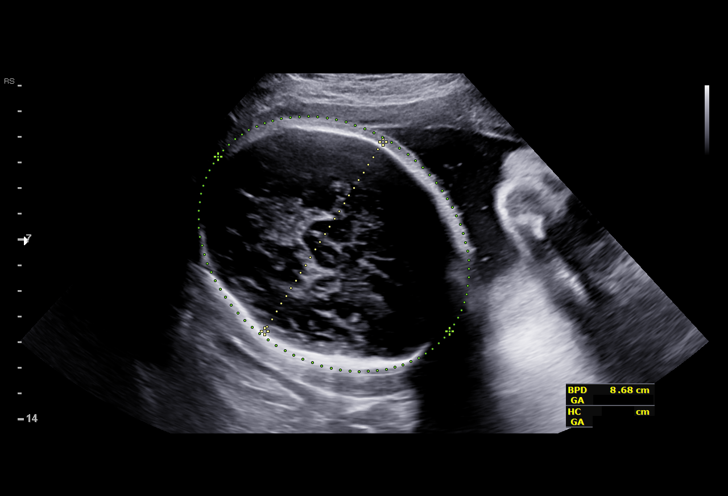
[im 43/51]
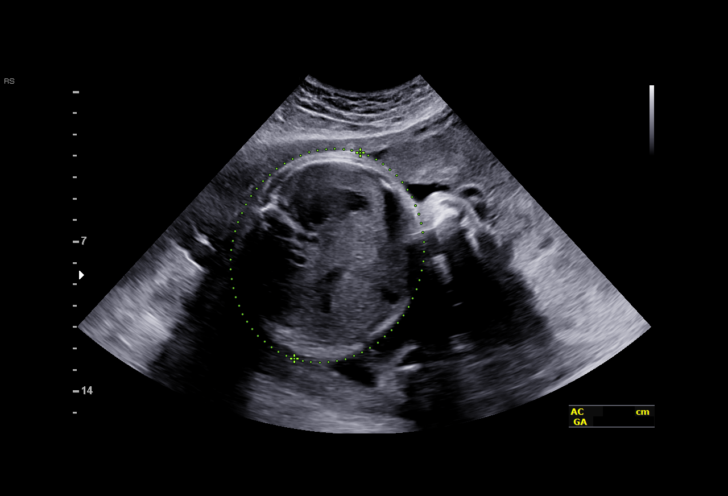
[im 47/51]
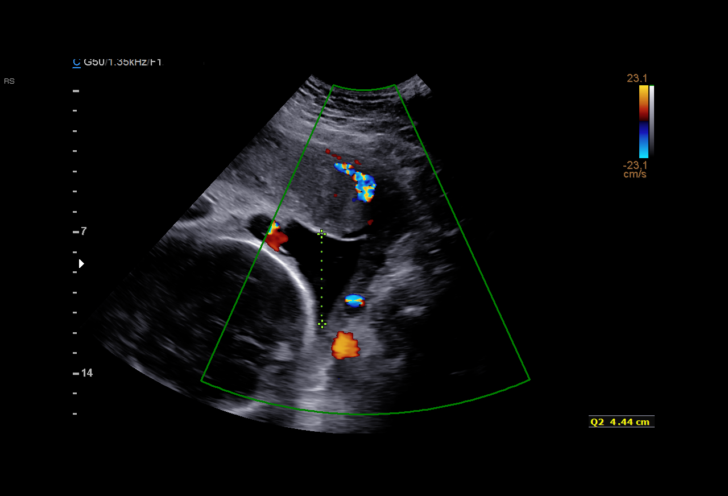
[im 51/51]
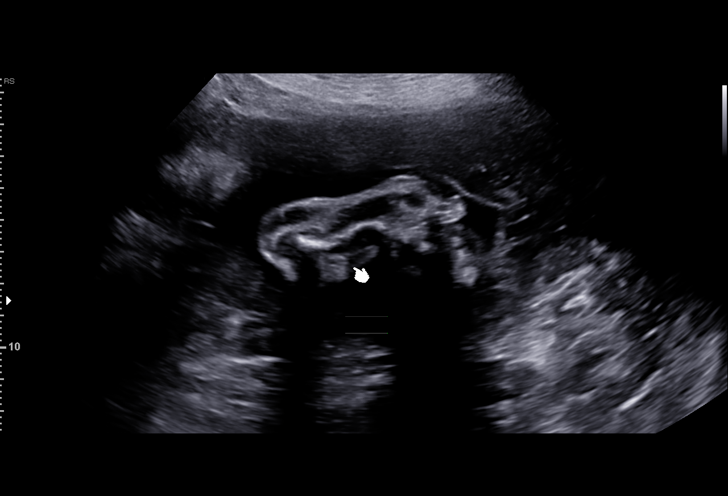

[14 of 28 positions shown; findings below may reference images not displayed]

OB/Gyn Clinic
[REDACTED]
Secondary Phy.:   [HOSPITAL]

1  [facility]              [id number]      [phone]     [id number]
2  [facility]              [id number]      [phone]     [id number]
Indications

33 weeks gestation of pregnancy
Hypertension - Chronic/Pre-existing (on
labetalol)
Obesity complicating pregnancy, third
trimester
Advanced maternal age multigravida 35+,
third trimester (declined testing)
OB History

Blood Type:            Height:  5'6"   Weight (lb):  228       BMI:
Gravidity:    5         Term:   4        Prem:   0         SAB:   0
TOP:          0       Ectopic:  0        Living: 4
Fetal Evaluation
Num Of Fetuses:     1
Fetal Heart         138
Rate(bpm):
Cardiac Activity:   Observed
Presentation:       Cephalic
Placenta:           Anterior, above cervical os
P. Cord Insertion:  Previously Visualized

Amniotic Fluid
AFI FV:      Subjectively within normal limits

AFI Sum(cm)     %Tile       Largest Pocket(cm)
22.31           85

RUQ(cm)       RLQ(cm)       LUQ(cm)        LLQ(cm)
7.4
Biophysical Evaluation

Amniotic F.V:   Pocket => 2 cm two         F. Tone:         Observed
planes
F. Movement:    Observed                   Score:           [DATE]
F. Breathing:   Observed
Biometry

BPD:      87.7  mm     G. Age:  35w 3d         92  %    CI:         77.13  %    70 - 86
FL/HC:       20.7  %    19.9 -
HC:      316.2  mm     G. Age:  35w 4d         68  %    HC/AC:       1.06       0.96 -
AC:        299  mm     G. Age:  33w 6d         65  %    FL/BPD:      74.8  %    71 - 87
FL:       65.6  mm     G. Age:  33w 6d         49  %    FL/AC:       21.9  %    20 - 24
HUM:      57.5  mm     G. Age:  33w 2d         57  %

Est. FW:    8611   gm     5 lb 3 oz     74  %
Gestational Age

LMP:           32w 2d        Date:  03/11/16                 EDD:    12/16/16
U/S Today:     34w 5d                                        EDD:    11/29/16
Best:          33w 3d     Det. By:  Early Ultrasound         EDD:    12/08/16
(05/14/16)
Anatomy

Cranium:               Appears normal         Aortic Arch:            Previously seen
Cavum:                 Previously seen        Ductal Arch:            Previously seen
Ventricles:            Appears normal         Diaphragm:              Previously seen
Choroid Plexus:        Previously seen        Stomach:                Appears normal, left
sided
Cerebellum:            Previously seen        Abdomen:                Appears normal
Posterior Fossa:       Previously seen        Abdominal Wall:         Previously seen
Nuchal Fold:           Previously seen        Cord Vessels:           Previously seen
Face:                  Orbits and profile     Kidneys:                Appear normal
previously seen
Lips:                  Previously seen        Bladder:                Appears normal
Thoracic:              Appears normal         Spine:                  Previously seen
Heart:                 Previously seen        Upper Extremities:      Previously seen
RVOT:                  Previously seen        Lower Extremities:      Previously seen
LVOT:                  Previously seen
Other:  Female gender previously seen. Heels,5th digit, and Nasal bone
previously visualized.
Cervix Uterus Adnexa

Cervix
Not visualized (advanced GA >55wks)

Uterus
No abnormality visualized.

Left Ovary
Not visualized.

Right Ovary
Not visualized.

Adnexa:       No abnormality visualized. No adnexal mass
visualized.
Impression

SIUP at 33+3 weeks
Normal interval anatomy; anatomic survey complete
Normal amniotic fluid volume
Appropriate interval growth with EFW at the 74th %tile
BPP [DATE]
Recommendations

Continue antenatal testing
Follow-up ultrasound for growth in 4 weeks

## 2018-03-18 ENCOUNTER — Encounter (HOSPITAL_COMMUNITY): Payer: Self-pay | Admitting: Emergency Medicine

## 2018-03-18 ENCOUNTER — Ambulatory Visit (HOSPITAL_COMMUNITY)
Admission: EM | Admit: 2018-03-18 | Discharge: 2018-03-18 | Disposition: A | Discharge status: Home / Self Care | Payer: BLUE CROSS/BLUE SHIELD | Attending: Internal Medicine | Admitting: Internal Medicine

## 2018-03-18 DIAGNOSIS — M542 Cervicalgia: Secondary | ICD-10-CM | POA: Diagnosis not present

## 2018-03-18 MED ORDER — DEXAMETHASONE SODIUM PHOSPHATE 10 MG/ML IJ SOLN
10.0000 mg | Freq: Once | INTRAMUSCULAR | Status: AC
  Administered 2018-03-18: 10 mg via INTRAMUSCULAR

## 2018-03-18 MED ORDER — KETOROLAC TROMETHAMINE 60 MG/2ML IM SOLN
60.0000 mg | Freq: Once | INTRAMUSCULAR | Status: AC
  Administered 2018-03-18: 60 mg via INTRAMUSCULAR

## 2018-03-18 MED ORDER — KETOROLAC TROMETHAMINE 60 MG/2ML IM SOLN
INTRAMUSCULAR | Status: AC
  Filled 2018-03-18: qty 2

## 2018-03-18 MED ORDER — CYCLOBENZAPRINE HCL 10 MG PO TABS: 10.0000 mg | ORAL_TABLET | Freq: Two times a day (BID) | ORAL | 0 refills | Status: DC | PRN

## 2018-03-18 MED ORDER — DEXAMETHASONE SODIUM PHOSPHATE 10 MG/ML IJ SOLN
INTRAMUSCULAR | Status: AC
  Filled 2018-03-18: qty 1

## 2018-03-18 MED ORDER — NAPROXEN 375 MG PO TABS: 375.0000 mg | ORAL_TABLET | Freq: Three times a day (TID) | ORAL | 0 refills | Status: DC

## 2018-03-18 NOTE — Discharge Instructions (Signed)
We gave you a shot of Toradol and Decadron today.  Please use Naprosyn and Flexeril for further neck pain management at home.  Flexeril will cause some sedation, please do not use when you need to drive, please limit use to at home or at bedtime.  Please follow-up here or with your primary provider if symptoms not improving or worsening.  Please return or go to emergency room if you develop changes in vision, difficulty speaking, weakness in arms or legs.

## 2018-03-18 NOTE — ED Triage Notes (Signed)
Pt c/o mid neck pain x3 days, pain severe with movement. Denies injury

## 2018-03-18 NOTE — ED Provider Notes (Signed)
[facility]    CSN: [id number] Arrival date & time: 03/18/18  1437     History   Chief Complaint Chief Complaint  Patient presents with  . Neck Pain    HPI Carolyn Ramsey is a 42 y.o. female presenting today for evaluation of neck pain.  States that for the past 4 days she has had severe neck pain has been causing a headache.  She has had limited range of motion of neck due to pain.  She denies any numbness or tingling.  Does occasionally notice some pain into her right arm.  Denies any changes in vision, weakness in her arms or legs.  Has tried acetaminophen without relief of her pain.  HPI  Past Medical History:  Diagnosis Date  . Hypertension   . Spinal headache     Patient Active Problem List   Diagnosis Date Noted  . Language barrier 08/06/2016  . Essential hypertension 11/14/2015  . 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 NO PAST SURGERIES      OB History    Gravida  5   Para  5   Term  5   Preterm  0   AB  0   Living  5     SAB  0   TAB  0   Ectopic  0   Multiple  0   Live Births  2            Home Medications    Prior to Admission medications   Medication Sig Start Date End Date Taking? Authorizing Provider  butalbital-acetaminophen-caffeine (FIORICET, ESGIC) 50-325-40 MG tablet Take 1 tablet by mouth every 12 (twelve) hours as needed for headache. 12/27/17 12/27/18  Hoy Register, MD  cyclobenzaprine (FLEXERIL) 10 MG tablet Take 1 tablet (10 mg total) by mouth 2 (two) times daily as needed for muscle spasms. 03/18/18   Zander Ingham C, PA-C  ibuprofen (ADVIL,MOTRIN) 600 MG tablet Take 1 tablet (600 mg total) by mouth every 8 (eight) hours as needed. 02/11/18   Domenick Gong, MD  labetalol (NORMODYNE) 200 MG tablet TAKE ONE (1) TABLET BY MOUTH TWO (2) TIMES DAILY 12/27/17   Hoy Register, MD  levonorgestrel (LILETTA, 52 MG,) 18.6 MCG/DAY IUD IUD 1 Intra Uterine Device  (1 each total) by Intrauterine route once. 02/09/17 02/09/17  Leftwich-Kirby, Wilmer Floor, CNM  naproxen (NAPROSYN) 375 MG tablet Take 1 tablet (375 mg total) by mouth 3 (three) times daily with meals. 03/18/18   Lulani Bour, Junius Creamer, PA-C    Family History Family History  Family history unknown: Yes    Social History Social History   Tobacco Use  . Smoking status: Never Smoker  . Smokeless tobacco: Never Used  Substance Use Topics  . Alcohol use: No    Alcohol/week: 0.0 oz  . Drug use: No     Allergies   Patient has no known allergies.   Review of Systems Review of Systems  Constitutional: Negative for activity change and appetite change.  HENT: Negative for trouble swallowing.   Eyes: Negative for pain and visual disturbance.  Respiratory: Negative for shortness of breath.   Cardiovascular: Negative for chest pain.  Gastrointestinal: Negative for abdominal pain, nausea and vomiting.  Musculoskeletal: Positive for myalgias, neck pain and neck stiffness. Negative for arthralgias, back pain and gait problem.  Skin: Negative for color change, rash and wound.  Neurological: Positive for headaches. Negative for dizziness, seizures, syncope, weakness, light-headedness and  numbness.     Physical Exam Triage Vital Signs ED Triage Vitals [03/18/18 1515]  Enc Vitals Group     BP 139/89     Pulse Rate 94     Resp 16     Temp 98.1 F (36.7 C)     Temp src      SpO2 100 %     Weight      Height      Head Circumference      Peak Flow      Pain Score      Pain Loc      Pain Edu?      Excl. in GC?    No data found.  Updated Vital Signs BP 139/89   Pulse 94   Temp 98.1 F (36.7 C)   Resp 16   SpO2 100%   Visual Acuity Right Eye Distance:   Left Eye Distance:   Bilateral Distance:    Right Eye Near:   Left Eye Near:    Bilateral Near:     Physical Exam  Constitutional: She is oriented to person, place, and time. She appears well-developed and well-nourished. No  distress.  HENT:  Head: Normocephalic and atraumatic.  Mouth/Throat: Oropharynx is clear and moist.  Eyes: Pupils are equal, round, and reactive to light. Conjunctivae and EOM are normal.  Neck: Neck supple.  Cardiovascular: Normal rate and regular rhythm.  No murmur heard. Pulmonary/Chest: Effort normal and breath sounds normal. No respiratory distress.  Abdominal: Soft. There is no tenderness.  Musculoskeletal: She exhibits no edema.  Mild tenderness to lateral neck musculature.  No focal midline tenderness.  Patient has limited range of motion to rightward and leftward Rotation.  Neurological: She is alert and oriented to person, place, and time. No cranial nerve deficit. Coordination normal.  Skin: Skin is warm and dry.  Psychiatric: She has a normal mood and affect.  Nursing note and vitals reviewed.    UC Treatments / Results  Labs (all labs ordered are listed, but only abnormal results are displayed) Labs Reviewed - No data to display  EKG None Radiology No results found.  Procedures Procedures (including critical care time)  Medications Ordered in UC Medications  ketorolac (TORADOL) injection 60 mg (has no administration in time range)  dexamethasone (DECADRON) injection 10 mg (has no administration in time range)     Initial Impression / Assessment and Plan / UC Course  I have reviewed the triage vital signs and the nursing notes.  Pertinent labs & imaging results that were available during my care of the patient were reviewed by me and considered in my medical decision making (see chart for details).     Patient likely with musculoskeletal pain as a cause of headache.  Blood pressure stable.  No focal neuro deficit, no red flags.  Will provide Toradol and Decadron in clinic today.  Will send home with Flexeril and Naprosyn to use for further management at home.  Discussed gradual resolution over the next 1-2 weeks. Discussed strict return precautions. Patient  verbalized understanding and is agreeable with plan.   Final Clinical Impressions(s) / UC Diagnoses   Final diagnoses:  Neck pain    ED Discharge Orders        Ordered    cyclobenzaprine (FLEXERIL) 10 MG tablet  2 times daily PRN     03/18/18 1617    naproxen (NAPROSYN) 375 MG tablet  3 times daily with meals     03/18/18 1617  Controlled Substance Prescriptions [place] Controlled Substance Registry consulted? Not Applicable   Lew DawesWieters, Yomira Flitton C, [place]PA-C 03/18/18 2117

## 2018-03-29 ENCOUNTER — Encounter: Payer: Self-pay | Admitting: Family Medicine

## 2018-03-29 ENCOUNTER — Ambulatory Visit: Payer: BLUE CROSS/BLUE SHIELD | Attending: Family Medicine | Admitting: Family Medicine

## 2018-03-29 DIAGNOSIS — I10 Essential (primary) hypertension: Secondary | ICD-10-CM | POA: Diagnosis not present

## 2018-03-29 DIAGNOSIS — R51 Headache: Secondary | ICD-10-CM | POA: Insufficient documentation

## 2018-03-29 DIAGNOSIS — Z79899 Other long term (current) drug therapy: Secondary | ICD-10-CM | POA: Diagnosis not present

## 2018-03-29 DIAGNOSIS — R519 Headache, unspecified: Secondary | ICD-10-CM

## 2018-03-29 MED ORDER — LABETALOL HCL 200 MG PO TABS: ORAL_TABLET | ORAL | 6 refills | Status: DC

## 2018-03-29 MED ORDER — BUTALBITAL-APAP-CAFFEINE 50-325-40 MG PO TABS: 1.0000 | ORAL_TABLET | Freq: Two times a day (BID) | ORAL | 3 refills | Status: DC | PRN

## 2018-03-29 NOTE — Patient Instructions (Signed)

## 2018-03-29 NOTE — Progress Notes (Signed)
Subjective:  Patient ID: Carolyn Ramsey, female    DOB: 10-07-1976  Age: 42 y.o. MRN: [id number]  CC: Hypertension and Headache   HPI Carolyn Ramsey is a 42 year old female with a history of hypertension, and chronic headaches who presents for follow-up visit.  Her headaches have been controlled on Fioricet and she is requesting a refill today. She endorses compliance with her antihypertensive and denies chest pains or shortness of breath and has no adverse effects from her medications. She had an ED visit 2 weeks ago for neck pain and was treated with Toradol and Decadron and discharged on Flexeril; she reports resolution of symptoms.  Past Medical History:  Diagnosis Date  . Hypertension   . Spinal headache     Past Surgical History:  Procedure Laterality Date  . NO PAST SURGERIES      No Known Allergies  Outpatient Medications Prior to Visit  Medication Sig Dispense Refill  . cyclobenzaprine (FLEXERIL) 10 MG tablet Take 1 tablet (10 mg total) by mouth 2 (two) times daily as needed for muscle spasms. 20 tablet 0  . ibuprofen (ADVIL,MOTRIN) 600 MG tablet Take 1 tablet (600 mg total) by mouth every 8 (eight) hours as needed. 30 tablet 0  . naproxen (NAPROSYN) 375 MG tablet Take 1 tablet (375 mg total) by mouth 3 (three) times daily with meals. 30 tablet 0  . butalbital-acetaminophen-caffeine (FIORICET, ESGIC) 50-325-40 MG tablet Take 1 tablet by mouth every 12 (twelve) hours as needed for headache. 30 tablet 2  . labetalol (NORMODYNE) 200 MG tablet TAKE ONE (1) TABLET BY MOUTH TWO (2) TIMES DAILY 60 tablet 6  . levonorgestrel (LILETTA, 52 MG,) 18.6 MCG/DAY IUD IUD 1 Intra Uterine Device (1 each total) by Intrauterine route once. 1 each 0   No facility-administered medications prior to visit.     ROS Review of Systems  Constitutional: Negative for activity change, appetite change and fatigue.  HENT: Negative for congestion, sinus pressure and sore throat.   Eyes: Negative  for visual disturbance.  Respiratory: Negative for cough, chest tightness, shortness of breath and wheezing.   Cardiovascular: Negative for chest pain and palpitations.  Gastrointestinal: Negative for abdominal distention, abdominal pain and constipation.  Endocrine: Negative for polydipsia.  Genitourinary: Negative for dysuria and frequency.  Musculoskeletal: Negative for arthralgias and back pain.  Skin: Negative for rash.  Neurological: Negative for tremors, light-headedness and numbness.  Hematological: Does not bruise/bleed easily.  Psychiatric/Behavioral: Negative for agitation and behavioral problems.    Objective:  BP (!) 145/101   Pulse 88   Temp 98.2 F (36.8 C) (Oral)   Ht 5' 7"  (1.702 m)   Wt 215 lb (97.5 kg)   SpO2 97%   BMI 33.67 kg/m   BP/Weight 03/29/2018 03/18/2018 8/93/7342  Systolic BP 876 811 572  Diastolic BP 620 89 82  Wt. (Lbs) 215 - -  BMI 33.67 - -      Physical Exam  Constitutional: She is oriented to person, place, and time. She appears well-developed and well-nourished.  Cardiovascular: Normal rate, normal heart sounds and intact distal pulses.  No murmur heard. Pulmonary/Chest: Effort normal and breath sounds normal. She has no wheezes. She has no rales. She exhibits no tenderness.  Abdominal: Soft. Bowel sounds are normal. She exhibits no distension and no mass. There is no tenderness.  Musculoskeletal: Normal range of motion.  Neurological: She is alert and oriented to person, place, and time.  Psychiatric: She has a normal mood and affect. Her behavior  is normal.    CMP Latest Ref Rng & Units 11/16/2017 03/25/2017 03/03/2017  Glucose 65 - 99 mg/dL 91 86 95  BUN 6 - 20 mg/dL 12 11 9   Creatinine 0.57 - 1.00 mg/dL 0.43(L) 0.39(L) 0.40(L)  Sodium 134 - 144 mmol/L 143 140 141  Potassium 3.5 - 5.2 mmol/L 4.2 4.0 3.5  Chloride 96 - 106 mmol/L 105 101 103  CO2 20 - 29 mmol/L 24 23 -  Calcium 8.7 - 10.2 mg/dL 9.2 9.4 -  Total Protein 6.0 - 8.5  g/dL - 7.2 8.2(H)  Total Bilirubin 0.0 - 1.2 mg/dL - 0.3 0.5  Alkaline Phos 39 - 117 IU/L - 80 70  AST 0 - 40 IU/L - 15 19  ALT 0 - 32 IU/L - 13 17    Lipid Panel     Component Value Date/Time   CHOL 159 03/05/2016 1018   TRIG 47 03/05/2016 1018   HDL 51 03/05/2016 1018   CHOLHDL 3.1 03/05/2016 1018   VLDL 9 03/05/2016 1018   LDLCALC 99 03/05/2016 1018    Assessment & Plan:   1. Essential hypertension Slightly elevated No regimen change today Counseled on blood pressure goal of less than 130/80, low-sodium, DASH diet, medication compliance, 150 minutes of moderate intensity exercise per week. Discussed medication compliance, adverse effects. - CMP14+EGFR; Future - Lipid panel; Future - labetalol (NORMODYNE) 200 MG tablet; TAKE ONE (1) TABLET BY MOUTH TWO (2) TIMES DAILY  Dispense: 60 tablet; Refill: 6  2. Nonintractable episodic headache, unspecified headache type Stable - butalbital-acetaminophen-caffeine (FIORICET, ESGIC) 50-325-40 MG tablet; Take 1 tablet by mouth every 12 (twelve) hours as needed for headache.  Dispense: 30 tablet; Refill: 3   Meds ordered this encounter  Medications  . butalbital-acetaminophen-caffeine (FIORICET, ESGIC) 50-325-40 MG tablet    Sig: Take 1 tablet by mouth every 12 (twelve) hours as needed for headache.    Dispense:  30 tablet    Refill:  3  . labetalol (NORMODYNE) 200 MG tablet    Sig: TAKE ONE (1) TABLET BY MOUTH TWO (2) TIMES DAILY    Dispense:  60 tablet    Refill:  6    Follow-up: Return in about 3 months (around 06/28/2018) for follow up of chronic medical condition.   Charlott Rakes MD

## 2018-03-30 ENCOUNTER — Ambulatory Visit: Payer: BLUE CROSS/BLUE SHIELD | Attending: Family Medicine

## 2018-03-30 DIAGNOSIS — I10 Essential (primary) hypertension: Secondary | ICD-10-CM | POA: Diagnosis not present

## 2018-03-30 NOTE — Progress Notes (Signed)
Patient here for lab visit  

## 2018-03-31 LAB — CMP14+EGFR
ALK PHOS: 79 IU/L (ref 39–117)
ALT: 11 IU/L (ref 0–32)
AST: 10 IU/L (ref 0–40)
Albumin/Globulin Ratio: 1.6 (ref 1.2–2.2)
Albumin: 4.2 g/dL (ref 3.5–5.5)
BUN/Creatinine Ratio: 18 (ref 9–23)
BUN: 8 mg/dL (ref 6–24)
Bilirubin Total: 0.3 mg/dL (ref 0.0–1.2)
CALCIUM: 8.9 mg/dL (ref 8.7–10.2)
CO2: 22 mmol/L (ref 20–29)
CREATININE: 0.44 mg/dL — AB (ref 0.57–1.00)
Chloride: 106 mmol/L (ref 96–106)
GFR calc Af Amer: 146 mL/min/{1.73_m2} (ref >59)
GFR, EST NON AFRICAN AMERICAN: 127 mL/min/{1.73_m2} (ref >59)
GLOBULIN, TOTAL: 2.7 g/dL (ref 1.5–4.5)
GLUCOSE: 89 mg/dL (ref 65–99)
Potassium: 3.9 mmol/L (ref 3.5–5.2)
SODIUM: 141 mmol/L (ref 134–144)
Total Protein: 6.9 g/dL (ref 6.0–8.5)

## 2018-03-31 LAB — LIPID PANEL
CHOLESTEROL TOTAL: 177 mg/dL (ref 100–199)
Chol/HDL Ratio: 2.6 ratio (ref 0.0–4.4)
HDL: 67 mg/dL (ref >39)
LDL CALC: 102 mg/dL — AB (ref 0–99)
Triglycerides: 40 mg/dL (ref 0–149)
VLDL CHOLESTEROL CAL: 8 mg/dL (ref 5–40)

## 2018-04-07 ENCOUNTER — Telehealth: Payer: Self-pay

## 2018-04-07 NOTE — Telephone Encounter (Signed)
-----   Message from Hoy Register, MD sent at 03/31/2018  4:18 PM EDT ----- Please inform the patient that labs are normal. Thank you.

## 2018-04-07 NOTE — Telephone Encounter (Signed)
Patient was called and informed of lab results. 

## 2018-04-20 ENCOUNTER — Ambulatory Visit (INDEPENDENT_AMBULATORY_CARE_PROVIDER_SITE_OTHER): Payer: BLUE CROSS/BLUE SHIELD | Admitting: Advanced Practice Midwife

## 2018-04-20 ENCOUNTER — Encounter: Payer: Self-pay | Admitting: Advanced Practice Midwife

## 2018-04-20 ENCOUNTER — Other Ambulatory Visit: Payer: Self-pay

## 2018-04-20 VITALS — BP 152/101 | HR 88 | Wt 211.0 lb

## 2018-04-20 DIAGNOSIS — I10 Essential (primary) hypertension: Secondary | ICD-10-CM

## 2018-04-20 DIAGNOSIS — Z30431 Encounter for routine checking of intrauterine contraceptive device: Secondary | ICD-10-CM

## 2018-04-20 MED ORDER — AMLODIPINE BESYLATE 10 MG PO TABS: 10.0000 mg | ORAL_TABLET | Freq: Every day | ORAL | 11 refills | Status: DC

## 2018-04-20 NOTE — Progress Notes (Signed)
  GYNECOLOGY CLINIC PROGRESS NOTE  History:  42 y.o. [id number] here today for today for IUD string check; Liletta IUD was placed  02/09/17.  She had routine string check at 6 weeks after IUD placed, which was normal. Since pt became pregnant with her IUD in place in 2017, she wanted another check today.  She has no complaints about the IUD, no concerning side effects.  The following portions of the patient's history were reviewed and updated as appropriate: allergies, current medications, past family history, past medical history, past social history, past surgical history and problem list. Last pap smear on 06/12/16 was normal, negative HRHPV.  Review of Systems:  Pertinent items are noted in HPI.   Objective:  Physical Exam Blood pressure (!) 152/101, pulse 88, weight 211 lb (95.7 kg), currently breastfeeding. Gen: NAD Abd: Soft, nontender and nondistended Pelvic: Normal appearing external genitalia; normal appearing vaginal mucosa and cervix.  IUD strings visualized, about 3 cm in length outside cervix.   Assessment & Plan:  1. Essential hypertension --Pt BP elevated today.   --Chart review reveals pt continues to take labetalol 200 mg BID, which was started during her previous pregnancy with her 36.16 year old daughter. --She has primary care with [facility] and has follow up visit scheduled in 3 months in the office. --D/C labetalol, change to Norvasc 10 mg daily.  Consider adding ACE inhibitor at 3 month follow up if indicated.  Pt currently fasting for religious reasons so recommend taking new medication during evenings when she is eating but may switch later to any time of day that is convenient.  2. IUD check up --Normal IUD check. --Patient to keep IUD in place for five years; can come in for removal if she desires pregnancy within the next five years. --Routine preventative health maintenance measures emphasized.    Sharen Counter, CNM 11:39 AM

## 2018-06-28 ENCOUNTER — Encounter: Payer: Self-pay | Admitting: Family Medicine

## 2018-06-28 ENCOUNTER — Ambulatory Visit: Payer: BLUE CROSS/BLUE SHIELD | Attending: Family Medicine | Admitting: Family Medicine

## 2018-06-28 VITALS — BP 131/89 | HR 99 | Temp 98.0°F | Ht 67.0 in | Wt 215.4 lb

## 2018-06-28 DIAGNOSIS — Z79899 Other long term (current) drug therapy: Secondary | ICD-10-CM | POA: Insufficient documentation

## 2018-06-28 DIAGNOSIS — R51 Headache: Secondary | ICD-10-CM | POA: Diagnosis not present

## 2018-06-28 DIAGNOSIS — R519 Headache, unspecified: Secondary | ICD-10-CM

## 2018-06-28 DIAGNOSIS — M542 Cervicalgia: Secondary | ICD-10-CM | POA: Diagnosis not present

## 2018-06-28 DIAGNOSIS — Z131 Encounter for screening for diabetes mellitus: Secondary | ICD-10-CM | POA: Insufficient documentation

## 2018-06-28 DIAGNOSIS — I10 Essential (primary) hypertension: Secondary | ICD-10-CM | POA: Insufficient documentation

## 2018-06-28 LAB — POCT GLYCOSYLATED HEMOGLOBIN (HGB A1C): HbA1c, POC (prediabetic range): 5.6 % — AB (ref 5.7–6.4)

## 2018-06-28 MED ORDER — BUTALBITAL-APAP-CAFFEINE 50-325-40 MG PO TABS: 1.0000 | ORAL_TABLET | Freq: Two times a day (BID) | ORAL | 1 refills | Status: DC | PRN

## 2018-06-28 MED ORDER — METHOCARBAMOL 750 MG PO TABS: 750.0000 mg | ORAL_TABLET | Freq: Two times a day (BID) | ORAL | 1 refills | Status: DC | PRN

## 2018-06-28 NOTE — Patient Instructions (Signed)

## 2018-06-28 NOTE — Progress Notes (Signed)
Subjective:  Patient ID: Carolyn Ramsey, female    DOB: 1976/01/12  Age: 42 y.o. MRN: [id number]  CC: Hypertension   HPI Carolyn Ramsey  is a 42 year old female with a history of hypertension, and chronic headaches who presents for follow-up visit. At her last visit with her GYN 2 months ago labetalol was changed to amlodipine for management of her blood pressure and she reports doing well on amlodipine and no longer takes labetalol.  Her headaches are intermittent and usually hospital and have been controlled on Fioricet. She has noticed intermittent bilateral posterior neck pain.  Seen for same at urgent care and placed on Flexeril which she says gave her GI side effects and was ineffective.  She admits to frequently bending her neck in a flexed position. She dis have an elevated A1c of 6.3 in 2014; we will repeat today.  Past Medical History:  Diagnosis Date  . Hypertension   . Spinal headache     Past Surgical History:  Procedure Laterality Date  . NO PAST SURGERIES      No Known Allergies   Outpatient Medications Prior to Visit  Medication Sig Dispense Refill  . amLODipine (NORVASC) 10 MG tablet Take 1 tablet (10 mg total) by mouth daily. 30 tablet 11  . ibuprofen (ADVIL,MOTRIN) 600 MG tablet Take 1 tablet (600 mg total) by mouth every 8 (eight) hours as needed. (Patient not taking: Reported on 04/20/2018) 30 tablet 0  . levonorgestrel (LILETTA, 52 MG,) 18.6 MCG/DAY IUD IUD 1 Intra Uterine Device (1 each total) by Intrauterine route once. (Patient not taking: Reported on 06/28/2018) 1 each 0  . naproxen (NAPROSYN) 375 MG tablet Take 1 tablet (375 mg total) by mouth 3 (three) times daily with meals. (Patient not taking: Reported on 04/20/2018) 30 tablet 0  . butalbital-acetaminophen-caffeine (FIORICET, ESGIC) 50-325-40 MG tablet Take 1 tablet by mouth every 12 (twelve) hours as needed for headache. (Patient not taking: Reported on 04/20/2018) 30 tablet 3  . cyclobenzaprine  (FLEXERIL) 10 MG tablet Take 1 tablet (10 mg total) by mouth 2 (two) times daily as needed for muscle spasms. (Patient not taking: Reported on 04/20/2018) 20 tablet 0  . labetalol (NORMODYNE) 200 MG tablet TAKE ONE (1) TABLET BY MOUTH TWO (2) TIMES DAILY (Patient not taking: Reported on 06/28/2018) 60 tablet 6   No facility-administered medications prior to visit.     ROS Review of Systems  Constitutional: Negative for activity change, appetite change and fatigue.  HENT: Negative for congestion, sinus pressure and sore throat.   Eyes: Negative for visual disturbance.  Respiratory: Negative for cough, chest tightness, shortness of breath and wheezing.   Cardiovascular: Negative for chest pain and palpitations.  Gastrointestinal: Negative for abdominal distention, abdominal pain and constipation.  Endocrine: Negative for polydipsia.  Genitourinary: Negative for dysuria and frequency.  Musculoskeletal: Negative for arthralgias and back pain.  Skin: Negative for rash.  Neurological: Negative for tremors, light-headedness and numbness.  Hematological: Does not bruise/bleed easily.  Psychiatric/Behavioral: Negative for agitation and behavioral problems.    Objective:  BP 131/89   Pulse 99   Temp 98 F (36.7 C) (Oral)   Ht 5\' 7"  (1.702 m)   Wt 215 lb 6.4 oz (97.7 kg)   SpO2 100%   BMI 33.74 kg/m   BP/Weight 06/28/2018 04/20/2018 03/29/2018  Systolic BP 131 152 145  Diastolic BP 89 101 101  Wt. (Lbs) 215.4 211 215  BMI 33.74 33.05 33.67      Physical Exam  Constitutional: She is oriented to person, place, and time. She appears well-developed and well-nourished.  Neck: No thyromegaly present.  Cardiovascular: Normal rate, normal heart sounds and intact distal pulses.  No murmur heard. Pulmonary/Chest: Effort normal and breath sounds normal. She has no wheezes. She has no rales. She exhibits no tenderness.  Abdominal: Soft. Bowel sounds are normal. She exhibits no distension and no  mass. There is no tenderness.  Musculoskeletal: Normal range of motion. She exhibits no tenderness (no TTP of neck muscles).  Neurological: She is alert and oriented to person, place, and time.     CMP Latest Ref Rng & Units 03/30/2018 11/16/2017 03/25/2017  Glucose 65 - 99 mg/dL 89 91 86  BUN 6 - 24 mg/dL 8 12 11   Creatinine 0.57 - 1.00 mg/dL 1.61(W0.44(L) 9.60(A0.43(L) 5.40(J0.39(L)  Sodium 134 - 144 mmol/L 141 143 140  Potassium 3.5 - 5.2 mmol/L 3.9 4.2 4.0  Chloride 96 - 106 mmol/L 106 105 101  CO2 20 - 29 mmol/L 22 24 23   Calcium 8.7 - 10.2 mg/dL 8.9 9.2 9.4  Total Protein 6.0 - 8.5 g/dL 6.9 - 7.2  Total Bilirubin 0.0 - 1.2 mg/dL 0.3 - 0.3  Alkaline Phos 39 - 117 IU/L 79 - 80  AST 0 - 40 IU/L 10 - 15  ALT 0 - 32 IU/L 11 - 13    Lipid Panel     Component Value Date/Time   CHOL 177 03/30/2018 0938   TRIG 40 03/30/2018 0938   HDL 67 03/30/2018 0938   CHOLHDL 2.6 03/30/2018 0938   CHOLHDL 3.1 03/05/2016 1018   VLDL 9 03/05/2016 1018   LDLCALC 102 (H) 03/30/2018 0938   Lab Results  Component Value Date   HGBA1C 5.6 (A) 06/28/2018     Assessment & Plan:   1. Essential hypertension Controlled Continue amlodipine Counseled on blood pressure goal of less than 130/80, low-sodium, DASH diet, medication compliance, 150 minutes of moderate intensity exercise per week. Discussed medication compliance, adverse effects.  2. Neck pain Likely muscle strain Apply heat - methocarbamol (ROBAXIN) 750 MG tablet; Take 1 tablet (750 mg total) by mouth every 12 (twelve) hours as needed for muscle spasms.  Dispense: 60 tablet; Refill: 1  3. Nonintractable episodic headache, unspecified headache type Controlled - butalbital-acetaminophen-caffeine (FIORICET, ESGIC) 50-325-40 MG tablet; Take 1 tablet by mouth every 12 (twelve) hours as needed for headache.  Dispense: 30 tablet; Refill: 1  4.  Screening for diabetes mellitus A1c is normal at 5.6 Meds ordered this encounter  Medications  .  butalbital-acetaminophen-caffeine (FIORICET, ESGIC) 50-325-40 MG tablet    Sig: Take 1 tablet by mouth every 12 (twelve) hours as needed for headache.    Dispense:  30 tablet    Refill:  1  . methocarbamol (ROBAXIN) 750 MG tablet    Sig: Take 1 tablet (750 mg total) by mouth every 12 (twelve) hours as needed for muscle spasms.    Dispense:  60 tablet    Refill:  1    Follow-up: Return in about 6 months (around 12/29/2018) for Chronic medical conditions.   Hoy RegisterEnobong Jakyra Kenealy MD

## 2018-11-01 ENCOUNTER — Encounter: Payer: Self-pay | Admitting: Family Medicine

## 2018-11-01 ENCOUNTER — Ambulatory Visit: Payer: BLUE CROSS/BLUE SHIELD | Attending: Family Medicine | Admitting: Family Medicine

## 2018-11-01 VITALS — BP 144/96 | HR 101 | Temp 98.0°F | Ht 67.0 in | Wt 216.8 lb

## 2018-11-01 DIAGNOSIS — G44029 Chronic cluster headache, not intractable: Secondary | ICD-10-CM | POA: Diagnosis not present

## 2018-11-01 DIAGNOSIS — I10 Essential (primary) hypertension: Secondary | ICD-10-CM | POA: Diagnosis not present

## 2018-11-01 DIAGNOSIS — Z79899 Other long term (current) drug therapy: Secondary | ICD-10-CM | POA: Insufficient documentation

## 2018-11-01 DIAGNOSIS — Z793 Long term (current) use of hormonal contraceptives: Secondary | ICD-10-CM | POA: Insufficient documentation

## 2018-11-01 DIAGNOSIS — Z791 Long term (current) use of non-steroidal anti-inflammatories (NSAID): Secondary | ICD-10-CM | POA: Insufficient documentation

## 2018-11-01 MED ORDER — VERAPAMIL HCL ER 240 MG PO TBCR: 240.0000 mg | EXTENDED_RELEASE_TABLET | Freq: Every day | ORAL | 3 refills | Status: DC

## 2018-11-01 NOTE — Progress Notes (Signed)
Subjective:  Patient ID: Carolyn Ramsey, female    DOB: 05/02/1976  Age: 42 y.o. MRN: [id number]  CC: Headache and Hypertension   HPI Carolyn Giovannibttsam Trinidad  is a 42 year old female with a history of hypertension, and chronic headaches who presents for follow-up visit. She complains of headache on the right temporal aspect of her head with associated retro-orbital pain but denies tearing from her right eye, sinus congestion, postnasal drip, nausea, vomiting or blurry vision, to phobia. Symptoms are not restricted to certain times of the day and she remains on Fioricet.  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 She has no weakness of her extremities.   Past Medical History:  Diagnosis Date  . Hypertension   . Spinal headache     Past Surgical History:  Procedure Laterality Date  . NO PAST SURGERIES      No Known Allergies   Outpatient Medications Prior to Visit  Medication Sig Dispense Refill  . amLODipine (NORVASC) 10 MG tablet Take 1 tablet (10 mg total) by mouth daily. 30 tablet 11  . butalbital-acetaminophen-caffeine (FIORICET, ESGIC) 50-325-40 MG tablet Take 1 tablet by mouth every 12 (twelve) hours as needed for headache. 30 tablet 1  . levonorgestrel (LILETTA, 52 MG,) 18.6 MCG/DAY IUD IUD 1 Intra Uterine Device (1 each total) by Intrauterine route once. 1 each 0  . ibuprofen (ADVIL,MOTRIN) 600 MG tablet Take 1 tablet (600 mg total) by mouth every 8 (eight) hours as needed. (Patient not taking: Reported on 04/20/2018) 30 tablet 0  . methocarbamol (ROBAXIN) 750 MG tablet Take 1 tablet (750 mg total) by mouth every 12 (twelve) hours as needed for muscle spasms. (Patient not taking: Reported on 11/01/2018) 60 tablet 1  . naproxen (NAPROSYN) 375 MG tablet Take 1 tablet (375 mg total) by mouth 3 (three) times daily with meals. (Patient not taking: Reported on 04/20/2018) 30 tablet 0   No facility-administered medications prior to visit.      ROS Review of Systems  Constitutional: Negative for activity change, appetite change and fatigue.  HENT: Negative for congestion, sinus pressure and sore throat.   Eyes: Negative for visual disturbance.  Respiratory: Negative for cough, chest tightness, shortness of breath and wheezing.   Cardiovascular: Negative for chest pain and palpitations.  Gastrointestinal: Negative for abdominal distention, abdominal pain and constipation.  Endocrine: Negative for polydipsia.  Genitourinary: Negative for dysuria and frequency.  Musculoskeletal: Negative for arthralgias and back pain.  Skin: Negative for rash.  Neurological: Positive for headaches. Negative for tremors, light-headedness and numbness.  Hematological: Does not bruise/bleed easily.  Psychiatric/Behavioral: Negative for agitation and behavioral problems.    Objective:  BP (!) 144/96   Pulse (!) 101   Temp 98 F (36.7 C) (Oral)   Ht 5\' 7"  (1.702 m)   Wt 216 lb 12.8 oz (98.3 kg)   SpO2 100%   BMI 33.96 kg/m   BP/Weight 11/01/2018 06/28/2018 04/20/2018  Systolic BP 144 131 152  Diastolic BP 96 89 101  Wt. (Lbs) 216.8 215.4 211  BMI 33.96 33.74 33.05      Physical Exam  Constitutional: She is oriented to person, place, and time. She appears well-developed and well-nourished.  Cardiovascular: Normal heart sounds and intact distal pulses. Tachycardia present.  No murmur heard. Pulmonary/Chest: Effort normal and breath sounds normal. She has no wheezes. She has no rales. She exhibits no tenderness.  Abdominal: Soft. Bowel sounds are normal. She exhibits no distension and no mass. There  is no tenderness.  Musculoskeletal: Normal range of motion.  Neurological: She is alert and oriented to person, place, and time. No cranial nerve deficit.  Skin: Skin is warm and dry.  Psychiatric: She has a normal mood and affect.     Assessment & Plan:   1. 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  Dispense: 30 tablet; Refill: 3  2. Essential hypertension Uncontrolled Currently on amlodipine and hopefully addition of verapamil will be beneficial     Meds ordered this encounter  Medications  . verapamil (CALAN-SR) 240 MG CR tablet    Sig: Take 1 tablet (240 mg total) by mouth at bedtime.    Dispense:  30 tablet    Refill:  3    Follow-up: Return in about 3 months (around 01/31/2019) for follow up of chronic medical conditions.   Hoy Register MD

## 2018-11-01 NOTE — Progress Notes (Signed)
Patient is having pain in top of head. 

## 2018-11-03 ENCOUNTER — Telehealth: Payer: Self-pay | Admitting: Family Medicine

## 2018-11-03 NOTE — Telephone Encounter (Signed)
Kylie from [employer] called to request prior auth be completed on a scheduled appt for  -CT Head WO Contrast.  Please follow up

## 2018-11-07 ENCOUNTER — Ambulatory Visit (HOSPITAL_COMMUNITY): Payer: BLUE CROSS/BLUE SHIELD

## 2018-11-10 NOTE — Telephone Encounter (Signed)
CT HEAD Requires [employer] per [employer] website per Preservice center .  Pt has APPT 11/14/18. Call [phone] Kylie in pre-service center to give prior auth#.

## 2018-11-11 NOTE — Telephone Encounter (Signed)
Prior Berkley Harveyauth has been completed and Jenel LucksKylie has been informed of the prior [place] approval code.

## 2018-11-11 NOTE — Telephone Encounter (Signed)
Kylie with [employer] called in regards to a prior Auth to be completed for the patient. They have scheduled the appt. for 12/16 @4 :00.  Please follow up   [phone]

## 2018-11-14 ENCOUNTER — Ambulatory Visit (HOSPITAL_COMMUNITY)
Admission: RE | Admit: 2018-11-14 | Discharge: 2018-11-14 | Disposition: A | Discharge status: Home / Self Care | Payer: BLUE CROSS/BLUE SHIELD | Source: Ambulatory Visit | Attending: Family Medicine | Admitting: Family Medicine

## 2018-11-14 DIAGNOSIS — G44029 Chronic cluster headache, not intractable: Secondary | ICD-10-CM | POA: Insufficient documentation

## 2018-11-16 ENCOUNTER — Telehealth: Payer: Self-pay

## 2018-11-16 NOTE — Telephone Encounter (Signed)
Patient was called and informed of CT scan results. 

## 2018-11-16 NOTE — Telephone Encounter (Signed)
-----   Message from Hoy RegisterEnobong Newlin, MD sent at 11/16/2018  1:50 PM EST ----- CT of the head reveals no abnormal findings

## 2019-01-07 ENCOUNTER — Encounter (HOSPITAL_COMMUNITY): Payer: Self-pay | Admitting: Emergency Medicine

## 2019-01-07 ENCOUNTER — Ambulatory Visit (HOSPITAL_COMMUNITY)
Admission: EM | Admit: 2019-01-07 | Discharge: 2019-01-07 | Disposition: A | Discharge status: Home / Self Care | Payer: BLUE CROSS/BLUE SHIELD | Attending: Family Medicine | Admitting: Family Medicine

## 2019-01-07 DIAGNOSIS — N61 Mastitis without abscess: Secondary | ICD-10-CM | POA: Diagnosis not present

## 2019-01-07 MED ORDER — AMOXICILLIN-POT CLAVULANATE 875-125 MG PO TABS: 1.0000 | ORAL_TABLET | Freq: Two times a day (BID) | ORAL | 0 refills | Status: DC

## 2019-01-07 MED ORDER — IBUPROFEN 600 MG PO TABS: 600.0000 mg | ORAL_TABLET | Freq: Four times a day (QID) | ORAL | 0 refills | Status: DC | PRN

## 2019-01-07 NOTE — ED Triage Notes (Signed)
Pt sts right breast pain

## 2019-01-07 NOTE — Discharge Instructions (Signed)
Use anti-inflammatories for pain/swelling. You may take up to 600 mg Ibuprofen every 8 hours with food. You may supplement Ibuprofen with Tylenol [phone] mg every 8 hours.  Warm compresses Augmentin twice daily  Follow up in 1 week if not having improvement as we may need to send you to the breast center

## 2019-01-08 NOTE — ED Provider Notes (Signed)
[facility]    CSN: [id number] Arrival date & time: 01/07/19  1624     History   Chief Complaint Chief Complaint  Patient presents with  . Breast Pain    HPI Arabic interpretation via [employer] interpreter Carolyn Ramsey is a 43 y.o. female history of hypertension presenting today for evaluation of right breast pain.  Patient states that she weaned from breast-feeding on 1/25.  Since she has developed pain and has felt her breast feel feverish.  She has not taken anything for her pain.  She has had some mild headache and chills, but no known fevers.  Denies any changes to the skin.  Denies feeling any specific masses.  Has some mild discomfort in her left breast, but main pain is in her right. HPI  Past Medical History:  Diagnosis Date  . Hypertension   . Spinal headache     Patient Active Problem List   Diagnosis Date Noted  . Language barrier 08/06/2016  . Essential hypertension 11/14/2015  . 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 NO PAST SURGERIES      OB History    Gravida  5   Para  5   Term  5   Preterm  0   AB  0   Living  5     SAB  0   TAB  0   Ectopic  0   Multiple  0   Live Births  2            Home Medications    Prior to Admission medications   Medication Sig Start Date End Date Taking? Authorizing Provider  amLODipine (NORVASC) 10 MG tablet Take 1 tablet (10 mg total) by mouth daily. 04/20/18   Leftwich-Kirby, Wilmer FloorLisa A, CNM  amoxicillin-clavulanate (AUGMENTIN) 875-125 MG tablet Take 1 tablet by mouth every 12 (twelve) hours. 01/07/19   Sondos Wolfman C, PA-C  butalbital-acetaminophen-caffeine (FIORICET, ESGIC) [phone] MG tablet Take 1 tablet by mouth every 12 (twelve) hours as needed for headache. 06/28/18 06/28/19  Hoy RegisterNewlin, Enobong, MD  ibuprofen (ADVIL,MOTRIN) 600 MG tablet Take 1 tablet (600 mg total) by mouth every 6 (six) hours as needed. 01/07/19   Roberto Hlavaty C, PA-C    levonorgestrel (LILETTA, 52 MG,) 18.6 MCG/DAY IUD IUD 1 Intra Uterine Device (1 each total) by Intrauterine route once. 02/09/17 04/21/23  Leftwich-Kirby, Wilmer FloorLisa A, CNM  methocarbamol (ROBAXIN) 750 MG tablet Take 1 tablet (750 mg total) by mouth every 12 (twelve) hours as needed for muscle spasms. Patient not taking: Reported on 11/01/2018 06/28/18   Hoy RegisterNewlin, Enobong, MD  naproxen (NAPROSYN) 375 MG tablet Take 1 tablet (375 mg total) by mouth 3 (three) times daily with meals. Patient not taking: Reported on 04/20/2018 03/18/18   Dewitte Vannice C, PA-C  verapamil (CALAN-SR) 240 MG CR tablet Take 1 tablet (240 mg total) by mouth at bedtime. 11/01/18   Hoy RegisterNewlin, Enobong, MD    Family History Family History  Family history unknown: Yes    Social History Social History   Tobacco Use  . Smoking status: Never Smoker  . Smokeless tobacco: Never Used  Substance Use Topics  . Alcohol use: No    Alcohol/week: 0.0 standard drinks  . Drug use: No     Allergies   Patient has no known allergies.   Review of Systems Review of Systems  Constitutional: Positive for chills. Negative for fatigue and fever.  Eyes: Negative for visual disturbance.  Respiratory: Negative for shortness of breath.   Cardiovascular: Positive for chest pain.  Gastrointestinal: Negative for abdominal pain, nausea and vomiting.  Musculoskeletal: Negative for arthralgias and joint swelling.  Skin: Negative for color change, rash and wound.  Neurological: Positive for headaches. Negative for dizziness, weakness and light-headedness.     Physical Exam Triage Vital Signs ED Triage Vitals [01/07/19 1720]  Enc Vitals Group     BP (!) 143/90     Pulse Rate (!) 102     Resp 18     Temp 98.8 F (37.1 C)     Temp Source Oral     SpO2 100 %     Weight      Height      Head Circumference      Peak Flow      Pain Score      Pain Loc      Pain Edu?      Excl. in GC?    No data found.  Updated Vital Signs BP (!) 143/90  (BP Location: Left Arm)   Pulse (!) 102   Temp 98.8 F (37.1 C) (Oral)   Resp 18   SpO2 100%   Visual Acuity Right Eye Distance:   Left Eye Distance:   Bilateral Distance:    Right Eye Near:   Left Eye Near:    Bilateral Near:     Physical Exam Vitals signs and nursing note reviewed.  Constitutional:      Appearance: She is well-developed.     Comments: No acute distress  HENT:     Head: Normocephalic and atraumatic.     Nose: Nose normal.  Eyes:     Conjunctiva/sclera: Conjunctivae normal.  Neck:     Musculoskeletal: Neck supple.  Cardiovascular:     Rate and Rhythm: Normal rate.  Pulmonary:     Effort: Pulmonary effort is normal. No respiratory distress.     Comments: Breathing comfortably at rest, CTABL, no wheezing, rales or other adventitious sounds auscultated Chest:       Comments: Pain and tenderness to area at approximately 11:00 above right areolar area, mild erythema, has mild discomfort with palpation of most of breast  Bilateral breasts with irregular densities, no specific masses palpated at area of discomfort Abdominal:     General: There is no distension.  Musculoskeletal: Normal range of motion.  Skin:    General: Skin is warm and dry.  Neurological:     Mental Status: She is alert and oriented to person, place, and time.      UC Treatments / Results  Labs (all labs ordered are listed, but only abnormal results are displayed) Labs Reviewed - No data to display  EKG None  Radiology No results found.  Procedures Procedures (including critical care time)  Medications Ordered in UC Medications - No data to display  Initial Impression / Assessment and Plan / UC Course  I have reviewed the triage vital signs and the nursing notes.  Pertinent labs & imaging results that were available during my care of the patient were reviewed by me and considered in my medical decision making (see chart for details).     Patient with irregular  breast tissue likely secondary to recent breast-feeding, area of tenderness and slight erythema secondary to recent weaning of breast-feeding.  Will treat for mastitis with anti-inflammatories and warm compresses.  We will go ahead and initiate on Augmentin to cover for infection/abscess.  Will have patient follow-up in 1  week if not having improvement with these recommendations, may need to send for imaging at breast center if symptoms persistent.Discussed strict return precautions. Patient verbalized understanding and is agreeable with plan.  Final Clinical Impressions(s) / UC Diagnoses   Final diagnoses:  Mastitis     Discharge Instructions     Use anti-inflammatories for pain/swelling. You may take up to 600 mg Ibuprofen every 8 hours with food. You may supplement Ibuprofen with Tylenol [phone] mg every 8 hours.  Warm compresses Augmentin twice daily  Follow up in 1 week if not having improvement as we may need to send you to the breast center   ED Prescriptions    Medication Sig Dispense Auth. Provider   amoxicillin-clavulanate (AUGMENTIN) 875-125 MG tablet Take 1 tablet by mouth every 12 (twelve) hours. 14 tablet Undrea Archbold C, PA-C   ibuprofen (ADVIL,MOTRIN) 600 MG tablet Take 1 tablet (600 mg total) by mouth every 6 (six) hours as needed. 30 tablet Suezette Lafave, [place] C, PA-C     Controlled Substance Prescriptions [place] Controlled Substance Registry consulted? Not Applicable   Lew Dawes, [place] 01/08/19 [phone]

## 2019-01-24 ENCOUNTER — Encounter (HOSPITAL_COMMUNITY): Payer: Self-pay | Admitting: Emergency Medicine

## 2019-01-24 ENCOUNTER — Ambulatory Visit (HOSPITAL_COMMUNITY)
Admission: EM | Admit: 2019-01-24 | Discharge: 2019-01-24 | Disposition: A | Discharge status: Home / Self Care | Payer: BLUE CROSS/BLUE SHIELD | Attending: Family Medicine | Admitting: Family Medicine

## 2019-01-24 DIAGNOSIS — M542 Cervicalgia: Secondary | ICD-10-CM

## 2019-01-24 DIAGNOSIS — S161XXA Strain of muscle, fascia and tendon at neck level, initial encounter: Secondary | ICD-10-CM

## 2019-01-24 MED ORDER — CYCLOBENZAPRINE HCL 10 MG PO TABS: 10.0000 mg | ORAL_TABLET | Freq: Every day | ORAL | 0 refills | Status: DC

## 2019-01-24 MED ORDER — NAPROXEN 375 MG PO TABS: 375.0000 mg | ORAL_TABLET | Freq: Two times a day (BID) | ORAL | 0 refills | Status: DC

## 2019-01-24 NOTE — ED Provider Notes (Signed)
[facility]   [id number] 01/24/19 Arrival Time: 1021  CC: Neck pain  SUBJECTIVE: History from: patient and family. Carolyn Ramsey is a 43 y.o. female complains of neck pain that began 2 weeks ago.  Denies a precipitating event or specific injury, but does a lot of repetitive movements like cleaning and sewing.  Localizes the pain to the posterior aspect of neck.  Describes the pain as intermittent and worse with neck flexion.  Has been treated for similar symptoms in the past with relief.  Denies fever, chills, erythema, ecchymosis, effusion, weakness, numbness and tingling.      ROS: As per HPI.  Past Medical History:  Diagnosis Date  . Hypertension   . Spinal headache    Past Surgical History:  Procedure Laterality Date  . NO PAST SURGERIES     No Known Allergies No current facility-administered medications on file prior to encounter.    Current Outpatient Medications on File Prior to Encounter  Medication Sig Dispense Refill  . amLODipine (NORVASC) 10 MG tablet Take 1 tablet (10 mg total) by mouth daily. 30 tablet 11  . butalbital-acetaminophen-caffeine (FIORICET, ESGIC) 50-325-40 MG tablet Take 1 tablet by mouth every 12 (twelve) hours as needed for headache. 30 tablet 1  . levonorgestrel (LILETTA, 52 MG,) 18.6 MCG/DAY IUD IUD 1 Intra Uterine Device (1 each total) by Intrauterine route once. 1 each 0  . verapamil (CALAN-SR) 240 MG CR tablet Take 1 tablet (240 mg total) by mouth at bedtime. 30 tablet 3   Social History   Socioeconomic History  . Marital status: Married    Spouse name: Not on file  . Number of children: Not on file  . Years of education: Not on file  . Highest education level: Not on file  Occupational History  . Not on file  Social Needs  . Financial resource strain: Not on file  . Food insecurity:    Worry: Not on file    Inability: Not on file  . Transportation needs:    Medical: Not on file    Non-medical: Not on file  Tobacco Use    . Smoking status: Never Smoker  . Smokeless tobacco: Never Used  Substance and Sexual Activity  . Alcohol use: No    Alcohol/week: 0.0 standard drinks  . Drug use: No  . Sexual activity: Yes    Birth control/protection: None  Lifestyle  . Physical activity:    Days per week: Not on file    Minutes per session: Not on file  . Stress: Not on file  Relationships  . Social connections:    Talks on phone: Not on file    Gets together: Not on file    Attends religious service: Not on file    Active member of club or organization: Not on file    Attends meetings of clubs or organizations: Not on file    Relationship status: Not on file  . Intimate partner violence:    Fear of current or ex partner: Not on file    Emotionally abused: Not on file    Physically abused: Not on file    Forced sexual activity: Not on file  Other Topics Concern  . Not on file  Social History Narrative   Lives with husband and 4 children.  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    Family History  Family history unknown: Yes    OBJECTIVE:  Vitals:   01/24/19 1034  BP: (!) 141/94  Pulse: 96  Resp: 16  Temp: 98.6 F (37 C)  SpO2: 100%    General appearance: Alert; in no acute distress.  Head: NCAT Lungs: CTA bilaterally Heart: RRR.   Musculoskeletal: neck Inspection: Skin warm, dry, clear and intact without obvious erythema, effusion, or ecchymosis.  Palpation: TTP over posterior neck, and RT cervical musculature ROM: FROM active and passive Strength: 5/5 shld abduction, 5/5 shld adduction, 5/5 elbow flexion, 5/5 elbow extension, 5/5 grip strength Skin: warm and dry Neurologic: Ambulates without difficulty; Sensation intact about the upper extremities Psychological: alert and cooperative; normal mood and affect  ASSESSMENT & PLAN:  1. Neck pain   2. Acute strain of neck muscle, initial encounter     Meds ordered this encounter  Medications  . naproxen (NAPROSYN) 375  MG tablet    Sig: Take 1 tablet (375 mg total) by mouth 2 (two) times daily.    Dispense:  20 tablet    Refill:  0    Order Specific Question:   Supervising Provider    Answer:   Eustace Moore [[id number]]  . cyclobenzaprine (FLEXERIL) 10 MG tablet    Sig: Take 1 tablet (10 mg total) by mouth at bedtime.    Dispense:  12 tablet    Refill:  0    Order Specific Question:   Supervising Provider    Answer:   Eustace Moore [[id number]]    Continue conservative management of rest, ice, heat, and gentle massage.  Avoid painful activities Take naproxen as needed for pain relief (may cause abdominal discomfort, ulcers, and GI bleeds avoid taking with other NSAIDs) Take cyclobenzaprine at nighttime for symptomatic relief. Avoid driving or operating heavy machinery while using medication. Follow up with PCP if symptoms persist Return or go to the ER if you have any new or worsening symptoms (fever, chills, chest pain, worsening pain, redness or swelling, numbness or tingling in extremities, etc...)   Reviewed expectations re: course of current medical issues. Questions answered. Outlined signs and symptoms indicating need for more acute intervention. Patient verbalized understanding. After Visit Summary given.    Rennis Harding, PA-C 01/24/19 1133

## 2019-01-24 NOTE — ED Triage Notes (Signed)
Pt c/o Neck pain x 2 weeks

## 2019-01-24 NOTE — Discharge Instructions (Signed)
Continue conservative management of rest, ice, heat, and gentle massage.  Avoid painful activities Take naproxen as needed for pain relief (may cause abdominal discomfort, ulcers, and GI bleeds avoid taking with other NSAIDs) Take cyclobenzaprine at nighttime for symptomatic relief. Avoid driving or operating heavy machinery while using medication. Follow up with PCP if symptoms persist Return or go to the ER if you have any new or worsening symptoms (fever, chills, chest pain, worsening pain, redness or swelling, numbness or tingling in extremities, etc...)

## 2019-02-02 ENCOUNTER — Ambulatory Visit: Payer: BLUE CROSS/BLUE SHIELD | Admitting: Family Medicine

## 2019-02-15 ENCOUNTER — Other Ambulatory Visit: Payer: Self-pay

## 2019-02-15 ENCOUNTER — Ambulatory Visit: Payer: BLUE CROSS/BLUE SHIELD | Attending: Family Medicine | Admitting: Family Medicine

## 2019-02-15 ENCOUNTER — Encounter: Payer: Self-pay | Admitting: Family Medicine

## 2019-02-15 VITALS — BP 134/88 | HR 92 | Temp 97.8°F | Ht 67.0 in | Wt 223.6 lb

## 2019-02-15 DIAGNOSIS — R51 Headache: Secondary | ICD-10-CM

## 2019-02-15 DIAGNOSIS — I10 Essential (primary) hypertension: Secondary | ICD-10-CM

## 2019-02-15 DIAGNOSIS — R519 Headache, unspecified: Secondary | ICD-10-CM

## 2019-02-15 DIAGNOSIS — M542 Cervicalgia: Secondary | ICD-10-CM

## 2019-02-15 MED ORDER — BUTALBITAL-APAP-CAFFEINE 50-325-40 MG PO TABS: 1.0000 | ORAL_TABLET | Freq: Two times a day (BID) | ORAL | 1 refills | Status: DC | PRN

## 2019-02-15 MED ORDER — CYCLOBENZAPRINE HCL 10 MG PO TABS
10.0000 mg | ORAL_TABLET | Freq: Every day | ORAL | 1 refills | Status: DC
Start: 2019-02-15 — End: 2019-05-18

## 2019-02-15 NOTE — Patient Instructions (Signed)
Musculoskeletal Pain  Musculoskeletal pain refers to aches and pains in your bones, joints, muscles, and the tissues that surround them. This pain can occur in any part of the body. It can last for a short time (acute) or a long time (chronic).  A physical exam, lab tests, and imaging studies may be done to find the cause of your musculoskeletal pain.  Follow these instructions at home:    Lifestyle   Try to control or lower your stress levels. Stress increases muscle tension and can worsen musculoskeletal pain. It is important to recognize when you are anxious or stressed and learn ways to manage it. This may include:  ? Meditation or yoga.  ? Cognitive or behavioral therapy.  ? Acupuncture or massage therapy.   You may continue all activities unless the activities cause more pain. When the pain gets better, slowly resume your normal activities. Gradually increase the intensity and duration of your activities or exercise.  Managing pain, stiffness, and swelling   Take over-the-counter and prescription medicines only as told by your health care provider.   When your pain is severe, bed rest may be helpful. Lie or sit in any position that is comfortable, but get out of bed and walk around at least every couple of hours.   If directed, apply heat to the affected area as often as told by your health care provider. Use the heat source that your health care provider recommends, such as a moist heat pack or a heating pad.  ? Place a towel between your skin and the heat source.  ? Leave the heat on for 20-30 minutes.  ? Remove the heat if your skin turns bright red. This is especially important if you are unable to feel pain, heat, or cold. You may have a greater risk of getting burned.   If directed, put ice on the painful area.  ? Put ice in a plastic bag.  ? Place a towel between your skin and the bag.  ? Leave the ice on for 20 minutes, 2-3 times a day.  General instructions   Your health care provider may  recommend that you see a physical therapist. This person can help you come up with a safe exercise program. Do any exercises as told by your physical therapist.   Keep all follow-up visits, including any physical therapy visits, as told by your health care providers. This is important.  Contact a health care provider if:   Your pain gets worse.   Medicines do not help ease your pain.   You cannot use the part of your body that hurts, such as your arm, leg, or neck.   You have trouble sleeping.   You have trouble doing your normal activities.  Get help right away if:   You have a new injury and your pain is worse or different.   You feel numb or you have tingling in the painful area.  Summary   Musculoskeletal pain refers to aches and pains in your bones, joints, muscles, and the tissues that surround them.   This pain can occur in any part of the body.   Your health care provider may recommend that you see a physical therapist. This person can help you come up with a safe exercise program. Do any exercises as told by your physical therapist.   Lower your stress level. Stress can worsen musculoskeletal pain. Ways to lower stress may include meditation, yoga, cognitive or behavioral therapy, acupuncture, and massage   therapy.  This information is not intended to replace advice given to you by your health care provider. Make sure you discuss any questions you have with your health care provider.  Document Released: 11/16/2005 Document Revised: 12/16/2016 Document Reviewed: 12/16/2016  Elsevier Interactive Patient Education  2019 Elsevier Inc[employer]

## 2019-02-15 NOTE — Progress Notes (Signed)
Subjective:  Patient ID: Carolyn Ramsey, female    DOB: 11/15/1976  Age: 43 y.o. MRN: [id number]  CC: Hypertension   HPI Carolyn Giovannibttsam Guttierrez is a 43 year old female with a history of hypertension, and chronic headaches who presents for follow-up visit. Her headaches have improved and she has them just twice a week and has not used Fioricet in a while. Compliant with her antihypertensive. She complains of a one-month history of posterior neck pain for which she was seen at the ED and placed on Flexeril.  She gives a history of crotcheting and bends her neck a lot. Denies numbness or tingling down her arms and no reduction in strength of both upper extremities.  Past Medical History:  Diagnosis Date  . Hypertension   . Spinal headache     Past Surgical History:  Procedure Laterality Date  . NO PAST SURGERIES      Family History  Family history unknown: Yes    No Known Allergies  Outpatient Medications Prior to Visit  Medication Sig Dispense Refill  . amLODipine (NORVASC) 10 MG tablet Take 1 tablet (10 mg total) by mouth daily. 30 tablet 11  . levonorgestrel (LILETTA, 52 MG,) 18.6 MCG/DAY IUD IUD 1 Intra Uterine Device (1 each total) by Intrauterine route once. 1 each 0  . naproxen (NAPROSYN) 375 MG tablet Take 1 tablet (375 mg total) by mouth 2 (two) times daily. (Patient not taking: Reported on 02/15/2019) 20 tablet 0  . butalbital-acetaminophen-caffeine (FIORICET, ESGIC) 50-325-40 MG tablet Take 1 tablet by mouth every 12 (twelve) hours as needed for headache. (Patient not taking: Reported on 02/15/2019) 30 tablet 1  . cyclobenzaprine (FLEXERIL) 10 MG tablet Take 1 tablet (10 mg total) by mouth at bedtime. (Patient not taking: Reported on 02/15/2019) 12 tablet 0  . verapamil (CALAN-SR) 240 MG CR tablet Take 1 tablet (240 mg total) by mouth at bedtime. (Patient not taking: Reported on 02/15/2019) 30 tablet 3   No facility-administered medications prior to visit.      ROS Review  of Systems  Constitutional: Negative for activity change, appetite change and fatigue.  HENT: Negative for congestion, sinus pressure and sore throat.   Eyes: Negative for visual disturbance.  Respiratory: Negative for cough, chest tightness, shortness of breath and wheezing.   Cardiovascular: Negative for chest pain and palpitations.  Gastrointestinal: Negative for abdominal distention, abdominal pain and constipation.  Endocrine: Negative for polydipsia.  Genitourinary: Negative for dysuria and frequency.  Musculoskeletal: Positive for neck pain. Negative for arthralgias and back pain.  Skin: Negative for rash.  Neurological: Negative for tremors, light-headedness and numbness.  Hematological: Does not bruise/bleed easily.  Psychiatric/Behavioral: Negative for agitation and behavioral problems.    Objective:  BP 134/88   Pulse 92   Temp 97.8 F (36.6 C) (Oral)   Ht 5\' 7"  (1.702 m)   Wt 223 lb 9.6 oz (101.4 kg)   SpO2 100%   BMI 35.02 kg/m   BP/Weight 02/15/2019 01/24/2019 01/07/2019  Systolic BP 134 141 143  Diastolic BP 88 94 90  Wt. (Lbs) 223.6 - -  BMI 35.02 - -      Physical Exam Constitutional:      Appearance: She is well-developed.  Cardiovascular:     Rate and Rhythm: Normal rate.     Heart sounds: Normal heart sounds. No murmur.  Pulmonary:     Effort: Pulmonary effort is normal.     Breath sounds: Normal breath sounds. No wheezing or rales.  Chest:  Chest wall: No tenderness.  Abdominal:     General: Bowel sounds are normal. There is no distension.     Palpations: Abdomen is soft. There is no mass.     Tenderness: There is no abdominal tenderness.  Musculoskeletal: Normal range of motion.     Comments: Slight tenderness on palpation of C7  Neurological:     Mental Status: She is alert and oriented to person, place, and time.     Comments: Normal handgrip bilaterally     CMP Latest Ref Rng & Units 03/30/2018 11/16/2017 03/25/2017  Glucose 65 - 99  mg/dL 89 91 86  BUN 6 - 24 mg/dL 8 12 11   Creatinine 0.57 - 1.00 mg/dL 2.01(E) 0.71(Q) 1.97(J)  Sodium 134 - 144 mmol/L 141 143 140  Potassium 3.5 - 5.2 mmol/L 3.9 4.2 4.0  Chloride 96 - 106 mmol/L 106 105 101  CO2 20 - 29 mmol/L 22 24 23   Calcium 8.7 - 10.2 mg/dL 8.9 9.2 9.4  Total Protein 6.0 - 8.5 g/dL 6.9 - 7.2  Total Bilirubin 0.0 - 1.2 mg/dL 0.3 - 0.3  Alkaline Phos 39 - 117 IU/L 79 - 80  AST 0 - 40 IU/L 10 - 15  ALT 0 - 32 IU/L 11 - 13    Lipid Panel     Component Value Date/Time   CHOL 177 03/30/2018 0938   TRIG 40 03/30/2018 0938   HDL 67 03/30/2018 0938   CHOLHDL 2.6 03/30/2018 0938   CHOLHDL 3.1 03/05/2016 1018   VLDL 9 03/05/2016 1018   LDLCALC 102 (H) 03/30/2018 0938    CBC    Component Value Date/Time   WBC 6.6 03/25/2017 1127   WBC 9.4 03/03/2017 1333   RBC 4.25 03/25/2017 1127   RBC 4.41 03/03/2017 1333   HGB 11.4 03/25/2017 1127   HCT 35.0 03/25/2017 1127   PLT 297 03/25/2017 1127   MCV 82 03/25/2017 1127   MCH 26.8 03/25/2017 1127   MCH 26.8 03/03/2017 1333   MCHC 32.6 03/25/2017 1127   MCHC 32.3 03/03/2017 1333   RDW 15.6 (H) 03/25/2017 1127   LYMPHSABS 2.3 03/25/2017 1127   MONOABS 0.8 03/03/2017 1333   EOSABS 0.2 03/25/2017 1127   BASOSABS 0.0 03/25/2017 1127    Lab Results  Component Value Date   HGBA1C 5.6 (A) 06/28/2018    Assessment & Plan:   1. Nonintractable episodic headache, unspecified headache type Frequency of headaches have improved No indication for Topamax but will continue with Fioricet - butalbital-acetaminophen-caffeine (FIORICET, ESGIC) 50-325-40 MG tablet; Take 1 tablet by mouth every 12 (twelve) hours as needed for headache.  Dispense: 30 tablet; Refill: 1  2. Essential hypertension Controlled Counseled on blood pressure goal of less than 130/80, low-sodium, DASH diet, medication compliance, 150 minutes of moderate intensity exercise per week. Discussed medication compliance, adverse effects. - Basic Metabolic  Panel  3. Neck pain Musculoskeletal -could also be due to posture during repeated crochet activities Advised to apply heat - cyclobenzaprine (FLEXERIL) 10 MG tablet; Take 1 tablet (10 mg total) by mouth at bedtime.  Dispense: 30 tablet; Refill: 1   Meds ordered this encounter  Medications  . cyclobenzaprine (FLEXERIL) 10 MG tablet    Sig: Take 1 tablet (10 mg total) by mouth at bedtime.    Dispense:  30 tablet    Refill:  1  . butalbital-acetaminophen-caffeine (FIORICET, ESGIC) 50-325-40 MG tablet    Sig: Take 1 tablet by mouth every 12 (twelve) hours as needed for  headache.    Dispense:  30 tablet    Refill:  1    Follow-up: Return in about 3 months (around 05/18/2019) for follow up of chronic medical conditions.       Hoy Register, MD, FAAFP. [facility] [place], [place] [phone]   02/15/2019, 11:37 AM

## 2019-02-15 NOTE — Progress Notes (Signed)
Neck pain

## 2019-02-16 LAB — BASIC METABOLIC PANEL
BUN/Creatinine Ratio: 18 (ref 9–23)
BUN: 9 mg/dL (ref 6–24)
CALCIUM: 9.4 mg/dL (ref 8.7–10.2)
CO2: 18 mmol/L — AB (ref 20–29)
Chloride: 105 mmol/L (ref 96–106)
Creatinine, Ser: 0.51 mg/dL — ABNORMAL LOW (ref 0.57–1.00)
GFR calc Af Amer: 138 mL/min/{1.73_m2} (ref >59)
GFR, EST NON AFRICAN AMERICAN: 120 mL/min/{1.73_m2} (ref >59)
GLUCOSE: 86 mg/dL (ref 65–99)
POTASSIUM: 3.9 mmol/L (ref 3.5–5.2)
SODIUM: 141 mmol/L (ref 134–144)

## 2019-02-17 ENCOUNTER — Telehealth: Payer: Self-pay

## 2019-02-17 NOTE — Telephone Encounter (Signed)
-----   Message from Hoy Register, MD sent at 02/16/2019  8:57 PM EDT ----- Please inform the patient that labs are normal. Thank you.

## 2019-02-17 NOTE — Telephone Encounter (Signed)
Patient was called and a voicemail was left informing patient to return phone call for lab results. 

## 2019-02-20 NOTE — Telephone Encounter (Signed)
-----   Message from Enobong Newlin, MD sent at 02/16/2019  8:57 PM EDT ----- Please inform the patient that labs are normal. Thank you. 

## 2019-02-20 NOTE — Telephone Encounter (Signed)
Patient name and DOB has been verified Patient was informed of lab results. Patient had no questions.  

## 2019-03-13 MED FILL — AMLODIPINE BESYLATE 10 MG T: 10 | 30 days supply | Qty: 30 | Fill #0

## 2019-04-07 MED FILL — BUTALB-ACETAMIN-CAFF 50-325: 50-325-40 | 15 days supply | Qty: 30 | Fill #0

## 2019-04-19 ENCOUNTER — Other Ambulatory Visit: Payer: Self-pay | Admitting: *Deleted

## 2019-04-19 MED ORDER — AMLODIPINE BESYLATE 10 MG PO TABS: 10.0000 mg | ORAL_TABLET | Freq: Every day | ORAL | 3 refills | Status: DC

## 2019-04-19 MED FILL — AMLODIPINE BESYLATE 10 MG T: 10 | 30 days supply | Qty: 30 | Fill #0

## 2019-05-18 ENCOUNTER — Ambulatory Visit: Payer: BLUE CROSS/BLUE SHIELD | Attending: Family Medicine | Admitting: Family Medicine

## 2019-05-18 ENCOUNTER — Other Ambulatory Visit: Payer: Self-pay

## 2019-05-18 ENCOUNTER — Encounter: Payer: Self-pay | Admitting: Family Medicine

## 2019-05-18 VITALS — BP 141/91 | HR 96 | Temp 98.5°F | Ht 67.0 in | Wt 220.0 lb

## 2019-05-18 DIAGNOSIS — E6609 Other obesity due to excess calories: Secondary | ICD-10-CM | POA: Diagnosis not present

## 2019-05-18 DIAGNOSIS — R51 Headache: Secondary | ICD-10-CM

## 2019-05-18 DIAGNOSIS — M542 Cervicalgia: Secondary | ICD-10-CM

## 2019-05-18 DIAGNOSIS — R519 Headache, unspecified: Secondary | ICD-10-CM

## 2019-05-18 DIAGNOSIS — E669 Obesity, unspecified: Secondary | ICD-10-CM | POA: Insufficient documentation

## 2019-05-18 DIAGNOSIS — Z6834 Body mass index (BMI) 34.0-34.9, adult: Secondary | ICD-10-CM

## 2019-05-18 DIAGNOSIS — I10 Essential (primary) hypertension: Secondary | ICD-10-CM

## 2019-05-18 MED ORDER — CYCLOBENZAPRINE HCL 10 MG PO TABS: 10.0000 mg | ORAL_TABLET | Freq: Every day | ORAL | 1 refills | Status: DC

## 2019-05-18 MED ORDER — BUTALBITAL-APAP-CAFFEINE 50-325-40 MG PO TABS: 1.0000 | ORAL_TABLET | Freq: Two times a day (BID) | ORAL | 1 refills | Status: DC | PRN

## 2019-05-18 MED ORDER — AMLODIPINE BESYLATE 10 MG PO TABS: 10.0000 mg | ORAL_TABLET | Freq: Every day | ORAL | 6 refills | Status: DC

## 2019-05-18 MED FILL — BUTALBITAL-APAP-CAFFEINE 50: 50-325-40 | 15 days supply | Qty: 30 | Fill #0

## 2019-05-18 MED FILL — AMLODIPINE BESYLATE 10 MG T: 10 | 30 days supply | Qty: 30 | Fill #0

## 2019-05-18 MED FILL — CYCLOBENZAPRINE HCL 10 MG T: 10 | 30 days supply | Qty: 30 | Fill #0

## 2019-05-18 NOTE — Progress Notes (Signed)
Subjective:  Patient ID: Carolyn Ramsey, female    DOB: [birth year]  Age: 43 y.o. MRN: [id number]  CC: Hypertension   HPI Carolyn Ramsey is a 43 year old female with a history of hypertension, and chronic headaches who presents for follow-up visit. Headaches occur every 2nd-3rd day and she uses Fioricet sparingly.  Denies photophobia, nausea or vomiting and reports doing well on Fioricet. She has been compliant with her antihypertensive and denies chest pains, pedal edema. Over the last 2 days she has felt an intermittent discomfort on the right side of her neck but no sore throat, no fever or other sinus symptoms.  Endorses exercising by means of walking but not with the goal of weight loss as she has never thought about diet.  She thinks her portion sizes are adequate but she is open to measures to prevent weight loss.  Past Medical History:  Diagnosis Date  . Hypertension   . Spinal headache     Past Surgical History:  Procedure Laterality Date  . NO PAST SURGERIES      Family History  Family history unknown: Yes    No Known Allergies  Outpatient Medications Prior to Visit  Medication Sig Dispense Refill  . levonorgestrel (LILETTA, 52 MG,) 18.6 MCG/DAY IUD IUD 1 Intra Uterine Device (1 each total) by Intrauterine route once. 1 each 0  . amLODipine (NORVASC) 10 MG tablet Take 1 tablet (10 mg total) by mouth daily. 30 tablet 3  . butalbital-acetaminophen-caffeine (FIORICET, ESGIC) 50-325-40 MG tablet Take 1 tablet by mouth every 12 (twelve) hours as needed for headache. 30 tablet 1  . cyclobenzaprine (FLEXERIL) 10 MG tablet Take 1 tablet (10 mg total) by mouth at bedtime. 30 tablet 1  . naproxen (NAPROSYN) 375 MG tablet Take 1 tablet (375 mg total) by mouth 2 (two) times daily. (Patient not taking: Reported on 02/15/2019) 20 tablet 0   No facility-administered medications prior to visit.      ROS Review of Systems  Constitutional: Negative for activity change, appetite  change and fatigue.  HENT: Negative for congestion, sinus pressure and sore throat.   Eyes: Negative for visual disturbance.  Respiratory: Negative for cough, chest tightness, shortness of breath and wheezing.   Cardiovascular: Negative for chest pain and palpitations.  Gastrointestinal: Negative for abdominal distention, abdominal pain and constipation.  Endocrine: Negative for polydipsia.  Genitourinary: Negative for dysuria and frequency.  Musculoskeletal: Negative for arthralgias and back pain.  Skin: Negative for rash.  Neurological: Negative for tremors, light-headedness and numbness.  Hematological: Does not bruise/bleed easily.  Psychiatric/Behavioral: Negative for agitation and behavioral problems.    Objective:  BP (!) 141/91   Pulse 96   Temp 98.5 F (36.9 C) (Oral)   Ht 5' 7"  (1.702 m)   Wt 220 lb (99.8 kg)   SpO2 100%   BMI 34.46 kg/m   BP/Weight 05/18/2019 02/15/2019 4/85/4627  Systolic BP 035 009 381  Diastolic BP 91 88 94  Wt. (Lbs) 220 223.6 -  BMI 34.46 35.02 -      Physical Exam Constitutional:      Appearance: She is well-developed.  Cardiovascular:     Rate and Rhythm: Normal rate.     Heart sounds: Normal heart sounds. No murmur.  Pulmonary:     Effort: Pulmonary effort is normal.     Breath sounds: Normal breath sounds. No wheezing or rales.  Chest:     Chest wall: No tenderness.  Abdominal:     General: Bowel sounds are  normal. There is no distension.     Palpations: Abdomen is soft. There is no mass.     Tenderness: There is no abdominal tenderness.  Musculoskeletal: Normal range of motion.  Neurological:     Mental Status: She is alert and oriented to person, place, and time.  Psychiatric:        Mood and Affect: Mood normal.        Behavior: Behavior normal.     CMP Latest Ref Rng & Units 02/15/2019 03/30/2018 11/16/2017  Glucose 65 - 99 mg/dL 86 89 91  BUN 6 - 24 mg/dL 9 8 12   Creatinine 0.57 - 1.00 mg/dL 0.51(L) 0.44(L) 0.43(L)   Sodium 134 - 144 mmol/L 141 141 143  Potassium 3.5 - 5.2 mmol/L 3.9 3.9 4.2  Chloride 96 - 106 mmol/L 105 106 105  CO2 20 - 29 mmol/L 18(L) 22 24  Calcium 8.7 - 10.2 mg/dL 9.4 8.9 9.2  Total Protein 6.0 - 8.5 g/dL - 6.9 -  Total Bilirubin 0.0 - 1.2 mg/dL - 0.3 -  Alkaline Phos 39 - 117 IU/L - 79 -  AST 0 - 40 IU/L - 10 -  ALT 0 - 32 IU/L - 11 -    Lipid Panel     Component Value Date/Time   CHOL 177 03/30/2018 0938   TRIG 40 03/30/2018 0938   HDL 67 03/30/2018 0938   CHOLHDL 2.6 03/30/2018 0938   CHOLHDL 3.1 03/05/2016 1018   VLDL 9 03/05/2016 1018   LDLCALC 102 (H) 03/30/2018 0938    CBC    Component Value Date/Time   WBC 6.6 03/25/2017 1127   WBC 9.4 03/03/2017 1333   RBC 4.25 03/25/2017 1127   RBC 4.41 03/03/2017 1333   HGB 11.4 03/25/2017 1127   HCT 35.0 03/25/2017 1127   PLT 297 03/25/2017 1127   MCV 82 03/25/2017 1127   MCH 26.8 03/25/2017 1127   MCH 26.8 03/03/2017 1333   MCHC 32.6 03/25/2017 1127   MCHC 32.3 03/03/2017 1333   RDW 15.6 (H) 03/25/2017 1127   LYMPHSABS 2.3 03/25/2017 1127   MONOABS 0.8 03/03/2017 1333   EOSABS 0.2 03/25/2017 1127   BASOSABS 0.0 03/25/2017 1127    Lab Results  Component Value Date   HGBA1C 5.6 (A) 06/28/2018    Assessment & Plan:   1. Neck pain Mitten muscle spasms - cyclobenzaprine (FLEXERIL) 10 MG tablet; Take 1 tablet (10 mg total) by mouth at bedtime.  Dispense: 30 tablet; Refill: 1  2. Nonintractable episodic headache, unspecified headache type Headaches are infrequent-she uses Fioricet sparingly We have discussed possibility of dependence but given infrequent use I will hold off and consider initiating Topamax if frequency increases - butalbital-acetaminophen-caffeine (FIORICET) 50-325-40 MG tablet; Take 1 tablet by mouth every 12 (twelve) hours as needed for headache.  Dispense: 30 tablet; Refill: 1  3. Class 1 obesity due to excess calories without serious comorbidity with body mass index (BMI) of 34.0 to  34.9 in adult Discussed reducing portion sizes, increasing physical activity  4. Essential hypertension Controlled Counseled on blood pressure goal of less than 130/80, low-sodium, DASH diet, medication compliance, 150 minutes of moderate intensity exercise per week. Discussed medication compliance, adverse effects. - amLODipine (NORVASC) 10 MG tablet; Take 1 tablet (10 mg total) by mouth daily.  Dispense: 30 tablet; Refill: 6 - CMP14+EGFR - Lipid panel   Meds ordered this encounter  Medications  . amLODipine (NORVASC) 10 MG tablet    Sig: Take 1 tablet (10  mg total) by mouth daily.    Dispense:  30 tablet    Refill:  6  . cyclobenzaprine (FLEXERIL) 10 MG tablet    Sig: Take 1 tablet (10 mg total) by mouth at bedtime.    Dispense:  30 tablet    Refill:  1  . butalbital-acetaminophen-caffeine (FIORICET) 50-325-40 MG tablet    Sig: Take 1 tablet by mouth every 12 (twelve) hours as needed for headache.    Dispense:  30 tablet    Refill:  1    Follow-up: Return in about 3 months (around 08/18/2019) for complete physical exam.       Charlott Rakes, MD, FAAFP. [facility] [place], [place]   05/18/2019, 9:02 AM

## 2019-05-19 LAB — CMP14+EGFR
ALT: 14 IU/L (ref 0–32)
AST: 16 IU/L (ref 0–40)
Albumin/Globulin Ratio: 1.5 (ref 1.2–2.2)
Albumin: 4.3 g/dL (ref 3.8–4.8)
Alkaline Phosphatase: 66 IU/L (ref 39–117)
BUN/Creatinine Ratio: 22 (ref 9–23)
BUN: 9 mg/dL (ref 6–24)
Bilirubin Total: <0.2 mg/dL (ref 0.0–1.2)
CO2: 21 mmol/L (ref 20–29)
Calcium: 8.8 mg/dL (ref 8.7–10.2)
Chloride: 104 mmol/L (ref 96–106)
Creatinine, Ser: 0.41 mg/dL — ABNORMAL LOW (ref 0.57–1.00)
GFR calc Af Amer: 148 mL/min/{1.73_m2} (ref >59)
GFR calc non Af Amer: 129 mL/min/{1.73_m2} (ref >59)
Globulin, Total: 2.8 g/dL (ref 1.5–4.5)
Glucose: 90 mg/dL (ref 65–99)
Potassium: 3.8 mmol/L (ref 3.5–5.2)
Sodium: 140 mmol/L (ref 134–144)
Total Protein: 7.1 g/dL (ref 6.0–8.5)

## 2019-05-19 LAB — LIPID PANEL
Chol/HDL Ratio: 2.7 ratio (ref 0.0–4.4)
Cholesterol, Total: 201 mg/dL — ABNORMAL HIGH (ref 100–199)
HDL: 75 mg/dL (ref >39)
LDL Calculated: 114 mg/dL — ABNORMAL HIGH (ref 0–99)
Triglycerides: 62 mg/dL (ref 0–149)
VLDL Cholesterol Cal: 12 mg/dL (ref 5–40)

## 2019-06-20 MED FILL — BUTALBITAL-APAP-CAFFEINE 50: 50-325-40 | 15 days supply | Qty: 30 | Fill #1

## 2019-06-20 MED FILL — AMLODIPINE BESYLATE 10 MG T: 10 | 30 days supply | Qty: 30 | Fill #1

## 2019-07-21 ENCOUNTER — Other Ambulatory Visit: Payer: Self-pay | Admitting: Family Medicine

## 2019-07-21 DIAGNOSIS — R519 Headache, unspecified: Secondary | ICD-10-CM

## 2019-07-21 MED FILL — AMLODIPINE BESYLATE 10 MG T: 10 | 30 days supply | Qty: 30 | Fill #2

## 2019-07-21 MED FILL — CYCLOBENZAPRINE HCL 10 MG T: 10 | 30 days supply | Qty: 30 | Fill #1

## 2019-07-25 MED FILL — BUTALBITAL-APAP-CAFFEINE 50: 50-325-40 | 15 days supply | Qty: 30 | Fill #0

## 2019-08-16 MED FILL — AMLODIPINE BESYLATE 10 MG T: 10 | 30 days supply | Qty: 30 | Fill #3

## 2019-08-21 ENCOUNTER — Ambulatory Visit: Payer: BLUE CROSS/BLUE SHIELD | Attending: Family Medicine | Admitting: Family Medicine

## 2019-08-21 ENCOUNTER — Other Ambulatory Visit: Payer: Self-pay

## 2019-08-21 ENCOUNTER — Encounter: Payer: Self-pay | Admitting: Family Medicine

## 2019-08-21 VITALS — BP 138/89 | HR 104 | Temp 98.5°F | Ht 67.0 in | Wt 231.4 lb

## 2019-08-21 DIAGNOSIS — N939 Abnormal uterine and vaginal bleeding, unspecified: Secondary | ICD-10-CM

## 2019-08-21 DIAGNOSIS — Z124 Encounter for screening for malignant neoplasm of cervix: Secondary | ICD-10-CM

## 2019-08-21 DIAGNOSIS — R51 Headache: Secondary | ICD-10-CM

## 2019-08-21 DIAGNOSIS — Z131 Encounter for screening for diabetes mellitus: Secondary | ICD-10-CM

## 2019-08-21 DIAGNOSIS — Z0001 Encounter for general adult medical examination with abnormal findings: Secondary | ICD-10-CM

## 2019-08-21 DIAGNOSIS — Z1239 Encounter for other screening for malignant neoplasm of breast: Secondary | ICD-10-CM

## 2019-08-21 DIAGNOSIS — Z0000 Encounter for general adult medical examination without abnormal findings: Secondary | ICD-10-CM

## 2019-08-21 DIAGNOSIS — Z23 Encounter for immunization: Secondary | ICD-10-CM

## 2019-08-21 DIAGNOSIS — N938 Other specified abnormal uterine and vaginal bleeding: Secondary | ICD-10-CM | POA: Diagnosis not present

## 2019-08-21 DIAGNOSIS — R519 Headache, unspecified: Secondary | ICD-10-CM

## 2019-08-21 LAB — POCT GLYCOSYLATED HEMOGLOBIN (HGB A1C): HbA1c, POC (controlled diabetic range): 5.9 % (ref 0.0–7.0)

## 2019-08-21 MED ORDER — BUTALBITAL-APAP-CAFFEINE 50-325-40 MG PO TABS: 1.0000 | ORAL_TABLET | Freq: Two times a day (BID) | ORAL | 1 refills | Status: DC | PRN

## 2019-08-21 NOTE — Patient Instructions (Signed)
Health Maintenance, Female Adopting a healthy lifestyle and getting preventive care are important in promoting health and wellness. Ask your health care provider about:  The right schedule for you to have regular tests and exams.  Things you can do on your own to prevent diseases and keep yourself healthy. What should I know about diet, weight, and exercise? Eat a healthy diet   Eat a diet that includes plenty of vegetables, fruits, low-fat dairy products, and lean protein.  Do not eat a lot of foods that are high in solid fats, added sugars, or sodium. Maintain a healthy weight Body mass index (BMI) is used to identify weight problems. It estimates body fat based on height and weight. Your health care provider can help determine your BMI and help you achieve or maintain a healthy weight. Get regular exercise Get regular exercise. This is one of the most important things you can do for your health. Most adults should:  Exercise for at least 150 minutes each week. The exercise should increase your heart rate and make you sweat (moderate-intensity exercise).  Do strengthening exercises at least twice a week. This is in addition to the moderate-intensity exercise.  Spend less time sitting. Even light physical activity can be beneficial. Watch cholesterol and blood lipids Have your blood tested for lipids and cholesterol at 41 years of age, then have this test every 5 years. Have your cholesterol levels checked more often if:  Your lipid or cholesterol levels are high.  You are older than 40 years of age.  You are at high risk for heart disease. What should I know about cancer screening? Depending on your health history and family history, you may need to have cancer screening at various ages. This may include screening for:  Breast cancer.  Cervical cancer.  Colorectal cancer.  Skin cancer.  Lung cancer. What should I know about heart disease, diabetes, and high blood  pressure? Blood pressure and heart disease  High blood pressure causes heart disease and increases the risk of stroke. This is more likely to develop in people who have high blood pressure readings, are of African descent, or are overweight.  Have your blood pressure checked: ? Every 3-5 years if you are 18-39 years of age. ? Every year if you are 40 years old or older. Diabetes Have regular diabetes screenings. This checks your fasting blood sugar level. Have the screening done:  Once every three years after age 40 if you are at a normal weight and have a low risk for diabetes.  More often and at a younger age if you are overweight or have a high risk for diabetes. What should I know about preventing infection? Hepatitis B If you have a higher risk for hepatitis B, you should be screened for this virus. Talk with your health care provider to find out if you are at risk for hepatitis B infection. Hepatitis C Testing is recommended for:  Everyone born from 1945 through 1965.  Anyone with known risk factors for hepatitis C. Sexually transmitted infections (STIs)  Get screened for STIs, including gonorrhea and chlamydia, if: ? You are sexually active and are younger than 41 years of age. ? You are older than 41 years of age and your health care provider tells you that you are at risk for this type of infection. ? Your sexual activity has changed since you were last screened, and you are at increased risk for chlamydia or gonorrhea. Ask your health care provider if   you are at risk.  Ask your health care provider about whether you are at high risk for HIV. Your health care provider may recommend a prescription medicine to help prevent HIV infection. If you choose to take medicine to prevent HIV, you should first get tested for HIV. You should then be tested every 3 months for as long as you are taking the medicine. Pregnancy  If you are about to stop having your period (premenopausal) and  you may become pregnant, seek counseling before you get pregnant.  Take 400 to 800 micrograms (mcg) of folic acid every day if you become pregnant.  Ask for birth control (contraception) if you want to prevent pregnancy. Osteoporosis and menopause Osteoporosis is a disease in which the bones lose minerals and strength with aging. This can result in bone fractures. If you are 65 years old or older, or if you are at risk for osteoporosis and fractures, ask your health care provider if you should:  Be screened for bone loss.  Take a calcium or vitamin D supplement to lower your risk of fractures.  Be given hormone replacement therapy (HRT) to treat symptoms of menopause. Follow these instructions at home: Lifestyle  Do not use any products that contain nicotine or tobacco, such as cigarettes, e-cigarettes, and chewing tobacco. If you need help quitting, ask your health care provider.  Do not use street drugs.  Do not share needles.  Ask your health care provider for help if you need support or information about quitting drugs. Alcohol use  Do not drink alcohol if: ? Your health care provider tells you not to drink. ? You are pregnant, may be pregnant, or are planning to become pregnant.  If you drink alcohol: ? Limit how much you use to 0-1 drink a day. ? Limit intake if you are breastfeeding.  Be aware of how much alcohol is in your drink. In the [place] one drink equals one 12 oz bottle of beer (355 mL), one 5 oz glass of wine (148 mL), or one 1 oz glass of hard liquor (44 mL). General instructions  Schedule regular health, dental, and eye exams.  Stay current with your vaccines.  Tell your health care provider if: ? You often feel depressed. ? You have ever been abused or do not feel safe at home. Summary  Adopting a healthy lifestyle and getting preventive care are important in promoting health and wellness.  Follow your health care provider's instructions about healthy  diet, exercising, and getting tested or screened for diseases.  Follow your health care provider's instructions on monitoring your cholesterol and blood pressure. This information is not intended to replace advice given to you by your health care provider. Make sure you discuss any questions you have with your health care provider. Document Released: 06/01/2011 Document Revised: 11/09/2018 Document Reviewed: 11/09/2018 Elsevier Patient Education  2020 Elsevier Inc.  

## 2019-08-21 NOTE — Progress Notes (Signed)
Subjective:  Patient ID: Carolyn Ramsey, female    DOB: 06/27/1976  Age: 43 y.o. MRN: [id number]  CC: Annual Exam and Gynecologic Exam   HPI Carolyn Giovannibttsam Abernethy presents for a complete physical exam.  She has an IUD in place and complains this last month she had her period for 12 days which is longer than the usual 5 days but.  Has been lighter than normal.  Denies abdominal cramping. Requests refill of Fioricet.  Past Medical History:  Diagnosis Date  . Hypertension   . Spinal headache     Past Surgical History:  Procedure Laterality Date  . NO PAST SURGERIES      Family History  Family history unknown: Yes    No Known Allergies  Outpatient Medications Prior to Visit  Medication Sig Dispense Refill  . amLODipine (NORVASC) 10 MG tablet Take 1 tablet (10 mg total) by mouth daily. 30 tablet 6  . cyclobenzaprine (FLEXERIL) 10 MG tablet Take 1 tablet (10 mg total) by mouth at bedtime. 30 tablet 1  . levonorgestrel (LILETTA, 52 MG,) 18.6 MCG/DAY IUD IUD 1 Intra Uterine Device (1 each total) by Intrauterine route once. 1 each 0  . butalbital-acetaminophen-caffeine (FIORICET) 50-325-40 MG tablet TAKE 1 TABLET BY MOUTH EVERY 12 (TWELVE) HOURS AS NEEDED FOR HEADACHE. 30 tablet 1  . naproxen (NAPROSYN) 375 MG tablet Take 1 tablet (375 mg total) by mouth 2 (two) times daily. (Patient not taking: Reported on 02/15/2019) 20 tablet 0   No facility-administered medications prior to visit.      ROS Review of Systems  Constitutional: Negative for activity change, appetite change and fatigue.  HENT: Negative for congestion, sinus pressure and sore throat.   Eyes: Negative for visual disturbance.  Respiratory: Negative for cough, chest tightness, shortness of breath and wheezing.   Cardiovascular: Negative for chest pain and palpitations.  Gastrointestinal: Negative for abdominal distention, abdominal pain and constipation.  Endocrine: Negative for polydipsia.  Genitourinary: Negative for  dysuria and frequency.  Musculoskeletal: Negative for arthralgias and back pain.  Skin: Negative for rash.  Neurological: Negative for tremors, light-headedness and numbness.  Hematological: Does not bruise/bleed easily.  Psychiatric/Behavioral: Negative for agitation and behavioral problems.    Objective:  BP 138/89   Pulse (!) 104   Temp 98.5 F (36.9 C) (Oral)   Ht 5\' 7"  (1.702 m)   Wt 231 lb 6.4 oz (105 kg)   SpO2 100%   BMI 36.24 kg/m   BP/Weight 08/21/2019 05/18/2019 02/15/2019  Systolic BP 138 141 134  Diastolic BP 89 91 88  Wt. (Lbs) 231.4 220 223.6  BMI 36.24 34.46 35.02      Physical Exam Constitutional:      General: She is not in acute distress.    Appearance: She is well-developed. She is not diaphoretic.  HENT:     Head: Normocephalic.     Right Ear: External ear normal.     Left Ear: External ear normal.     Nose: Nose normal.  Eyes:     Conjunctiva/sclera: Conjunctivae normal.     Pupils: Pupils are equal, round, and reactive to light.  Neck:     Musculoskeletal: Normal range of motion.     Vascular: No JVD.  Cardiovascular:     Rate and Rhythm: Normal rate and regular rhythm.     Heart sounds: Normal heart sounds. No murmur. No gallop.   Pulmonary:     Effort: Pulmonary effort is normal. No respiratory distress.  Breath sounds: Normal breath sounds. No wheezing or rales.  Chest:     Chest wall: No tenderness.  Abdominal:     General: Bowel sounds are normal. There is no distension.     Palpations: Abdomen is soft. There is no mass.     Tenderness: There is no abdominal tenderness.  Genitourinary:    Comments: External genitalia, vagina, cervix, adnexa-normal Musculoskeletal: Normal range of motion.        General: No tenderness.  Skin:    General: Skin is warm and dry.  Neurological:     Mental Status: She is alert and oriented to person, place, and time.     Deep Tendon Reflexes: Reflexes are normal and symmetric.     CMP Latest Ref  Rng & Units 05/18/2019 02/15/2019 03/30/2018  Glucose 65 - 99 mg/dL 90 86 89  BUN 6 - 24 mg/dL 9 9 8   Creatinine 0.57 - 1.00 mg/dL 4.03(K) 7.42(V) 9.56(L)  Sodium 134 - 144 mmol/L 140 141 141  Potassium 3.5 - 5.2 mmol/L 3.8 3.9 3.9  Chloride 96 - 106 mmol/L 104 105 106  CO2 20 - 29 mmol/L 21 18(L) 22  Calcium 8.7 - 10.2 mg/dL 8.8 9.4 8.9  Total Protein 6.0 - 8.5 g/dL 7.1 - 6.9  Total Bilirubin 0.0 - 1.2 mg/dL <8.7 - 0.3  Alkaline Phos 39 - 117 IU/L 66 - 79  AST 0 - 40 IU/L 16 - 10  ALT 0 - 32 IU/L 14 - 11    Lipid Panel     Component Value Date/Time   CHOL 201 (H) 05/18/2019 1015   TRIG 62 05/18/2019 1015   HDL 75 05/18/2019 1015   CHOLHDL 2.7 05/18/2019 1015   CHOLHDL 3.1 03/05/2016 1018   VLDL 9 03/05/2016 1018   LDLCALC 114 (H) 05/18/2019 1015    CBC    Component Value Date/Time   WBC 6.6 03/25/2017 1127   WBC 9.4 03/03/2017 1333   RBC 4.25 03/25/2017 1127   RBC 4.41 03/03/2017 1333   HGB 11.4 03/25/2017 1127   HCT 35.0 03/25/2017 1127   PLT 297 03/25/2017 1127   MCV 82 03/25/2017 1127   MCH 26.8 03/25/2017 1127   MCH 26.8 03/03/2017 1333   MCHC 32.6 03/25/2017 1127   MCHC 32.3 03/03/2017 1333   RDW 15.6 (H) 03/25/2017 1127   LYMPHSABS 2.3 03/25/2017 1127   MONOABS 0.8 03/03/2017 1333   EOSABS 0.2 03/25/2017 1127   BASOSABS 0.0 03/25/2017 1127    Lab Results  Component Value Date   HGBA1C 5.9 08/21/2019    Assessment & Plan:   1. Annual physical exam Counseled on 150 minutes of exercise per week, healthy eating (including decreased daily intake of saturated fats, cholesterol, added sugars, sodium), STI prevention, routine healthcare maintenance. - Flu Vaccine QUAD 36+ mos IM  2. Screening for diabetes mellitus A1c of 5.9-prediabetes - POCT glycosylated hemoglobin (Hb A1C)  3. Screening for breast cancer - MM Digital Screening; Future  4. Screening for cervical cancer - Cytology - PAP([place])  5. Abnormal uterine bleeding Advised to monitor.   Some given the option of additional OCP use to regularize.  Which she is not open to at this time If bleeding continues advised to reach out to her GYN.  6. Nonintractable episodic headache, unspecified headache type - butalbital-acetaminophen-caffeine (FIORICET) 50-325-40 MG tablet; Take 1 tablet by mouth every 12 (twelve) hours as needed for headache.  Dispense: 30 tablet; Refill: 1    Meds ordered  this encounter  Medications  . butalbital-acetaminophen-caffeine (FIORICET) 50-325-40 MG tablet    Sig: Take 1 tablet by mouth every 12 (twelve) hours as needed for headache.    Dispense:  30 tablet    Refill:  1    Follow-up: Return in about 3 months (around 11/20/2019) for medical conditions - virtual.       Charlott Rakes, MD, FAAFP. [facility] and [place], [place]   08/21/2019, 11:30 AM

## 2019-08-23 LAB — CYTOLOGY - PAP
Diagnosis: NEGATIVE
Diagnosis: REACTIVE
High risk HPV: NEGATIVE
Molecular Disclaimer: 56
Molecular Disclaimer: DETECTED
Molecular Disclaimer: NORMAL

## 2019-09-07 ENCOUNTER — Telehealth: Payer: Self-pay

## 2019-09-07 NOTE — Telephone Encounter (Signed)
Patient was called and a voicemail was left informing patient to return phone call for lab results. 

## 2019-09-07 NOTE — Telephone Encounter (Signed)
-----   Message from Charlott Rakes, MD sent at 08/24/2019 10:51 AM EDT ----- Pap smear is negative for malignancy

## 2019-11-10 ENCOUNTER — Other Ambulatory Visit: Payer: Self-pay | Admitting: Family Medicine

## 2019-11-10 DIAGNOSIS — M542 Cervicalgia: Secondary | ICD-10-CM

## 2019-11-20 ENCOUNTER — Ambulatory Visit: Payer: BLUE CROSS/BLUE SHIELD | Attending: Family Medicine | Admitting: Family Medicine

## 2019-11-20 ENCOUNTER — Other Ambulatory Visit: Payer: Self-pay

## 2019-11-20 DIAGNOSIS — I10 Essential (primary) hypertension: Secondary | ICD-10-CM | POA: Diagnosis not present

## 2019-11-20 DIAGNOSIS — M542 Cervicalgia: Secondary | ICD-10-CM

## 2019-11-20 DIAGNOSIS — R519 Headache, unspecified: Secondary | ICD-10-CM

## 2019-11-20 MED ORDER — BUTALBITAL-APAP-CAFFEINE 50-325-40 MG PO TABS: 1.0000 | ORAL_TABLET | Freq: Two times a day (BID) | ORAL | 1 refills | Status: DC | PRN

## 2019-11-20 MED ORDER — AMLODIPINE BESYLATE 10 MG PO TABS: 10.0000 mg | ORAL_TABLET | Freq: Every day | ORAL | 6 refills | Status: DC

## 2019-11-20 MED ORDER — CYCLOBENZAPRINE HCL 10 MG PO TABS: 10.0000 mg | ORAL_TABLET | Freq: Every day | ORAL | 1 refills | Status: DC

## 2019-11-20 NOTE — Progress Notes (Signed)
Patient has been called and DOB has been verified. Patient has been screened and transferred to PCP to start phone visit.     

## 2019-11-20 NOTE — Progress Notes (Signed)
Virtual Visit via Telephone Note  I connected with Carolyn Ramsey, on 11/20/2019 at 9:38 AM by telephone due to the COVID-19 pandemic and verified that I am speaking with the correct person using two identifiers.   Consent: I discussed the limitations, risks, security and privacy concerns of performing an evaluation and management service by telephone and the availability of in person appointments. I also discussed with the patient that there may be a patient responsible charge related to this service. The patient expressed understanding and agreed to proceed.   Location of Patient: Home  Location of Provider: Clinic   Persons participating in Telemedicine visit: [place] Miriam Liles Farrington-CMA Interpreter - [place] ID #[id number] Dr. Alvis Lemmings     History of Present Illness: Carolyn Ramsey is a 43 year old female with a history of hypertension, and chronic headaches who presents for follow-up visit.   She reports doing well and has no concerns today. Her headaches are controlled on Fioricet which she needs about 2x/week. Compliant with her antihypertensive and low sodium diet. She needs Flexeril for intermittent neck pain about 2-3x/week.   Past Medical History:  Diagnosis Date  . Hypertension   . Spinal headache    No Known Allergies  Current Outpatient Medications on File Prior to Visit  Medication Sig Dispense Refill  . amLODipine (NORVASC) 10 MG tablet Take 1 tablet (10 mg total) by mouth daily. 30 tablet 6  . butalbital-acetaminophen-caffeine (FIORICET) 50-325-40 MG tablet Take 1 tablet by mouth every 12 (twelve) hours as needed for headache. 30 tablet 1  . cyclobenzaprine (FLEXERIL) 10 MG tablet TAKE 1 TABLET (10 MG TOTAL) BY MOUTH AT BEDTIME. 30 tablet 0  . levonorgestrel (LILETTA, 52 MG,) 18.6 MCG/DAY IUD IUD 1 Intra Uterine Device (1 each total) by Intrauterine route once. 1 each 0  . naproxen (NAPROSYN) 375 MG tablet Take 1 tablet (375 mg total) by mouth 2  (two) times daily. (Patient not taking: Reported on 02/15/2019) 20 tablet 0   No current facility-administered medications on file prior to visit.    Observations/Objective: Alert, awake, oriented x3 Not in acute distress   CMP Latest Ref Rng & Units 05/18/2019 02/15/2019 03/30/2018  Glucose 65 - 99 mg/dL 90 86 89  BUN 6 - 24 mg/dL 9 9 8   Creatinine 0.57 - 1.00 mg/dL ) 9.62(E) 3.66(Q)  Sodium 134 - 144 mmol/L 140 141 141  Potassium 3.5 - 5.2 mmol/L 3.8 3.9 3.9  Chloride 96 - 106 mmol/L 104 105 106  CO2 20 - 29 mmol/L 21 18(L) 22  Calcium 8.7 - 10.2 mg/dL 8.8 9.4 8.9  Total Protein 6.0 - 8.5 g/dL 7.1 - 6.9  Total Bilirubin 0.0 - 1.2 mg/dL 9.47(M - 0.3  Alkaline Phos 39 - 117 IU/L 66 - 79  AST 0 - 40 IU/L 16 - 10  ALT 0 - 32 IU/L 14 - 11     Lipid Panel     Component Value Date/Time   CHOL 201 (H) 05/18/2019 1015   TRIG 62 05/18/2019 1015   HDL 75 05/18/2019 1015   CHOLHDL 2.7 05/18/2019 1015   CHOLHDL 3.1 03/05/2016 1018   VLDL 9 03/05/2016 1018   LDLCALC 114 (H) 05/18/2019 1015   LABVLDL 12 05/18/2019 1015    Assessment and Plan: 1. Essential hypertension Controlled Counseled on blood pressure goal of less than 130/80, low-sodium, DASH diet, medication compliance, 150 minutes of moderate intensity exercise per week. Discussed medication compliance, adverse effects. - amLODipine (NORVASC) 10 MG tablet; Take 1 tablet (10  mg total) by mouth daily.  Dispense: 30 tablet; Refill: 6  2. Nonintractable episodic headache, unspecified headache type Controlled - butalbital-acetaminophen-caffeine (FIORICET) 50-325-40 MG tablet; Take 1 tablet by mouth every 12 (twelve) hours as needed for headache.  Dispense: 30 tablet; Refill: 1  3. Neck pain Stable - cyclobenzaprine (FLEXERIL) 10 MG tablet; Take 1 tablet (10 mg total) by mouth at bedtime.  Dispense: 30 tablet; Refill: 1   Follow Up Instructions: Return in about 6 months (around 05/20/2020).    I discussed the assessment  and treatment plan with the patient. The patient was provided an opportunity to ask questions and all were answered. The patient agreed with the plan and demonstrated an understanding of the instructions.   The patient was advised to call back or seek an in-person evaluation if the symptoms worsen or if the condition fails to improve as anticipated.     I provided 15 minutes total of non-face-to-face time during this encounter including median intraservice time, reviewing previous notes, labs, imaging, medications, management and patient verbalized understanding.     Charlott Rakes, MD, FAAFP. [facility] and [place], [place]   11/20/2019, 9:38 AM

## 2020-03-02 ENCOUNTER — Other Ambulatory Visit: Payer: Self-pay

## 2020-03-02 ENCOUNTER — Ambulatory Visit (HOSPITAL_COMMUNITY)
Admission: EM | Admit: 2020-03-02 | Discharge: 2020-03-02 | Disposition: A | Discharge status: Home / Self Care | Payer: BLUE CROSS/BLUE SHIELD | Attending: Family Medicine | Admitting: Family Medicine

## 2020-03-02 ENCOUNTER — Encounter (HOSPITAL_COMMUNITY): Payer: Self-pay

## 2020-03-02 ENCOUNTER — Telehealth (HOSPITAL_COMMUNITY): Payer: Self-pay

## 2020-03-02 DIAGNOSIS — I10 Essential (primary) hypertension: Secondary | ICD-10-CM | POA: Insufficient documentation

## 2020-03-02 DIAGNOSIS — M778 Other enthesopathies, not elsewhere classified: Secondary | ICD-10-CM | POA: Insufficient documentation

## 2020-03-02 DIAGNOSIS — R0683 Snoring: Secondary | ICD-10-CM | POA: Insufficient documentation

## 2020-03-02 LAB — CBC WITH DIFFERENTIAL/PLATELET
Abs Immature Granulocytes: 0.03 10*3/uL (ref 0.00–0.07)
Basophils Absolute: 0.1 10*3/uL (ref 0.0–0.1)
Basophils Relative: 1 %
Eosinophils Absolute: 0.3 10*3/uL (ref 0.0–0.5)
Eosinophils Relative: 4 %
HCT: 38.7 % (ref 36.0–46.0)
Hemoglobin: 12.2 g/dL (ref 12.0–15.0)
Immature Granulocytes: 0 %
Lymphocytes Relative: 36 %
Lymphs Abs: 2.9 10*3/uL (ref 0.7–4.0)
MCH: 26.5 pg (ref 26.0–34.0)
MCHC: 31.5 g/dL (ref 30.0–36.0)
MCV: 83.9 fL (ref 80.0–100.0)
Monocytes Absolute: 0.7 10*3/uL (ref 0.1–1.0)
Monocytes Relative: 8 %
Neutro Abs: 4.1 10*3/uL (ref 1.7–7.7)
Neutrophils Relative %: 51 %
Platelets: 316 10*3/uL (ref 150–400)
RBC: 4.61 MIL/uL (ref 3.87–5.11)
RDW: 14.4 % (ref 11.5–15.5)
WBC: 8 10*3/uL (ref 4.0–10.5)
nRBC: 0 % (ref 0.0–0.2)

## 2020-03-02 LAB — SEDIMENTATION RATE: Sed Rate: 51 mm/hr — ABNORMAL HIGH (ref 0–22)

## 2020-03-02 LAB — COMPREHENSIVE METABOLIC PANEL
ALT: 16 U/L (ref 0–44)
AST: 20 U/L (ref 15–41)
Albumin: 3.8 g/dL (ref 3.5–5.0)
Alkaline Phosphatase: 64 U/L (ref 38–126)
Anion gap: 11 (ref 5–15)
BUN: 10 mg/dL (ref 6–20)
CO2: 24 mmol/L (ref 22–32)
Calcium: 9.3 mg/dL (ref 8.9–10.3)
Chloride: 102 mmol/L (ref 98–111)
Creatinine, Ser: 0.44 mg/dL (ref 0.44–1.00)
GFR calc Af Amer: >60 mL/min (ref >60)
GFR calc non Af Amer: >60 mL/min (ref >60)
Glucose, Bld: 99 mg/dL (ref 70–99)
Potassium: 4 mmol/L (ref 3.5–5.1)
Sodium: 137 mmol/L (ref 135–145)
Total Bilirubin: 0.2 mg/dL — ABNORMAL LOW (ref 0.3–1.2)
Total Protein: 8 g/dL (ref 6.5–8.1)

## 2020-03-02 LAB — TSH: TSH: 1.213 u[IU]/mL (ref 0.350–4.500)

## 2020-03-02 MED ORDER — PREDNISONE 50 MG PO TABS: ORAL_TABLET | ORAL | 0 refills | Status: DC

## 2020-03-02 NOTE — Discharge Instructions (Addendum)
If labs are normal, please make appointment with Dr. Marchelle Gearing  Remember to take your blood pressure medicine daily  Your wrist should be better by Wednesday

## 2020-03-02 NOTE — ED Provider Notes (Signed)
[facility]    CSN: [id number] Arrival date & time: 03/02/20  1441      History   Chief Complaint Chief Complaint  Patient presents with  . Snoring    HPI Carolyn Ramsey is a 44 y.o. female.   [facility] patient visit  Pt is here with snoring that started a year ago, states her husband wants her to be evaluated because he thinks she snores too loud.   She has had no significant congestion in her nose or throat.  She has had no breathing problems or fever.  She has no cough or shortness of breath or wheezing.  Patient also complains of right wrist tenderness in the radial artery area.  She said that she reached to pull on a drawer several days ago and experienced sharp pain which is continued when she moves her wrist.  She has no numbness in her hand.     Past Medical History:  Diagnosis Date  . Hypertension   . Spinal headache     Patient Active Problem List   Diagnosis Date Noted  . Obesity 05/18/2019  . Language barrier 08/06/2016  . Essential hypertension 11/14/2015  . 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 NO PAST SURGERIES      OB History    Gravida  5   Para  5   Term  5   Preterm  0   AB  0   Living  5     SAB  0   TAB  0   Ectopic  0   Multiple  0   Live Births  2            Home Medications    Prior to Admission medications   Medication Sig Start Date End Date Taking? Authorizing Provider  amLODipine (NORVASC) 10 MG tablet Take 1 tablet (10 mg total) by mouth daily. 11/20/19   Hoy Register, MD  levonorgestrel (LILETTA, 52 MG,) 18.6 MCG/DAY IUD IUD 1 Intra Uterine Device (1 each total) by Intrauterine route once. 02/09/17 04/21/23  Leftwich-Kirby, Wilmer Floor, CNM  predniSONE (DELTASONE) 50 MG tablet One daily 03/02/20   Elvina Sidle, MD    Family History Family History  Problem Relation Age of Onset  . Healthy Father   . Healthy Mother     Social  History Social History   Tobacco Use  . Smoking status: Never Smoker  . Smokeless tobacco: Never Used  Substance Use Topics  . Alcohol use: No    Alcohol/week: 0.0 standard drinks  . Drug use: No     Allergies   Patient has no known allergies.   Review of Systems Review of Systems  Musculoskeletal: Positive for arthralgias.  All other systems reviewed and are negative.    Physical Exam Triage Vital Signs ED Triage Vitals  Enc Vitals Group     BP 03/02/20 1515 (!) 158/113     Pulse Rate 03/02/20 1515 (!) 108     Resp 03/02/20 1515 (!) 21     Temp 03/02/20 1515 98.6 F (37 C)     Temp Source 03/02/20 1515 Oral     SpO2 03/02/20 1515 99 %     Weight 03/02/20 1521 237 lb 12.8 oz (107.9 kg)     Height --      Head Circumference --      Peak Flow --      Pain Score 03/02/20 1514 0  Pain Loc --      Pain Edu? --      Excl. in [place]? --    No data found.  Updated Vital Signs BP (!) 158/113 (BP Location: Left Arm)   Pulse (!) 108   Temp 98.6 F (37 C) (Oral)   Resp (!) 21   Wt 107.9 kg   SpO2 99%   BMI 37.24 kg/m    Physical Exam Vitals and nursing note reviewed.  Constitutional:      Appearance: Normal appearance. She is obese.  HENT:     Head: Normocephalic.     Nose: Nose normal.     Mouth/Throat:     Mouth: Mucous membranes are moist.     Pharynx: Oropharynx is clear.  Eyes:     Conjunctiva/sclera: Conjunctivae normal.  Cardiovascular:     Rate and Rhythm: Normal rate.  Pulmonary:     Effort: Pulmonary effort is normal.  Musculoskeletal:     Cervical back: Normal range of motion and neck supple.     Comments: Tender right distal radius wrist with mild swelling  Skin:    General: Skin is warm and dry.  Neurological:     General: No focal deficit present.     Mental Status: She is alert and oriented to person, place, and time.  Psychiatric:        Mood and Affect: Mood normal.        Behavior: Behavior normal.        Thought Content:  Thought content normal.        Judgment: Judgment normal.      UC Treatments / Results  Labs (all labs ordered are listed, but only abnormal results are displayed) Labs Reviewed  CBC WITH DIFFERENTIAL/PLATELET  COMPREHENSIVE METABOLIC PANEL  TSH  SEDIMENTATION RATE    EKG   Radiology No results found.  Procedures Procedures (including critical care time)  Medications Ordered in UC Medications - No data to display  Initial Impression / Assessment and Plan / UC Course  I have reviewed the triage vital signs and the nursing notes.  Pertinent labs & imaging results that were available during my care of the patient were reviewed by me and considered in my medical decision making (see chart for details).    Final Clinical Impressions(s) / UC Diagnoses   Final diagnoses:  Snoring  Tendonitis of wrist, right  Essential hypertension     Discharge Instructions     If labs are normal, please make appointment with Dr. Chase Caller  Remember to take your blood pressure medicine daily  Your wrist should be better by Wednesday    ED Prescriptions    Medication Sig Dispense Auth. Provider   predniSONE (DELTASONE) 50 MG tablet One daily 5 tablet Robyn Haber, MD     I have reviewed the PDMP during this encounter.   Robyn Haber, MD 03/02/20 1544

## 2020-03-02 NOTE — ED Triage Notes (Signed)
Pt is here with snoring that started a year ago, states her husband wants her to be evaluated because he thinks she snores to loud.

## 2020-04-03 ENCOUNTER — Other Ambulatory Visit: Payer: Self-pay

## 2020-04-03 ENCOUNTER — Encounter: Payer: Self-pay | Admitting: Family Medicine

## 2020-04-03 ENCOUNTER — Ambulatory Visit: Payer: 59 | Attending: Family Medicine | Admitting: Family Medicine

## 2020-04-03 VITALS — BP 130/91 | HR 99 | Ht 67.0 in | Wt 231.0 lb

## 2020-04-03 DIAGNOSIS — M542 Cervicalgia: Secondary | ICD-10-CM

## 2020-04-03 DIAGNOSIS — R519 Headache, unspecified: Secondary | ICD-10-CM

## 2020-04-03 DIAGNOSIS — I10 Essential (primary) hypertension: Secondary | ICD-10-CM | POA: Diagnosis not present

## 2020-04-03 MED ORDER — BUTALBITAL-APAP-CAFFEINE 50-325-40 MG PO TABS: 1.0000 | ORAL_TABLET | Freq: Four times a day (QID) | ORAL | 1 refills | Status: DC | PRN

## 2020-04-03 MED ORDER — CYCLOBENZAPRINE HCL 10 MG PO TABS: 10.0000 mg | ORAL_TABLET | Freq: Two times a day (BID) | ORAL | 1 refills | Status: DC | PRN

## 2020-04-03 MED ORDER — AMLODIPINE BESYLATE 10 MG PO TABS: 10.0000 mg | ORAL_TABLET | Freq: Every day | ORAL | 1 refills | Status: DC

## 2020-04-03 NOTE — Progress Notes (Signed)
Patient is leaving for [place] in June and she will be needing medication to last her for 45 days.

## 2020-04-03 NOTE — Patient Instructions (Signed)

## 2020-04-03 NOTE — Progress Notes (Signed)
Subjective:  Patient ID: Carolyn Ramsey, female    DOB: [birth year]  Age: 44 y.o. MRN: [id number]  CC: Hypertension   HPI Carolyn Ramsey is a 44 year old female with a history of hypertension, and chronic headaches who presents for follow-up visit. She is compliant with her antihypertensive and denies adverse effects from it. She has chronic headaches and needs Fioricet about 3 times a month as needed.  For her neck pain she uses Flexeril as needed.  This arises from her flexing her neck when she knits or crochets. She will be traveling to [place] and is requesting enough medications to last her for 45 days. She has no additional concerns today and has no headache or neck pain at this time.  Past Medical History:  Diagnosis Date  . Hypertension   . Spinal headache     Past Surgical History:  Procedure Laterality Date  . NO PAST SURGERIES      Family History  Problem Relation Age of Onset  . Healthy Father   . Healthy Mother     No Known Allergies  Outpatient Medications Prior to Visit  Medication Sig Dispense Refill  . amLODipine (NORVASC) 10 MG tablet Take 1 tablet (10 mg total) by mouth daily. 30 tablet 6  . levonorgestrel (LILETTA, 52 MG,) 18.6 MCG/DAY IUD IUD 1 Intra Uterine Device (1 each total) by Intrauterine route once. 1 each 0  . predniSONE (DELTASONE) 50 MG tablet One daily 5 tablet 0   No facility-administered medications prior to visit.     ROS Review of Systems  Constitutional: Negative for activity change, appetite change and fatigue.  HENT: Negative for congestion, sinus pressure and sore throat.   Eyes: Negative for visual disturbance.  Respiratory: Negative for cough, chest tightness, shortness of breath and wheezing.   Cardiovascular: Negative for chest pain and palpitations.  Gastrointestinal: Negative for abdominal distention, abdominal pain and constipation.  Endocrine: Negative for polydipsia.  Genitourinary: Negative for dysuria and  frequency.  Musculoskeletal: Negative for arthralgias and back pain.  Skin: Negative for rash.  Neurological: Negative for tremors, light-headedness and numbness.  Hematological: Does not bruise/bleed easily.  Psychiatric/Behavioral: Negative for agitation and behavioral problems.    Objective:  BP (!) 130/91   Pulse 99   Ht 5\' 7"  (1.702 m)   Wt 231 lb (104.8 kg)   SpO2 100%   BMI 36.18 kg/m   BP/Weight 04/03/2020 03/02/2020 6/38/7564  Systolic BP 332 951 884  Diastolic BP 91 166 89  Wt. (Lbs) 231 237.8 231.4  BMI 36.18 37.24 36.24      Physical Exam Constitutional:      Appearance: She is well-developed.  Neck:     Vascular: No JVD.  Cardiovascular:     Rate and Rhythm: Normal rate.     Heart sounds: Normal heart sounds. No murmur.  Pulmonary:     Effort: Pulmonary effort is normal.     Breath sounds: Normal breath sounds. No wheezing or rales.  Chest:     Chest wall: No tenderness.  Abdominal:     General: Bowel sounds are normal. There is no distension.     Palpations: Abdomen is soft. There is no mass.     Tenderness: There is no abdominal tenderness.  Musculoskeletal:        General: Normal range of motion.     Right lower leg: No edema.     Left lower leg: No edema.  Neurological:     Mental Status: She is alert  and oriented to person, place, and time.  Psychiatric:        Mood and Affect: Mood normal.     CMP Latest Ref Rng & Units 03/02/2020 05/18/2019 02/15/2019  Glucose 70 - 99 mg/dL 99 90 86  BUN 6 - 20 mg/dL 10 9 9   Creatinine 0.44 - 1.00 mg/dL 1.02) 5.85(I)  Sodium 135 - 145 mmol/L 137 140 141  Potassium 3.5 - 5.1 mmol/L 4.0 3.8 3.9  Chloride 98 - 111 mmol/L 102 104 105  CO2 22 - 32 mmol/L 24 21 18(L)  Calcium 8.9 - 10.3 mg/dL 9.3 8.8 9.4  Total Protein 6.5 - 8.1 g/dL 8.0 7.1 -  Total Bilirubin 0.3 - 1.2 mg/dL 7.78(E) 4.2(P -  Alkaline Phos 38 - 126 U/L 64 66 -  AST 15 - 41 U/L 20 16 -  ALT 0 - 44 U/L 16 14 -    Lipid Panel       Component Value Date/Time   CHOL 201 (H) 05/18/2019 1015   TRIG 62 05/18/2019 1015   HDL 75 05/18/2019 1015   CHOLHDL 2.7 05/18/2019 1015   CHOLHDL 3.1 03/05/2016 1018   VLDL 9 03/05/2016 1018   LDLCALC 114 (H) 05/18/2019 1015    CBC    Component Value Date/Time   WBC 8.0 03/02/2020 1541   RBC 4.61 03/02/2020 1541   HGB 12.2 03/02/2020 1541   HGB 11.4 03/25/2017 1127   HCT 38.7 03/02/2020 1541   HCT 35.0 03/25/2017 1127   PLT 316 03/02/2020 1541   PLT 297 03/25/2017 1127   MCV 83.9 03/02/2020 1541   MCV 82 03/25/2017 1127   MCH 26.5 03/02/2020 1541   MCHC 31.5 03/02/2020 1541   RDW 14.4 03/02/2020 1541   RDW 15.6 (H) 03/25/2017 1127   LYMPHSABS 2.9 03/02/2020 1541   LYMPHSABS 2.3 03/25/2017 1127   MONOABS 0.7 03/02/2020 1541   EOSABS 0.3 03/02/2020 1541   EOSABS 0.2 03/25/2017 1127   BASOSABS 0.1 03/02/2020 1541   BASOSABS 0.0 03/25/2017 1127    Lab Results  Component Value Date   HGBA1C 5.9 08/21/2019    Assessment & Plan:   1. Essential hypertension Slight diastolic elevation No regimen changed today Counseled on blood pressure goal of less than 130/80, low-sodium, DASH diet, medication compliance, 150 minutes of moderate intensity exercise per week. Discussed medication compliance, adverse effects. - amLODipine (NORVASC) 10 MG tablet; Take 1 tablet (10 mg total) by mouth daily.  Dispense: 90 tablet; Refill: 1  2. Nonintractable episodic headache, unspecified headache type Stable - butalbital-acetaminophen-caffeine (FIORICET) 50-325-40 MG tablet; Take 1 tablet by mouth every 6 (six) hours as needed for headache.  Dispense: 60 tablet; Refill: 1  3. Neck pain Positional Advised to apply heat and her family member assist with massaging - cyclobenzaprine (FLEXERIL) 10 MG tablet; Take 1 tablet (10 mg total) by mouth 2 (two) times daily as needed for muscle spasms.  Dispense: 60 tablet; Refill: 1    Return in about 6 months (around 10/04/2020) for medical  conditions.   13/03/2020, MD, FAAFP. [facility] [place], [place]   04/03/2020, 4:44 PM

## 2020-07-29 ENCOUNTER — Ambulatory Visit: Payer: 59 | Attending: Family | Admitting: Family

## 2020-07-29 ENCOUNTER — Telehealth: Payer: Self-pay | Admitting: Family Medicine

## 2020-07-29 DIAGNOSIS — M25562 Pain in left knee: Secondary | ICD-10-CM

## 2020-07-29 DIAGNOSIS — Z789 Other specified health status: Secondary | ICD-10-CM

## 2020-07-29 MED ORDER — NAPROXEN 250 MG PO TABS: 250.0000 mg | ORAL_TABLET | Freq: Two times a day (BID) | ORAL | 0 refills | Status: AC

## 2020-07-29 NOTE — Telephone Encounter (Signed)
Patient requested for listed medications to be refilled and sent to [facility].

## 2020-07-29 NOTE — Progress Notes (Signed)
Virtual Visit via Telephone Note  I connected with Carolyn Ramsey, on 07/29/2020 at 8:41 AM by telephone due to the COVID-19 pandemic and verified that I am speaking with the correct person using two identifiers.  Due to current restrictions/limitations of in-office visits due to the COVID-19 pandemic, this scheduled clinical appointment was converted to a telehealth visit.   Consent: I discussed the limitations, risks, security and privacy concerns of performing an evaluation and management service by telephone and the availability of in person appointments. I also discussed with the patient that there may be a patient responsible charge related to this service. The patient expressed understanding and agreed to proceed.  Location of Patient: Home  Location of Provider: [facility]  Persons participating in Telemedicine visit: Carolyn Moffit Erinne Gillentine Zonia Kief, NP Laruth Bouchard, CMA  History of Present Illness: Carolyn Ramsey is a 44 year old female with history of essential hypertension, female circumcision, and obesity who presents for knee pain.  1. KNEE PAIN: Duration: months Involved knee: left  Mechanism of injury: none Location:anterior Severity: 8/10  Quality:  cannot describe Frequency: intermittent Radiation: no Aggravating factors: walking  Alleviating factors: nothing  Status: better Treatments attempted: none  Relief with NSAIDs?:  No NSAIDs Taken Weakness with weight bearing or walking: no Sensation of giving way: no Locking: no Popping: yes sometimes Bruising: no Swelling: no Redness: yes Paresthesias/decreased sensation: no Reports recently she went to [place] and the bathroom there requires her to bend/sit on her knees on the ground. Reports pain began during that time. Reports pain is worse at night.   Past Medical History:  Diagnosis Date  . Hypertension   . Spinal headache    No Known Allergies  Current Outpatient Medications  on File Prior to Visit  Medication Sig Dispense Refill  . amLODipine (NORVASC) 10 MG tablet Take 1 tablet (10 mg total) by mouth daily. 90 tablet 1  . butalbital-acetaminophen-caffeine (FIORICET) 50-325-40 MG tablet Take 1 tablet by mouth every 6 (six) hours as needed for headache. 60 tablet 1  . cyclobenzaprine (FLEXERIL) 10 MG tablet Take 1 tablet (10 mg total) by mouth 2 (two) times daily as needed for muscle spasms. 60 tablet 1  . levonorgestrel (LILETTA, 52 MG,) 18.6 MCG/DAY IUD IUD 1 Intra Uterine Device (1 each total) by Intrauterine route once. 1 each 0  . predniSONE (DELTASONE) 50 MG tablet One daily 5 tablet 0   No current facility-administered medications on file prior to visit.    Observations/Objective: Alert and oriented x 3. Not in acute distress. Physical examination not completed as this is a telemedicine visit.  Assessment and Plan: 1. Acute pain of left knee: - Patient with left knee pain for at least 1 month. Reports this began once returning from a trip to [place]. Reports the toilets in [place] require her to bend/sit on her knees on the ground to use the toilet and does not have a seat as she is accustomed to. Reports she has not tried any medications or therapies at home for this. - Naproxen for knee pain. Patient reports she is not breastfeeding and not pregnant. - May use knee sleeve or brace to assist with pain management.  - Rest your knee as much as possible. Raise your knee while you are sitting or lying down. Make sure your knee is higher than your heart. - Put ice in a plastic bag and place towel between your skin and the bag to assist with pain management. Leave the ice on  for 20 minutes 2 - 3 times a day as tolerated.  - Follow-up with primary physician in 4 weeks or sooner if needed.  - naproxen (NAPROSYN) 250 MG tablet; Take 1 tablet (250 mg total) by mouth 2 (two) times daily with a meal.  Dispense: 60 tablet; Refill: 0  2. Language barrier: - [place]  Interpreters participated during this call today. - Interpreter Name: Ranae Palms: [id number] - Interpreter Name: Peter Garter ID#: [id number] - Interpreter Name: Hassie Bruce, ID#: [id number]   Follow Up Instructions: Follow-up with primary physician in 4 weeks or sooner if needed.   Patient was given clear instructions to go to Emergency Department or return to medical center if symptoms don't improve, worsen, or new problems develop.The patient verbalized understanding.  I discussed the assessment and treatment plan with the patient. The patient was provided an opportunity to ask questions and all were answered. The patient agreed with the plan and demonstrated an understanding of the instructions.   The patient was advised to call back or seek an in-person evaluation if the symptoms worsen or if the condition fails to improve as anticipated.   I provided 34 minutes total of non-face-to-face time during this encounter including median intraservice time, reviewing previous notes, labs, imaging, medications, management and patient verbalized understanding.    Rema Fendt, NP  [facility] [place], [place] [phone]   07/29/2020, 7:41 AM

## 2020-07-29 NOTE — Patient Instructions (Addendum)
Naproxen for knee pain. Follow-up with primary physician in 4 weeks or sooner if needed.  Acute Knee Pain, Adult Many things can cause knee pain. Sometimes, knee pain is sudden (acute) and may be caused by damage, swelling, or irritation of the muscles and tissues that support your knee. The pain often goes away on its own with time and rest. If the pain does not go away, tests may be done to find out what is causing the pain. Follow these instructions at home: Pay attention to any changes in your symptoms. Take these actions to relieve your pain. If you have a knee sleeve or brace:   Wear the sleeve or brace as told by your doctor. Remove it only as told by your doctor.  Loosen the sleeve or brace if your toes: ? Tingle. ? Become numb. ? Turn cold and blue.  Keep the sleeve or brace clean.  If the sleeve or brace is not waterproof: ? Do not let it get wet. ? Cover it with a watertight covering when you take a bath or shower. Activity  Rest your knee.  Do not do things that cause pain.  Avoid activities where both feet leave the ground at the same time (high-impact activities). Examples are running, jumping rope, and doing jumping jacks.  Work with a physical therapist to make a safe exercise program, as told by your doctor. Managing pain, stiffness, and swelling   If told, put ice on the knee: ? Put ice in a plastic bag. ? Place a towel between your skin and the bag. ? Leave the ice on for 20 minutes, 2-3 times a day.  If told, put pressure (compression) on your injured knee to control swelling, give support, and help with discomfort. Compression may be done with an elastic bandage. General instructions  Take all medicines only as told by your doctor.  Raise (elevate) your knee while you are sitting or lying down. Make sure your knee is higher than your heart.  Sleep with a pillow under your knee.  Do not use any products that contain nicotine or tobacco. These  include cigarettes, e-cigarettes, and chewing tobacco. These products may slow down healing. If you need help quitting, ask your doctor.  If you are overweight, work with your doctor and a food expert (dietitian) to set goals to lose weight. Being overweight can make your knee hurt more.  Keep all follow-up visits as told by your doctor. This is important. Contact a doctor if:  The knee pain does not stop.  The knee pain changes or gets worse.  You have a fever along with knee pain.  Your knee feels warm when you touch it.  Your knee gives out or locks up. Get help right away if:  Your knee swells, and the swelling gets worse.  You cannot move your knee.  You have very bad knee pain. Summary  Many things can cause knee pain. The pain often goes away on its own with time and rest.  Your doctor may do tests to find out the cause of the pain.  Pay attention to any changes in your symptoms. Relieve your pain with rest, medicines, light activity, and use of ice.  Get help right away if you cannot move your knee or your knee pain is very bad. This information is not intended to replace advice given to you by your health care provider. Make sure you discuss any questions you have with your health care provider. Document Revised:  04/28/2018 Document Reviewed: 04/28/2018 Elsevier Patient Education  [employer].

## 2020-10-03 ENCOUNTER — Other Ambulatory Visit: Payer: Self-pay | Admitting: Family Medicine

## 2020-10-03 ENCOUNTER — Ambulatory Visit: Payer: 59 | Attending: Family Medicine | Admitting: Family Medicine

## 2020-10-03 ENCOUNTER — Encounter: Payer: Self-pay | Admitting: Family Medicine

## 2020-10-03 ENCOUNTER — Other Ambulatory Visit: Payer: Self-pay

## 2020-10-03 VITALS — BP 145/93 | HR 97 | Wt 233.9 lb

## 2020-10-03 DIAGNOSIS — Z1231 Encounter for screening mammogram for malignant neoplasm of breast: Secondary | ICD-10-CM

## 2020-10-03 DIAGNOSIS — I10 Essential (primary) hypertension: Secondary | ICD-10-CM

## 2020-10-03 DIAGNOSIS — H539 Unspecified visual disturbance: Secondary | ICD-10-CM

## 2020-10-03 DIAGNOSIS — M542 Cervicalgia: Secondary | ICD-10-CM | POA: Diagnosis not present

## 2020-10-03 DIAGNOSIS — R519 Headache, unspecified: Secondary | ICD-10-CM

## 2020-10-03 DIAGNOSIS — J029 Acute pharyngitis, unspecified: Secondary | ICD-10-CM

## 2020-10-03 MED ORDER — AMOXICILLIN 500 MG PO CAPS: 500.0000 mg | ORAL_CAPSULE | Freq: Two times a day (BID) | ORAL | 0 refills | Status: DC

## 2020-10-03 MED ORDER — CYCLOBENZAPRINE HCL 10 MG PO TABS: 10.0000 mg | ORAL_TABLET | Freq: Two times a day (BID) | ORAL | 2 refills | Status: DC | PRN

## 2020-10-03 MED ORDER — BUTALBITAL-APAP-CAFFEINE 50-325-40 MG PO TABS: 1.0000 | ORAL_TABLET | Freq: Four times a day (QID) | ORAL | 2 refills | Status: DC | PRN

## 2020-10-03 MED ORDER — AMLODIPINE BESYLATE 10 MG PO TABS: 10.0000 mg | ORAL_TABLET | Freq: Every day | ORAL | 1 refills | Status: DC

## 2020-10-03 NOTE — Progress Notes (Signed)
SINCE JUNE BP NOT CHECKED AND HAVE HEADACHE AND SWOLLEN EYES-BLUR VISION  WHEN WAKES UP HAS NECK PAIN AND TONSIL PAIN   NEEDS NEW MAMMOGRAM ORDER

## 2020-10-03 NOTE — Progress Notes (Signed)
Established Patient Office Visit  Subjective:  Patient ID: Carolyn Ramsey, female    DOB: May 11, 1976  Age: 44 y.o. MRN: [id number]  CC:  Chief Complaint  Patient presents with  . Hypertension    BP CHECK     HPI Carolyn Ramsey, 44 year old female who is a patient of Dr. Alvis Lemmings, who presents secondary to complaint of needing follow-up of her blood pressure. She needs refill of her BP medication.   She reports that she has had issues with recurrent headaches especially upon awakening and feels as if her eyelids are swollen and that her eyes are irritated when she first wakes up.  She also notes decreased ability to see close objects.  She also reports that she is having recurrent issues with neck stiffness but this has been chronic.  Area of the headaches was located at the back of the neck/back of the scalp bilaterally.  She needs refill of Fioricet to help with her chronic headaches.  Patient also with complaint of feeling as if she has had some recent sore throat and may have been exposed to someone with strep.   Past Medical History:  Diagnosis Date  . Hypertension   . Spinal headache     Past Surgical History:  Procedure Laterality Date  . NO PAST SURGERIES      Family History  Problem Relation Age of Onset  . Healthy Father   . Healthy Mother     Social History   Socioeconomic History  . Marital status: Married    Spouse name: Not on file  . Number of children: Not on file  . Years of education: Not on file  . Highest education level: Not on file  Occupational History  . Not on file  Tobacco Use  . Smoking status: Never Smoker  . Smokeless tobacco: Never Used  Vaping Use  . Vaping Use: Never used  Substance and Sexual Activity  . Alcohol use: No    Alcohol/week: 0.0 standard drinks  . Drug use: No  . Sexual activity: Yes    Birth control/protection: None  Other Topics Concern  . Not on file  Social History Narrative   Lives with husband and 4 children.   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    Social Determinants of Health   Financial Resource Strain:   . Difficulty of Paying Living Expenses: Not on file  Food Insecurity:   . Worried About Programme researcher, broadcasting/film/video in the Last Year: Not on file  . Ran Out of Food in the Last Year: Not on file  Transportation Needs:   . Lack of Transportation (Medical): Not on file  . Lack of Transportation (Non-Medical): Not on file  Physical Activity:   . Days of Exercise per Week: Not on file  . Minutes of Exercise per Session: Not on file  Stress:   . Feeling of Stress : Not on file  Social Connections:   . Frequency of Communication with Friends and Family: Not on file  . Frequency of Social Gatherings with Friends and Family: Not on file  . Attends Religious Services: Not on file  . Active Member of Clubs or Organizations: Not on file  . Attends Banker Meetings: Not on file  . Marital Status: Not on file  Intimate Partner Violence:   . Fear of Current or Ex-Partner: Not on file  . Emotionally Abused: Not on file  . Physically Abused: Not on file  . Sexually  Abused: Not on file    Outpatient Medications Prior to Visit  Medication Sig Dispense Refill  . levonorgestrel (LILETTA, 52 MG,) 18.6 MCG/DAY IUD IUD 1 Intra Uterine Device (1 each total) by Intrauterine route once. 1 each 0  . naproxen (NAPROSYN) 250 MG tablet Take by mouth 2 (two) times daily with a meal.    . amLODipine (NORVASC) 10 MG tablet Take 1 tablet (10 mg total) by mouth daily. 90 tablet 1  . butalbital-acetaminophen-caffeine (FIORICET) 50-325-40 MG tablet Take 1 tablet by mouth every 6 (six) hours as needed for headache. 60 tablet 1  . predniSONE (DELTASONE) 50 MG tablet One daily (Patient not taking: Reported on 10/03/2020) 5 tablet 0  . cyclobenzaprine (FLEXERIL) 10 MG tablet Take 1 tablet (10 mg total) by mouth 2 (two) times daily as needed for muscle spasms. (Patient not taking: Reported on  10/03/2020) 60 tablet 1   No facility-administered medications prior to visit.    No Known Allergies  ROS Review of Systems  Constitutional: Positive for fatigue. Negative for chills and fever.  HENT: Positive for sore throat. Negative for congestion and trouble swallowing.   Eyes: Positive for visual disturbance. Negative for photophobia.       Swelling of eyelids and sensation of eye irritation upon awakening; decrease in near vision  Respiratory: Negative for cough and shortness of breath.   Cardiovascular: Negative for chest pain and palpitations.  Gastrointestinal: Negative for abdominal pain, constipation, diarrhea and nausea.  Genitourinary: Negative for dysuria and frequency.  Musculoskeletal: Positive for arthralgias (knees) and neck pain.  Skin: Negative for rash and wound.  Neurological: Positive for headaches. Negative for dizziness.  Hematological: Negative for adenopathy. Does not bruise/bleed easily.      Objective:    Physical Exam Vitals and nursing note reviewed.  Constitutional:      Appearance: Normal appearance.  HENT:     Nose: Congestion present. No rhinorrhea.     Mouth/Throat:     Pharynx: Posterior oropharyngeal erythema present.     Comments: Patient with edema/erythema of the posterior pharynx and tonsillar edema with questionable right-sided exudate Eyes:     Extraocular Movements: Extraocular movements intact.     Conjunctiva/sclera: Conjunctivae normal.     Pupils: Pupils are equal, round, and reactive to light.  Neck:     Comments: Cervical paraspinous and upper back/trapezius spasm; discomfort to palp at the posterior occipital ridges Cardiovascular:     Rate and Rhythm: Normal rate and regular rhythm.  Pulmonary:     Effort: Pulmonary effort is normal.     Breath sounds: Normal breath sounds.  Abdominal:     Palpations: Abdomen is soft.     Tenderness: There is no abdominal tenderness. There is no right CVA tenderness, left CVA  tenderness, guarding or rebound.  Musculoskeletal:     Cervical back: Normal range of motion and neck supple.     Right lower leg: No edema.     Left lower leg: No edema.  Lymphadenopathy:     Cervical: Cervical adenopathy (Shotty anterior cervical chain lymphadenopathy) present.  Skin:    General: Skin is warm and dry.  Neurological:     General: No focal deficit present.     Mental Status: She is alert and oriented to person, place, and time.  Psychiatric:        Mood and Affect: Mood normal.        Behavior: Behavior normal.     BP (!) 145/93   Pulse  97   Wt 233 lb 14.5 oz (106.1 kg)   SpO2 99%   BMI 36.64 kg/m  Wt Readings from Last 3 Encounters:  10/03/20 233 lb 14.5 oz (106.1 kg)  04/03/20 231 lb (104.8 kg)  03/02/20 237 lb 12.8 oz (107.9 kg)     Health Maintenance Due  Topic Date Due  . Hepatitis C Screening  Never done  . COVID-19 Vaccine (1) Never done  . INFLUENZA VACCINE  06/30/2020    Lab Results  Component Value Date   TSH 1.213 03/02/2020   Lab Results  Component Value Date   WBC 8.0 03/02/2020   HGB 12.2 03/02/2020   HCT 38.7 03/02/2020   MCV 83.9 03/02/2020   PLT 316 03/02/2020   Lab Results  Component Value Date   NA 137 03/02/2020   K 4.0 03/02/2020   CO2 24 03/02/2020   GLUCOSE 99 03/02/2020   BUN 10 03/02/2020   CREATININE 0.44 03/02/2020   BILITOT 0.2 (L) 03/02/2020   ALKPHOS 64 03/02/2020   AST 20 03/02/2020   ALT 16 03/02/2020   PROT 8.0 03/02/2020   ALBUMIN 3.8 03/02/2020   CALCIUM 9.3 03/02/2020   ANIONGAP 11 03/02/2020   Lab Results  Component Value Date   CHOL 201 (H) 05/18/2019   Lab Results  Component Value Date   HDL 75 05/18/2019   Lab Results  Component Value Date   LDLCALC 114 (H) 05/18/2019   Lab Results  Component Value Date   TRIG 62 05/18/2019   Lab Results  Component Value Date   CHOLHDL 2.7 05/18/2019   Lab Results  Component Value Date   HGBA1C 5.9 08/21/2019      Assessment & Plan:    1. Change in vision Patient with complaint of changes in vision, decreased near vision, as well as sensation of eyelids being swollen in the mornings and eyes feeling red/irritated.  Discussed with patient that her symptoms may also be related to allergic rhinitis/allergic conjunctivitis as well as possible age-related changes in vision.  Patient has had hemoglobin A1c of 5.9 in 2020 and this has not been repeated therefore hemoglobin A1c should also be obtained if she continues to have visual changes.  Her most recent glucose on 03/29/2020 was normal at 99. - Ambulatory referral to Ophthalmology  2. Essential hypertension She is encouraged to remain compliant with daily use of amlodipine 10 mg for control of blood pressure and consider sleep study if she continues to have issues with morning headaches. - amLODipine (NORVASC) 10 MG tablet; Take 1 tablet (10 mg total) by mouth daily.  Dispense: 90 tablet; Refill: 1  3. Nonintractable episodic headache, unspecified headache type Patient with history of recurrent headaches and she is provided with refill of Fioricet which she is taken in the past.  She has also been asked to consider sleep study in case sleep apnea is contributing to her issues with morning headaches. - butalbital-acetaminophen-caffeine (FIORICET) 50-325-40 MG tablet; Take 1 tablet by mouth every 6 (six) hours as needed for headache.  Dispense: 60 tablet; Refill: 2  4. Neck pain Prescription provided for refill of Flexeril to help with neck pain which per patient has been chronic.  She is aware that this medication causes drowsiness. - cyclobenzaprine (FLEXERIL) 10 MG tablet; Take 1 tablet (10 mg total) by mouth 2 (two) times daily as needed for muscle spasms.  Dispense: 60 tablet; Refill: 2  5. Sorethroat Patient with sore throat and may have had recent exposure to  strep.  Prescription provided for amoxicillin 500 mg twice daily x7 days.  She is also encouraged to gargle with warm  salt water or use over-the-counter throat lozenges as needed for throat pain. - amoxicillin (AMOXIL) 500 MG capsule; Take 1 capsule (500 mg total) by mouth 2 (two) times daily for 7 days.  Dispense: 14 capsule; Refill: 0   Patient has appointment 11/13/2020 for screening mammogram  Follow-up: Return in about 3 months (around 01/03/2021) for HTN/chronic issues with Dr. Alvis LemmingsNewlin.    Cain Saupeammie Hester Forget, MD

## 2020-10-22 ENCOUNTER — Encounter: Payer: Self-pay | Admitting: Family Medicine

## 2020-11-13 ENCOUNTER — Other Ambulatory Visit: Payer: Self-pay

## 2020-11-13 ENCOUNTER — Ambulatory Visit: Payer: 59

## 2020-11-13 ENCOUNTER — Ambulatory Visit
Admission: RE | Admit: 2020-11-13 | Discharge: 2020-11-13 | Disposition: A | Discharge status: Home / Self Care | Payer: 59 | Source: Ambulatory Visit | Attending: Family Medicine | Admitting: Family Medicine

## 2020-11-13 DIAGNOSIS — Z1231 Encounter for screening mammogram for malignant neoplasm of breast: Secondary | ICD-10-CM

## 2020-11-19 ENCOUNTER — Other Ambulatory Visit: Payer: Self-pay | Admitting: Family Medicine

## 2020-11-19 DIAGNOSIS — R928 Other abnormal and inconclusive findings on diagnostic imaging of breast: Secondary | ICD-10-CM

## 2020-12-05 ENCOUNTER — Other Ambulatory Visit: Payer: Self-pay | Admitting: Family Medicine

## 2020-12-05 ENCOUNTER — Ambulatory Visit
Admission: RE | Admit: 2020-12-05 | Discharge: 2020-12-05 | Disposition: A | Discharge status: Home / Self Care | Payer: 59 | Source: Ambulatory Visit | Attending: Family Medicine | Admitting: Family Medicine

## 2020-12-05 ENCOUNTER — Other Ambulatory Visit: Payer: Self-pay

## 2020-12-05 DIAGNOSIS — R921 Mammographic calcification found on diagnostic imaging of breast: Secondary | ICD-10-CM

## 2020-12-05 DIAGNOSIS — R928 Other abnormal and inconclusive findings on diagnostic imaging of breast: Secondary | ICD-10-CM

## 2020-12-31 ENCOUNTER — Other Ambulatory Visit: Payer: Self-pay

## 2020-12-31 ENCOUNTER — Ambulatory Visit
Admission: RE | Admit: 2020-12-31 | Discharge: 2020-12-31 | Disposition: A | Discharge status: Home / Self Care | Payer: 59 | Source: Ambulatory Visit | Attending: Family Medicine | Admitting: Family Medicine

## 2020-12-31 ENCOUNTER — Other Ambulatory Visit: Payer: Self-pay | Admitting: Diagnostic Radiology

## 2020-12-31 ENCOUNTER — Other Ambulatory Visit: Payer: Self-pay | Admitting: Family Medicine

## 2020-12-31 DIAGNOSIS — R921 Mammographic calcification found on diagnostic imaging of breast: Secondary | ICD-10-CM

## 2021-01-06 ENCOUNTER — Ambulatory Visit: Payer: 59 | Attending: Family Medicine | Admitting: Family Medicine

## 2021-01-06 ENCOUNTER — Encounter: Payer: Self-pay | Admitting: Family Medicine

## 2021-01-06 ENCOUNTER — Other Ambulatory Visit: Payer: Self-pay

## 2021-01-06 ENCOUNTER — Other Ambulatory Visit: Payer: Self-pay | Admitting: Family Medicine

## 2021-01-06 VITALS — BP 132/86 | HR 92 | Ht 67.0 in | Wt 226.0 lb

## 2021-01-06 DIAGNOSIS — M25561 Pain in right knee: Secondary | ICD-10-CM

## 2021-01-06 DIAGNOSIS — R519 Headache, unspecified: Secondary | ICD-10-CM | POA: Diagnosis not present

## 2021-01-06 DIAGNOSIS — M542 Cervicalgia: Secondary | ICD-10-CM | POA: Diagnosis not present

## 2021-01-06 DIAGNOSIS — I10 Essential (primary) hypertension: Secondary | ICD-10-CM

## 2021-01-06 DIAGNOSIS — Z1159 Encounter for screening for other viral diseases: Secondary | ICD-10-CM

## 2021-01-06 DIAGNOSIS — G8929 Other chronic pain: Secondary | ICD-10-CM

## 2021-01-06 MED ORDER — CYCLOBENZAPRINE HCL 10 MG PO TABS: 10.0000 mg | ORAL_TABLET | Freq: Two times a day (BID) | ORAL | 2 refills | Status: DC | PRN

## 2021-01-06 MED ORDER — DICLOFENAC SODIUM 1 % EX GEL: 4.0000 g | Freq: Four times a day (QID) | CUTANEOUS | Status: DC

## 2021-01-06 MED ORDER — BUTALBITAL-APAP-CAFFEINE 50-325-40 MG PO TABS: 1.0000 | ORAL_TABLET | Freq: Four times a day (QID) | ORAL | 2 refills | Status: DC | PRN

## 2021-01-06 MED ORDER — AMLODIPINE BESYLATE 10 MG PO TABS: 10.0000 mg | ORAL_TABLET | Freq: Every day | ORAL | 1 refills | Status: DC

## 2021-01-06 NOTE — Progress Notes (Signed)
Subjective:  Patient ID: Carolyn Ramsey, female    DOB: 1976-09-27  Age: 45 y.o. MRN: [id number]  CC: Hypertension   HPI Carolyn A Mohamedis a 45 year old female with a history of hypertension, and chronic headaches who presents for follow-up visit. She had an abnormal mammogram and breast ultrasound which revealed indeterminate group of calcifications in the right breast for which she underwent stereotactic guided core biopsy of right breast. Pathology revealed benign breast tissue with microcalcifications, no malignancy identified. Return to annual screening was recommended.  Her R knee hurts on going down stairs but she denies presence of swelling and this has been chronic.  Denies history of preceding trauma and she currently does not take any medication for it.  Her hypertension is controlled and she is requesting refills of antihypertensive today and also Fioricet which she uses intermittently for headaches.  She also uses Flexeril for neck muscle spasms.  Past Medical History:  Diagnosis Date  . Hypertension   . Spinal headache     Past Surgical History:  Procedure Laterality Date  . NO PAST SURGERIES      Family History  Problem Relation Age of Onset  . Healthy Father   . Healthy Mother     No Known Allergies  Outpatient Medications Prior to Visit  Medication Sig Dispense Refill  . levonorgestrel (LILETTA, 52 MG,) 18.6 MCG/DAY IUD IUD 1 Intra Uterine Device (1 each total) by Intrauterine route once. 1 each 0  . naproxen (NAPROSYN) 250 MG tablet Take by mouth 2 (two) times daily with a meal.    . amLODipine (NORVASC) 10 MG tablet Take 1 tablet (10 mg total) by mouth daily. 90 tablet 1  . butalbital-acetaminophen-caffeine (FIORICET) 50-325-40 MG tablet Take 1 tablet by mouth every 6 (six) hours as needed for headache. 60 tablet 2  . cyclobenzaprine (FLEXERIL) 10 MG tablet Take 1 tablet (10 mg total) by mouth 2 (two) times daily as needed for muscle spasms. 60 tablet 2   . predniSONE (DELTASONE) 50 MG tablet One daily (Patient not taking: No sig reported) 5 tablet 0   No facility-administered medications prior to visit.     ROS Review of Systems  Constitutional: Negative for activity change, appetite change and fatigue.  HENT: Negative for congestion, sinus pressure and sore throat.   Eyes: Negative for visual disturbance.  Respiratory: Negative for cough, chest tightness, shortness of breath and wheezing.   Cardiovascular: Negative for chest pain and palpitations.  Gastrointestinal: Negative for abdominal distention, abdominal pain and constipation.  Endocrine: Negative for polydipsia.  Genitourinary: Negative for dysuria and frequency.  Musculoskeletal:       See HPI  Skin: Negative for rash.  Neurological: Negative for tremors, light-headedness and numbness.  Hematological: Does not bruise/bleed easily.  Psychiatric/Behavioral: Negative for agitation and behavioral problems.    Objective:  BP 132/86   Pulse 92   Ht 5\' 7"  (1.702 m)   Wt 226 lb (102.5 kg)   SpO2 100%   BMI 35.40 kg/m   BP/Weight 01/06/2021 10/03/2020 04/03/2020  Systolic BP 132 145 130  Diastolic BP 86 93 91  Wt. (Lbs) 226 233.91 231  BMI 35.4 36.64 36.18      Physical Exam Constitutional:      Appearance: She is well-developed.  Neck:     Vascular: No JVD.  Cardiovascular:     Rate and Rhythm: Normal rate.     Heart sounds: Normal heart sounds. No murmur heard.   Pulmonary:  Effort: Pulmonary effort is normal.     Breath sounds: Normal breath sounds. No wheezing or rales.  Chest:     Chest wall: No tenderness.  Abdominal:     General: Bowel sounds are normal. There is no distension.     Palpations: Abdomen is soft. There is no mass.     Tenderness: There is no abdominal tenderness.  Musculoskeletal:        General: Tenderness (slight TTP in inferomedial aspect of R knee) present. No swelling. Normal range of motion.     Right lower leg: No edema.      Left lower leg: No edema.  Neurological:     Mental Status: She is alert and oriented to person, place, and time.  Psychiatric:        Mood and Affect: Mood normal.     CMP Latest Ref Rng & Units 03/02/2020 05/18/2019 02/15/2019  Glucose 70 - 99 mg/dL 99 90 86  BUN 6 - 20 mg/dL 10 9 9   Creatinine 0.44 - 1.00 mg/dL 7.10) 6.26(R)  Sodium 135 - 145 mmol/L 137 140 141  Potassium 3.5 - 5.1 mmol/L 4.0 3.8 3.9  Chloride 98 - 111 mmol/L 102 104 105  CO2 22 - 32 mmol/L 24 21 18(L)  Calcium 8.9 - 10.3 mg/dL 9.3 8.8 9.4  Total Protein 6.5 - 8.1 g/dL 8.0 7.1 -  Total Bilirubin 0.3 - 1.2 mg/dL 4.85(I) 6.2(V -  Alkaline Phos 38 - 126 U/L 64 66 -  AST 15 - 41 U/L 20 16 -  ALT 0 - 44 U/L 16 14 -    Lipid Panel     Component Value Date/Time   CHOL 201 (H) 05/18/2019 1015   TRIG 62 05/18/2019 1015   HDL 75 05/18/2019 1015   CHOLHDL 2.7 05/18/2019 1015   CHOLHDL 3.1 03/05/2016 1018   VLDL 9 03/05/2016 1018   LDLCALC 114 (H) 05/18/2019 1015    CBC    Component Value Date/Time   WBC 8.0 03/02/2020 1541   RBC 4.61 03/02/2020 1541   HGB 12.2 03/02/2020 1541   HGB 11.4 03/25/2017 1127   HCT 38.7 03/02/2020 1541   HCT 35.0 03/25/2017 1127   PLT 316 03/02/2020 1541   PLT 297 03/25/2017 1127   MCV 83.9 03/02/2020 1541   MCV 82 03/25/2017 1127   MCH 26.5 03/02/2020 1541   MCHC 31.5 03/02/2020 1541   RDW 14.4 03/02/2020 1541   RDW 15.6 (H) 03/25/2017 1127   LYMPHSABS 2.9 03/02/2020 1541   LYMPHSABS 2.3 03/25/2017 1127   MONOABS 0.7 03/02/2020 1541   EOSABS 0.3 03/02/2020 1541   EOSABS 0.2 03/25/2017 1127   BASOSABS 0.1 03/02/2020 1541   BASOSABS 0.0 03/25/2017 1127    Lab Results  Component Value Date   HGBA1C 5.9 08/21/2019    Assessment & Plan:  1. Essential hypertension Controlled Counseled on blood pressure goal of less than 130/80, low-sodium, DASH diet, medication compliance, 150 minutes of moderate intensity exercise per week. Discussed medication compliance, adverse  effects. - Lipid panel; Future - Basic Metabolic Panel; Future - amLODipine (NORVASC) 10 MG tablet; Take 1 tablet (10 mg total) by mouth daily.  Dispense: 90 tablet; Refill: 1  2. Nonintractable episodic headache, unspecified headache type Controlled - butalbital-acetaminophen-caffeine (FIORICET) 50-325-40 MG tablet; Take 1 tablet by mouth every 6 (six) hours as needed for headache.  Dispense: 60 tablet; Refill: 2  3. Screening for viral disease - HCV RNA quant rflx ultra or genotyp([facility]); Future  4. Neck pain Stable - cyclobenzaprine (FLEXERIL) 10 MG tablet; Take 1 tablet (10 mg total) by mouth 2 (two) times daily as needed for muscle spasms.  Dispense: 60 tablet; Refill: 2  5. Chronic pain of right knee Could be early osteoarthritis Advised that weight loss will be beneficial - diclofenac Sodium (VOLTAREN) 1 % GEL; Apply 4 g topically 4 (four) times daily.  Dispense: 100 g; Refill: g   Meds ordered this encounter  Medications  . amLODipine (NORVASC) 10 MG tablet    Sig: Take 1 tablet (10 mg total) by mouth daily.    Dispense:  90 tablet    Refill:  1  . butalbital-acetaminophen-caffeine (FIORICET) 50-325-40 MG tablet    Sig: Take 1 tablet by mouth every 6 (six) hours as needed for headache.    Dispense:  60 tablet    Refill:  2  . DISCONTD: diclofenac Sodium (VOLTAREN) 1 % GEL    Sig: Apply 4 g topically 4 (four) times daily.    Dispense:  100 g    Refill:  g  . cyclobenzaprine (FLEXERIL) 10 MG tablet    Sig: Take 1 tablet (10 mg total) by mouth 2 (two) times daily as needed for muscle spasms.    Dispense:  60 tablet    Refill:  2  . diclofenac Sodium (VOLTAREN) 1 % GEL    Sig: Apply 4 g topically 4 (four) times daily.    Dispense:  100 g    Refill:  g    Follow-up: Return in about 6 months (around 07/06/2021) for Chronic disease management.       Hoy Register, MD, FAAFP. [facility] [place], [place] [phone]    01/06/2021, 4:44 PM

## 2021-01-06 NOTE — Patient Instructions (Signed)
Diclofenac Topical Skin Gel What is this medicine? DICLOFENAC (dye KLOE fen ak) is a non-steroidal anti-inflammatory drug, also known as an NSAID. It is used to treat pain, inflammation, and swelling. This medicine may be used for other purposes; ask your health care provider or pharmacist if you have questions. COMMON BRAND NAME(S): DSG Pak, Omeca, Solaravix, Solaraze, ValcoPrep-100, VennGel One, Voltaren Arthritis, Voltaren Gel What should I tell my health care provider before I take this medicine? They need to know if you have any of these conditions:  bleeding disorders  coronary artery bypass graft (CABG) within the past 2 weeks  if you often drink alcohol  heart attack  heart disease  heart failure  high blood pressure  kidney disease  large area of burned or damaged skin  liver disease  low red blood cell counts  lung or breathing disease (asthma)  receiving steroids like dexamethasone or prednisone  smoke cigarettes  skin conditions or sensitivity  stomach bleeding  stomach or intestine problems  take drugs that treat or prevent blood clots  an unusual or allergic reaction to diclofenac, other medicines, foods, dyes, or preservatives  pregnant or trying to get pregnant  breast-feeding How should I use this medicine? This medicine is for external use only. Do not take by mouth. Wash your hands before and after use. If you are treating your hands, only wash your hands before use. Do not get it in your eyes. If you do, rinse your eyes with plenty of cool tap water. Use it as directed on the prescription label at the same time every day. Do not use it more often than directed. Keep taking it unless your health care provider tells you to stop. Apply a thin film of the drug to the affected area. If you are using Voltaren or Venngel One, they come with INSTRUCTIONS FOR USE. Ask your pharmacist for directions on how to use this drug. Read the information carefully.  Talk to your pharmacist or health care provider if you have questions. A special MedGuide will be given to you by the pharmacist with each prescription and refill. Be sure to read this information carefully each time. Talk to your health care provider about the use of this drug in children. Special care may be needed. Overdosage: If you think you have taken too much of this medicine contact a poison control center or emergency room at once. NOTE: This medicine is only for you. Do not share this medicine with others. What if I miss a dose? If you are using Voltaren or Venngel One: If you miss a dose, skip it. Use your next dose at the normal time. Do not use extra or 2 doses at the same time to make up for the missed dose. If you are using Solaraze: If you miss a dose, use it as soon as you can. If it is almost time for your next dose, use only that dose. Do not use double or extra doses. What may interact with this medicine?  aspirin  NSAIDs, medicines for pain and inflammation, like ibuprofen or naproxen Do not use any other skin products without telling your doctor or health care professional. This list may not describe all possible interactions. Give your health care provider a list of all the medicines, herbs, non-prescription drugs, or dietary supplements you use. Also tell them if you smoke, drink alcohol, or use illegal drugs. Some items may interact with your medicine. What should I watch for while using this medicine?   Visit your health care provider for regular checks on your progress. Tell your health care provider if your symptoms do not start to get better or if they get worse. Do not take other medicines that contain aspirin, ibuprofen, or naproxen with this medicine. Side effects such as stomach upset, nausea, or ulcers may be more likely to occur. Many non-prescription medicines contain aspirin, ibuprofen, or naproxen. Always read labels carefully. This medicine can cause serious  ulcers and bleeding in the stomach. It can happen with no warning. Smoking, drinking alcohol, older age, and poor health can also increase risks. Call your health care provider right away if you have stomach pain or blood in your vomit or stool. This medicine does not prevent a heart attack or stroke. This medicine may increase the chance of a heart attack or stroke. The chance may increase the longer you use this medicine or if you have heart disease. If you take aspirin to prevent a heart attack or stroke, talk to your health care provider about using this medicine. Alcohol may interfere with the effect of this medicine. Avoid alcoholic drinks. This medicine may cause serious skin reactions. They can happen weeks to months after starting the medicine. Contact your health care provider right away if you notice fevers or flu-like symptoms with a rash. The rash may be red or purple and then turn into blisters or peeling of the skin. Or, you might notice a red rash with swelling of the face, lips or lymph nodes in your neck or under your arms. Talk to your health care provider if you are pregnant before taking this medicine. Taking this medicine between weeks 20 and 30 of pregnancy may harm your unborn baby. Your health care provider will monitor you closely if you need to take it. After 30 weeks of pregnancy, do not take this medicine. You may get drowsy or dizzy. Do not drive, use machinery, or do anything that needs mental alertness until you know how this medicine affects you. Do not stand up or sit up quickly, especially if you are an older patient. This reduces the risk of dizzy or fainting spells. Be careful brushing or flossing your teeth or using a toothpick because you may get an infection or bleed more easily. If you have any dental work done, tell your dentist you are receiving this medicine. This medicine may make it more difficult to get pregnant. Talk to your health care provider if you are  concerned about your fertility. What side effects may I notice from receiving this medicine? Side effects that you should report to your doctor or health care provider as soon as possible:  allergic reactions (skin rash, itching or hives; swelling of the face, lips, or tongue)  bleeding (bloody or black, tarry stools; red or dark brown urine; spitting up blood or brown material that looks like coffee grounds; red spots on the skin; unusual bruising or bleeding from the eyes, gums, or nose)  blood clot (chest pain; shortness of breath; pain, swelling, or warmth in the leg)  blurred vision OR changes in vision  fast, irregular heartbeat  heart failure (trouble breathing; fast, irregular heartbeat; sudden weight gain; swelling of the ankles, feet, hands; unusually weak or tired)  heart attack (trouble breathing; pain or tightness in the chest, neck, back or arms; unusually weak or tired)  high potassium levels (chest pain; fast, irregular heartbeat; muscle weakness)  kidney injury (trouble passing urine or change in the amount of urine)  liver   injury (dark yellow or brown urine; general ill feeling or flu-like symptoms; loss of appetite, right upper belly pain; unusually weak or tired, yellowing of the eyes or skin)  low red blood cell counts (trouble breathing; feeling faint; lightheaded, falls; unusually weak or tired)  palpitations  rash, fever, and swollen lymph nodes  redness, blistering, peeling, or loosening of the skin, including inside the mouth  stroke (changes in vision; confusion; trouble speaking or understanding; severe headaches; sudden numbness or weakness of the face, arm or leg; trouble walking; dizziness; loss of balance or coordination)  trouble breathing Side effects that usually do not require medical attention (report to your doctor or health care provider if they continue or are bothersome):  constipation  diarrhea  dizziness  headache  nausea,  vomiting  passing gas This list may not describe all possible side effects. Call your doctor for medical advice about side effects. You may report side effects to FDA at 1-800-FDA-1088. Where should I keep my medicine? Keep out of the reach of children and pets. Store at room temperature between 15 and 30 degrees C (59 and 86 degrees F). Do not freeze. Protect from heat. Get rid of any unused medicine after the expiration date. To get rid of medicines that are no longer needed or have expired:  Take the medicine to a medicine take-back program. Check with your pharmacy or law enforcement to find a location.  If you cannot return the medicine, check the label or package insert to see if the medicine should be thrown out in the garbage or flushed down the toilet. If you are not sure, ask your health care provider. If it is safe to put it in the trash, empty the medicine out of the container. Mix the medicine with cat litter, dirt, coffee grounds, or other unwanted substance. Seal the mixture in a bag or container. Put it in the trash. NOTE: This sheet is a summary. It may not cover all possible information. If you have questions about this medicine, talk to your doctor, pharmacist, or health care provider.  2021 Elsevier/Gold Standard (2020-04-12 16:01:32)  

## 2021-01-07 ENCOUNTER — Ambulatory Visit: Payer: 59 | Attending: Family Medicine

## 2021-01-07 DIAGNOSIS — I10 Essential (primary) hypertension: Secondary | ICD-10-CM

## 2021-01-07 DIAGNOSIS — Z1159 Encounter for screening for other viral diseases: Secondary | ICD-10-CM

## 2021-01-08 LAB — BASIC METABOLIC PANEL
BUN/Creatinine Ratio: 16 (ref 9–23)
BUN: 7 mg/dL (ref 6–24)
CO2: 22 mmol/L (ref 20–29)
Calcium: 9 mg/dL (ref 8.7–10.2)
Chloride: 102 mmol/L (ref 96–106)
Creatinine, Ser: 0.45 mg/dL — ABNORMAL LOW (ref 0.57–1.00)
GFR calc Af Amer: 142 mL/min/{1.73_m2} (ref >59)
GFR calc non Af Amer: 123 mL/min/{1.73_m2} (ref >59)
Glucose: 91 mg/dL (ref 65–99)
Potassium: 3.9 mmol/L (ref 3.5–5.2)
Sodium: 139 mmol/L (ref 134–144)

## 2021-01-08 LAB — LIPID PANEL
Chol/HDL Ratio: 3.2 ratio (ref 0.0–4.4)
Cholesterol, Total: 202 mg/dL — ABNORMAL HIGH (ref 100–199)
HDL: 64 mg/dL (ref >39)
LDL Chol Calc (NIH): 127 mg/dL — ABNORMAL HIGH (ref 0–99)
Triglycerides: 59 mg/dL (ref 0–149)
VLDL Cholesterol Cal: 11 mg/dL (ref 5–40)

## 2021-01-08 LAB — HCV RNA QUANT RFLX ULTRA OR GENOTYP: HCV Quant Baseline: NOT DETECTED IU/mL

## 2021-01-09 ENCOUNTER — Telehealth: Payer: Self-pay

## 2021-01-09 NOTE — Telephone Encounter (Signed)
Patient was called and a voicemail was left informing patient to return phone call for lab results.   A letter will be mailed informing patient of normal results.

## 2021-01-09 NOTE — Telephone Encounter (Signed)
-----   Message from Hoy Register, MD sent at 01/08/2021  6:17 PM EST ----- Please inform the patient that labs are normal. Thank you.

## 2021-03-03 ENCOUNTER — Other Ambulatory Visit (HOSPITAL_COMMUNITY): Payer: Self-pay

## 2021-04-07 ENCOUNTER — Other Ambulatory Visit (HOSPITAL_COMMUNITY): Payer: Self-pay

## 2021-04-21 ENCOUNTER — Other Ambulatory Visit (HOSPITAL_COMMUNITY): Payer: Self-pay

## 2021-04-21 MED FILL — Cyclobenzaprine HCl Tab 10 MG: ORAL | 30 days supply | Qty: 60 | Fill #0 | Status: AC

## 2021-04-29 ENCOUNTER — Other Ambulatory Visit (HOSPITAL_COMMUNITY): Payer: Self-pay

## 2021-04-29 MED FILL — Butalbital-Acetaminophen-Caffeine Tab 50-325-40 MG: ORAL | 15 days supply | Qty: 60 | Fill #0 | Status: AC

## 2021-06-03 ENCOUNTER — Other Ambulatory Visit (HOSPITAL_COMMUNITY): Payer: Self-pay

## 2021-06-03 MED FILL — Amlodipine Besylate Tab 10 MG (Base Equivalent): ORAL | 90 days supply | Qty: 90 | Fill #0 | Status: AC

## 2021-06-25 ENCOUNTER — Other Ambulatory Visit (HOSPITAL_COMMUNITY): Payer: Self-pay

## 2021-06-25 MED FILL — Butalbital-Acetaminophen-Caffeine Tab 50-325-40 MG: ORAL | 15 days supply | Qty: 60 | Fill #0 | Status: AC

## 2021-07-15 ENCOUNTER — Other Ambulatory Visit (HOSPITAL_COMMUNITY): Payer: Self-pay

## 2021-07-15 MED FILL — Cyclobenzaprine HCl Tab 10 MG: ORAL | 30 days supply | Qty: 60 | Fill #1 | Status: CN

## 2021-07-15 MED FILL — Cyclobenzaprine HCl Tab 10 MG: ORAL | 30 days supply | Qty: 60 | Fill #0 | Status: AC

## 2021-08-25 ENCOUNTER — Other Ambulatory Visit: Payer: Self-pay | Admitting: Family Medicine

## 2021-08-25 ENCOUNTER — Other Ambulatory Visit (HOSPITAL_COMMUNITY): Payer: Self-pay

## 2021-08-25 DIAGNOSIS — R519 Headache, unspecified: Secondary | ICD-10-CM

## 2021-08-25 NOTE — Telephone Encounter (Signed)
Pharmacist called to get the refill for butalbital-acetaminophen-caffeine (FIORICET) 50-325-40 MG tablet expedited and stated they think she is need of this medication today / please advise asap

## 2021-08-26 ENCOUNTER — Other Ambulatory Visit (HOSPITAL_COMMUNITY): Payer: Self-pay

## 2021-08-26 MED ORDER — BUTALBITAL-APAP-CAFFEINE 50-325-40 MG PO TABS
ORAL_TABLET | ORAL | 2 refills | Status: AC
  Filled 2021-08-26: qty 60, 15d supply, fill #0

## 2021-08-26 NOTE — Telephone Encounter (Signed)
Requested medications are due for refill today yes  Requested medications are on the active medication list yes  Last refill 06/25/21  Last visit 01/06/21, was to return in Aug 2022, no visit scheduled  Future visit scheduled no  Notes to clinic This med is Not Delegated, please assess.

## 2021-08-27 ENCOUNTER — Other Ambulatory Visit (HOSPITAL_COMMUNITY): Payer: Self-pay

## 2021-08-27 ENCOUNTER — Ambulatory Visit: Payer: 59 | Attending: Family Medicine | Admitting: Family Medicine

## 2021-08-27 ENCOUNTER — Encounter: Payer: Self-pay | Admitting: Family Medicine

## 2021-08-27 ENCOUNTER — Other Ambulatory Visit: Payer: Self-pay

## 2021-08-27 VITALS — BP 138/92 | HR 98 | Ht 67.0 in | Wt 232.6 lb

## 2021-08-27 DIAGNOSIS — H527 Unspecified disorder of refraction: Secondary | ICD-10-CM

## 2021-08-27 DIAGNOSIS — I10 Essential (primary) hypertension: Secondary | ICD-10-CM | POA: Diagnosis not present

## 2021-08-27 DIAGNOSIS — R519 Headache, unspecified: Secondary | ICD-10-CM

## 2021-08-27 DIAGNOSIS — Z23 Encounter for immunization: Secondary | ICD-10-CM

## 2021-08-27 DIAGNOSIS — M542 Cervicalgia: Secondary | ICD-10-CM | POA: Diagnosis not present

## 2021-08-27 MED ORDER — CYCLOBENZAPRINE HCL 10 MG PO TABS
10.0000 mg | ORAL_TABLET | Freq: Two times a day (BID) | ORAL | 2 refills | Status: DC | PRN
  Filled 2021-08-27: qty 60, 30d supply, fill #0

## 2021-08-27 MED ORDER — AMLODIPINE BESYLATE 10 MG PO TABS
10.0000 mg | ORAL_TABLET | Freq: Every day | ORAL | 1 refills | Status: DC
  Filled 2021-08-27: qty 90, 90d supply, fill #0
  Filled 2021-12-30: qty 90, 90d supply, fill #1

## 2021-08-27 NOTE — Progress Notes (Signed)
Subjective:  Patient ID: Carolyn Ramsey, female    DOB: 1975-12-15  Age: 45 y.o. MRN: [id number]  CC: Headache and Hypertension   HPI Carolyn Ramsey is a 45 y.o. year old female with a history of hypertension, and chronic headaches who presents for follow-up visit.  Interval History: BP is elevated and she is yet to take her antihypertensive as she is fasting in anticipation of labs. She has difficulty with her reading and would like referal to an Therapist, art.  Her headaches are frequent and occur 3 x /week and Fioricet has been effective.  She has been out of Fioricet for the last couple of days and headaches have persisted.  Headache occurs on the left side of her head but she denies presence of nausea, vomiting, photophobia, phonophobia.  They are rated at 8/10 and prevent her from performing her ADLs. She also uses a muscle relaxant as she does have neck pain associated with her headaches. Denies additional concerns today.  Past Medical History:  Diagnosis Date   Hypertension    Spinal headache     Past Surgical History:  Procedure Laterality Date   NO PAST SURGERIES      Family History  Problem Relation Age of Onset   Healthy Father    Healthy Mother     No Known Allergies  Outpatient Medications Prior to Visit  Medication Sig Dispense Refill   amLODipine (NORVASC) 10 MG tablet TAKE 1 TABLET (10 MG TOTAL) BY MOUTH DAILY. 90 tablet 1   butalbital-acetaminophen-caffeine (FIORICET) 50-325-40 MG tablet TAKE 1 TABLET BY MOUTH EVERY 6 HOURS AS NEEDED FOR HEADACHE. 60 tablet 2   cyclobenzaprine (FLEXERIL) 10 MG tablet TAKE 1 TABLET (10 MG TOTAL) BY MOUTH 2 (TWO) TIMES DAILY AS NEEDED FOR MUSCLE SPASMS. 60 tablet 2   diclofenac Sodium (VOLTAREN) 1 % GEL Apply 4 g topically 4 (four) times daily. 100 g g   levonorgestrel (LILETTA, 52 MG,) 18.6 MCG/DAY IUD IUD 1 Intra Uterine Device (1 each total) by Intrauterine route once. 1 each 0   naproxen (NAPROSYN) 250 MG tablet Take  by mouth 2 (two) times daily with a meal.     amoxicillin (AMOXIL) 500 MG capsule TAKE 1 CAPSULE (500 MG TOTAL) BY MOUTH 2 (TWO) TIMES DAILY FOR 7 DAYS. (Patient not taking: Reported on 08/27/2021) 14 capsule 0   predniSONE (DELTASONE) 50 MG tablet One daily (Patient not taking: No sig reported) 5 tablet 0   No facility-administered medications prior to visit.     ROS Review of Systems  Constitutional:  Negative for activity change, appetite change and fatigue.  HENT:  Negative for congestion, sinus pressure and sore throat.   Eyes:  Positive for visual disturbance.  Respiratory:  Negative for cough, chest tightness, shortness of breath and wheezing.   Cardiovascular:  Negative for chest pain and palpitations.  Gastrointestinal:  Negative for abdominal distention, abdominal pain and constipation.  Endocrine: Negative for polydipsia.  Genitourinary:  Negative for dysuria and frequency.  Musculoskeletal:  Negative for arthralgias and back pain.  Skin:  Negative for rash.  Neurological:  Positive for headaches. Negative for tremors, light-headedness and numbness.  Hematological:  Does not bruise/bleed easily.  Psychiatric/Behavioral:  Negative for agitation and behavioral problems.    Objective:  BP (!) 138/92   Pulse 98   Ht 5' 7"  (1.702 m)   Wt 232 lb 9.6 oz (105.5 kg)   SpO2 93%   BMI 36.43 kg/m   BP/Weight 08/27/2021 01/06/2021  35/01/2991  Systolic BP 426 834 196  Diastolic BP 92 86 93  Wt. (Lbs) 232.6 226 233.91  BMI 36.43 35.4 36.64      Physical Exam Constitutional:      Appearance: She is well-developed.  Cardiovascular:     Rate and Rhythm: Normal rate.     Heart sounds: Normal heart sounds. No murmur heard. Pulmonary:     Effort: Pulmonary effort is normal.     Breath sounds: Normal breath sounds. No wheezing or rales.  Chest:     Chest wall: No tenderness.  Abdominal:     General: Bowel sounds are normal. There is no distension.     Palpations: Abdomen is  soft. There is no mass.     Tenderness: There is no abdominal tenderness.  Musculoskeletal:        General: Normal range of motion.     Right lower leg: No edema.     Left lower leg: No edema.  Neurological:     Mental Status: She is alert and oriented to person, place, and time.  Psychiatric:        Mood and Affect: Mood normal.    CMP Latest Ref Rng & Units 01/07/2021 03/02/2020 05/18/2019  Glucose 65 - 99 mg/dL 91 99 90  BUN 6 - 24 mg/dL 7 10 9   Creatinine 0.57 - 1.00 mg/dL 0.45(L) 0.44 0.41(L)  Sodium 134 - 144 mmol/L 139 137 140  Potassium 3.5 - 5.2 mmol/L 3.9 4.0 3.8  Chloride 96 - 106 mmol/L 102 102 104  CO2 20 - 29 mmol/L 22 24 21   Calcium 8.7 - 10.2 mg/dL 9.0 9.3 8.8  Total Protein 6.5 - 8.1 g/dL - 8.0 7.1  Total Bilirubin 0.3 - 1.2 mg/dL - 0.2(L) <0.2  Alkaline Phos 38 - 126 U/L - 64 66  AST 15 - 41 U/L - 20 16  ALT 0 - 44 U/L - 16 14    Lipid Panel     Component Value Date/Time   CHOL 202 (H) 01/07/2021 1124   TRIG 59 01/07/2021 1124   HDL 64 01/07/2021 1124   CHOLHDL 3.2 01/07/2021 1124   CHOLHDL 3.1 03/05/2016 1018   VLDL 9 03/05/2016 1018   LDLCALC 127 (H) 01/07/2021 1124    CBC    Component Value Date/Time   WBC 8.0 03/02/2020 1541   RBC 4.61 03/02/2020 1541   HGB 12.2 03/02/2020 1541   HGB 11.4 03/25/2017 1127   HCT 38.7 03/02/2020 1541   HCT 35.0 03/25/2017 1127   PLT 316 03/02/2020 1541   PLT 297 03/25/2017 1127   MCV 83.9 03/02/2020 1541   MCV 82 03/25/2017 1127   MCH 26.5 03/02/2020 1541   MCHC 31.5 03/02/2020 1541   RDW 14.4 03/02/2020 1541   RDW 15.6 (H) 03/25/2017 1127   LYMPHSABS 2.9 03/02/2020 1541   LYMPHSABS 2.3 03/25/2017 1127   MONOABS 0.7 03/02/2020 1541   EOSABS 0.3 03/02/2020 1541   EOSABS 0.2 03/25/2017 1127   BASOSABS 0.1 03/02/2020 1541   BASOSABS 0.0 03/25/2017 1127    Lab Results  Component Value Date   HGBA1C 5.9 08/21/2019    Assessment & Plan:  1. Essential hypertension Slightly above goal due to not taking  antihypertensive today Blood pressures were previously controlled Advised to resume amlodipine Counseled on blood pressure goal of less than 130/80, low-sodium, DASH diet, medication compliance, 150 minutes of moderate intensity exercise per week. Discussed medication compliance, adverse effects. - amLODipine (NORVASC) 10 MG tablet;  Take 1 tablet (10 mg total) by mouth daily.  Dispense: 90 tablet; Refill: 1 - CMP14+EGFR - Lipid panel  2. Neck pain Stable - cyclobenzaprine (FLEXERIL) 10 MG tablet; Take 1 tablet (10 mg total) by mouth 2 (two) times daily as needed for muscle spasms.  Dispense: 60 tablet; Refill: 2  3. Nonintractable episodic headache, unspecified headache type Uncontrolled as she has been off Fioricet Counseled about habit-forming nature of Fioricet and the need to switch to a preventive medication due to frequency of about 3 times per week. Patient declines regimen change. I would rather she be on Topamax or amitriptyline We will revisit this again at her next visit.  4. Refractive errors - Ambulatory referral to Ophthalmology  5. Need for immunization against influenza - Flu Vaccine QUAD 4moIM (Fluarix, Fluzone & Alfiuria Quad PF)    No orders of the defined types were placed in this encounter.   Return in about 6 months (around 02/24/2022) for Medical conditions.       ECharlott Rakes MD, FAAFP. [facility] [place]  08/27/2021, 10:55 AM

## 2021-08-27 NOTE — Patient Instructions (Signed)
?????? ????? ??? ??? General Headache Without Cause ?????? ????? ?? ??? ?? ?????? ??????? ?? ????? ?? ??? ????? ?????? ?????. ??? ????? ?????? ??? ????? ?????? ??????? ???????. ???? ?????? ?? ????? ?????? ??? ?? ????? ??????. ?? ??? ??????? ??????? ?????? ?? ???: ???? ??????. ?????? ??????. ?????? ????????. ?????? ?????? ??????. ??? ????? ??? ????????? ?? ??????: ??? ???????? ????? ??????? ?? ?????. ???? ????? ???? ??????? ?????? ??????? ?? ????. ???? ?? ????? ????? ??? ??????? ??? ????? ?????: ??????? ??? ?????   ????? ????? ??????? ???? ????? ????? ???? ?? ??? ???? ???? ????? ??? ?????? ?? ???? ??????? ?????? ????? ??. ????????? ?? ???? ????? ?????? ??? ??????? ???????. ???? ??? ??????? ???? ??????? ??????? ????? ??? ????? ??? ????? ????? ??????: ??? ??? ????? ?? ??? ???????. ????? ??? ????? ??? ???? ??????. ??? ??? ????? ??? ??????? ???? 20 ?????? ???? ??? 2-3 ????? ?? ?????. ???? ????? ??? ?? ????? ??? ??????? ??????? ??? ????? ??? ???. ????? ??????? ???? ??????? ???? ????? ?? ???? ??????? ?????? ??? ?????? ??????? ?? ????? ???????. ???? ??? ????? ??? ????? ????? ???????. ????? ???? ??????? ??? ?????? ???? ?????? ?? 20-30 ?????. ????? ????? ???? ??????? ??? ???? ??? ????? ??? ?????? ??????. ???? ???? ??? ?????? ??? ??? ????? ???? ?????? ?????? ?? ??????? ?? ???????. ????? ????? ????? ?????? ???????? ?? ??? ??????. ??? ???? ??????? ??????? ????? ?? ???? ????? ?????? ?????? ???? ???? ????? ???????. ????? ?????? ??? ????? ?????? ???????. ?? ???? ????? ????????? ????????: ??? ????? ?????? ????????? ??? ????? ??????: ????? ???? ??? ???? ??????. ??????? ??? ???? ??????. ????? ???????? ??? ???? ?????? ?? ??????. ?? ???????? ???????? ????? ??????? ?????? ???? ??? 12 ????? (355 ???) ?? ?????? ?? ??? ??? 5 ?????? (148 ???) ?? ?????? ?? ??? ??? ????? ???? (44 ???) ?? ????????? ???????? ??????. ??? ?????? ?? ????? ????????? ???? ????? ??? ???????? ?? ??? ?? ???????. ??????? ????  ??? ????????  ???? ?????? ?????? ??? ?? ?? ????? ?????? ??????. **??? ???? ??????? ??? ?????: ??????? ?????????? ???? ????????. ??????? ???? ?????? ?? ?????. ?? ?????? ?? ?????? ??????? ?? ???????. ????? ????? ??????? ?? ?????? ????????? ??????. ??? ??????? ?? ??????. ??? ?????? ?? ??? ??????? ???? ?? ??????. ????? ??? ??????? ?????? ????? ??? ????????? ?? ?????? ??? ??????? ???????? ??????????? ???? ?????. ??? ??????? ???? ??????? ?????? ??? ????? ??? ???????? ??????? ?? ???????. ??? ???? ???? ??????? ??????? ??? ??????? ???? ??????? ??????. ??? ???????? ?? ?????. ??? ??? ???????? ??? ????? 7-9 ????? ?? ????? ?? ???? ??????? ???? ???? ?? ???? ??????? ?????? ??????? ??. ????? ???????? ????? ?????? ???????? ????? ???????? ???? ??????? ??????. ???? ??? ???. ??? ??????? ????? ??????? ?????? ?? ??????? ???????: ??? ??????? ??????? ??? ??????? ???? ???? ?????? ???????. ??? ??? ????? ?? ???? ????? ?? ????? ?????? ???????? ????. ??????? ???????? ?? ?????. ??????? ????. ????? ??? ???????? ??? ????? ?? ??????? ???????: ?????? ??? ?????? ?????. ????? ?????? ??? ?????? ?????? ??????? ?? ??????. ??????? ?????? ??? ??????. ?????? ????? ?? ?????. ?????? ??????. ???? ????? ?? ??????. ?????? ???? ?? ????? ?? ?????. ?????? ???? ?? ??????? ?? ????? ?????? ??? ?????? ?? ???????. ????? ??????? ?? ????? ?????. ?????? ????? ?? ??????? ??????. ??????? ???????. ??????? ????? ?????. ???? ?????? ????? ?? ??? ?? ?????? ??????? ?? ????? ?? ??? ????? ?????? ?????. ???? ?????? ?? ????? ?????? ??? ?? ????? ??????. ??? ??? ???????? ?? ????? ????? ?????. ??? ???????? ???? ?????? ?????? ??? ?? ?? ????? ?????? ??????. ??? ???????? ????? ??????? ?? ?????. ???? ????? ???? ??????? ?????? ??????? ?? ????. ??? ??????? ????? ??????? ?????? ??? ??? ????? ?? ???? ????? ?? ?????? ???????? ?? ??? ??? ?????? ?? ???? ?? ????? ???????. ??? ??? ???????? ?????? ??? ??? ?????? ?????? ?? ????? ?? ???? ?????? ??? ??????? ?? ???? ?????? ?? ??????  ???? ??????. ??? ????? ?? ??? ????????? ?? ???? ?????? ????????? ???? ?????? ???? ??????? ??????. ???? ?? ?????? ??? ????? ???? ?? ???? ?? ???? ??????? ??????Marland Kitchen?  Document Revised: 07/18/2018 Document Reviewed: 07/18/2018 Elsevier Patient Education  2022 [employer].

## 2021-08-28 LAB — CMP14+EGFR
ALT: 12 IU/L (ref 0–32)
AST: 12 IU/L (ref 0–40)
Albumin/Globulin Ratio: 1.6 (ref 1.2–2.2)
Albumin: 4.4 g/dL (ref 3.8–4.8)
Alkaline Phosphatase: 77 IU/L (ref 44–121)
BUN/Creatinine Ratio: 19 (ref 9–23)
BUN: 8 mg/dL (ref 6–24)
Bilirubin Total: 0.2 mg/dL (ref 0.0–1.2)
CO2: 23 mmol/L (ref 20–29)
Calcium: 8.8 mg/dL (ref 8.7–10.2)
Chloride: 103 mmol/L (ref 96–106)
Creatinine, Ser: 0.43 mg/dL — ABNORMAL LOW (ref 0.57–1.00)
Globulin, Total: 2.7 g/dL (ref 1.5–4.5)
Glucose: 87 mg/dL (ref 70–99)
Potassium: 3.9 mmol/L (ref 3.5–5.2)
Sodium: 140 mmol/L (ref 134–144)
Total Protein: 7.1 g/dL (ref 6.0–8.5)
eGFR: 124 mL/min/{1.73_m2} (ref >59)

## 2021-08-28 LAB — LIPID PANEL
Chol/HDL Ratio: 3.1 ratio (ref 0.0–4.4)
Cholesterol, Total: 204 mg/dL — ABNORMAL HIGH (ref 100–199)
HDL: 66 mg/dL (ref >39)
LDL Chol Calc (NIH): 127 mg/dL — ABNORMAL HIGH (ref 0–99)
Triglycerides: 58 mg/dL (ref 0–149)
VLDL Cholesterol Cal: 11 mg/dL (ref 5–40)

## 2021-08-29 ENCOUNTER — Telehealth: Payer: Self-pay

## 2021-08-29 NOTE — Telephone Encounter (Signed)
No VM set up for patient at this time.  CRM created and routed to [facility].

## 2021-08-29 NOTE — Telephone Encounter (Signed)
-----   Message from Hoy Register, MD sent at 08/28/2021 10:09 AM EDT ----- Please inform the patient that labs are stable.

## 2021-12-10 ENCOUNTER — Ambulatory Visit (HOSPITAL_COMMUNITY)
Admission: EM | Admit: 2021-12-10 | Discharge: 2021-12-10 | Disposition: A | Discharge status: Home / Self Care | Payer: 59 | Attending: Family Medicine | Admitting: Family Medicine

## 2021-12-10 ENCOUNTER — Encounter (HOSPITAL_COMMUNITY): Payer: Self-pay

## 2021-12-10 ENCOUNTER — Other Ambulatory Visit: Payer: Self-pay

## 2021-12-10 ENCOUNTER — Other Ambulatory Visit (HOSPITAL_COMMUNITY): Payer: Self-pay

## 2021-12-10 DIAGNOSIS — J0120 Acute ethmoidal sinusitis, unspecified: Secondary | ICD-10-CM

## 2021-12-10 DIAGNOSIS — J209 Acute bronchitis, unspecified: Secondary | ICD-10-CM | POA: Diagnosis not present

## 2021-12-10 DIAGNOSIS — R509 Fever, unspecified: Secondary | ICD-10-CM

## 2021-12-10 LAB — POC INFLUENZA A AND B ANTIGEN (URGENT CARE ONLY)
INFLUENZA A ANTIGEN, POC: NEGATIVE
INFLUENZA B ANTIGEN, POC: NEGATIVE

## 2021-12-10 MED ORDER — AMOXICILLIN-POT CLAVULANATE 875-125 MG PO TABS
1.0000 | ORAL_TABLET | Freq: Two times a day (BID) | ORAL | 0 refills | Status: DC
  Filled 2021-12-10: qty 14, 7d supply, fill #0

## 2021-12-10 MED ORDER — PROMETHAZINE-DM 6.25-15 MG/5ML PO SYRP
5.0000 mL | ORAL_SOLUTION | Freq: Four times a day (QID) | ORAL | 0 refills | Status: AC | PRN
  Filled 2021-12-10: qty 118, 6d supply, fill #0

## 2021-12-10 NOTE — Discharge Instructions (Addendum)
You have been diagnosed with sinusitis and bronchitis today.  Your flu swab was negative.  I have sent out an antibiotic for you at this time to treat your symptoms.  I have also sent out a cough syrup.  Please take all the antibiotic, with food, to avoid an upset stomach.  I recommend tylenol and motrin to help with pain/fever.  Get plenty of rest and plenty of fluids.  Please follow up with your doctor if not improving.

## 2021-12-10 NOTE — ED Triage Notes (Signed)
Per interpreter, pt c/o cough, nasal congestion, chills, headache, and dizziness since 11/30/2021. States took 2 tylenols last night.

## 2021-12-10 NOTE — ED Provider Notes (Signed)
[facility]    CSN: [id number] Arrival date & time: 12/10/21  0857      History   Chief Complaint Chief Complaint  Patient presents with   Cough    HPI Carolyn Ramsey is a 46 y.o. female.   Interpreter used today.  Patient is here for stuffy nose, sinus congestion, cough x 11 days.  Fever, chills yesterday.   No wheezing or sob.  More difficulty breathing out of her nose;  + sinus pain/pressure.  She took tylenol yesterday, no other meds taken.  No known sick contact;   Past Medical History:  Diagnosis Date   Hypertension    Spinal headache     Patient Active Problem List   Diagnosis Date Noted   Obesity 05/18/2019   Language barrier 08/06/2016   Essential hypertension 11/14/2015   Female circumcision 08/17/2012    Past Surgical History:  Procedure Laterality Date   NO PAST SURGERIES      OB History     Gravida  5   Para  5   Term  5   Preterm  0   AB  0   Living  5      SAB  0   IAB  0   Ectopic  0   Multiple  0   Live Births  2            Home Medications    Prior to Admission medications   Medication Sig Start Date End Date Taking? Authorizing Provider  amLODipine (NORVASC) 10 MG tablet Take 1 tablet (10 mg total) by mouth daily. 08/27/21   Hoy Register, MD  butalbital-acetaminophen-caffeine (FIORICET) 50-325-40 MG tablet TAKE 1 TABLET BY MOUTH EVERY 6 HOURS AS NEEDED FOR HEADACHE. 08/26/21 08/26/22  Hoy Register, MD  cyclobenzaprine (FLEXERIL) 10 MG tablet Take 1 tablet (10 mg total) by mouth 2 (two) times daily as needed for muscle spasms. 08/27/21   Hoy Register, MD  diclofenac Sodium (VOLTAREN) 1 % GEL Apply 4 g topically 4 (four) times daily. 01/06/21   Hoy Register, MD  levonorgestrel (LILETTA, 52 MG,) 18.6 MCG/DAY IUD IUD 1 Intra Uterine Device (1 each total) by Intrauterine route once. 02/09/17 04/21/23  Leftwich-Kirby, Wilmer Floor, CNM  naproxen (NAPROSYN) 250 MG tablet Take by mouth 2 (two) times daily  with a meal.    [provider]    Family History Family History  Problem Relation Age of Onset   Healthy Father    Healthy Mother     Social History Social History   Tobacco Use   Smoking status: Never   Smokeless tobacco: Never  Vaping Use   Vaping Use: Never used  Substance Use Topics   Alcohol use: No    Alcohol/week: 0.0 standard drinks   Drug use: No     Allergies   Patient has no known allergies.   Review of Systems Review of Systems  Constitutional:  Positive for chills, fatigue and fever. Negative for activity change.  HENT:  Positive for congestion, ear pain, rhinorrhea, sinus pressure, sinus pain and sore throat.   Respiratory:  Positive for cough. Negative for wheezing.   Gastrointestinal: Negative.     Physical Exam Triage Vital Signs ED Triage Vitals [12/10/21 0958]  Enc Vitals Group     BP (!) 164/95     Pulse Rate (!) 108     Resp 18     Temp 99.3 F (37.4 C)     Temp Source Oral  SpO2 98 %     Weight      Height      Head Circumference      Peak Flow      Pain Score 9     Pain Loc      Pain Edu?      Excl. in GC?    No data found.  Updated Vital Signs BP (!) 164/95 (BP Location: Left Arm)    Pulse (!) 108    Temp 99.3 F (37.4 C) (Oral)    Resp 18    SpO2 98%    Breastfeeding No   Visual Acuity Right Eye Distance:   Left Eye Distance:   Bilateral Distance:    Right Eye Near:   Left Eye Near:    Bilateral Near:     Physical Exam Constitutional:      Appearance: Normal appearance.  HENT:     Head: Normocephalic.     Right Ear: A middle ear effusion is present.     Left Ear: A middle ear effusion is present.     Nose: Congestion and rhinorrhea present.     Right Sinus: Maxillary sinus tenderness and frontal sinus tenderness present.     Left Sinus: Maxillary sinus tenderness and frontal sinus tenderness present.     Mouth/Throat:     Mouth: Mucous membranes are moist.     Pharynx: Posterior oropharyngeal  erythema present. No oropharyngeal exudate.  Cardiovascular:     Rate and Rhythm: Normal rate and regular rhythm.  Pulmonary:     Effort: Pulmonary effort is normal.     Breath sounds: Normal breath sounds. No wheezing or rales.  Musculoskeletal:     Cervical back: Normal range of motion and neck supple. Tenderness present.  Lymphadenopathy:     Cervical: No cervical adenopathy.  Neurological:     Mental Status: She is alert.     UC Treatments / Results  Labs (all labs ordered are listed, but only abnormal results are displayed) Labs Reviewed  POC INFLUENZA A AND B ANTIGEN (URGENT CARE ONLY)    EKG   Radiology No results found.  Procedures Procedures (including critical care time)  Medications Ordered in UC Medications - No data to display  Initial Impression / Assessment and Plan / UC Course  I have reviewed the triage vital signs and the nursing notes.  Pertinent labs & imaging results that were available during my care of the patient were reviewed by me and considered in my medical decision making (see chart for details).  Patient seen for 11 days of uri symptoms, and now with fever and chills.  Flu swab was negative.  I have treated her for bronchitis and sinusitis at this time.  Supportive care as well.  Follow up with PCP if not improving as expected.    Final Clinical Impressions(s) / UC Diagnoses   Final diagnoses:  Acute non-recurrent ethmoidal sinusitis  Acute bronchitis, unspecified organism  Fever, unspecified     Discharge Instructions      You have been diagnosed with sinusitis and bronchitis today.  Your flu swab was negative.  I have sent out an antibiotic for you at this time to treat your symptoms.  I have also sent out a cough syrup.  Please take all the antibiotic, with food, to avoid an upset stomach.  I recommend tylenol and motrin to help with pain/fever.  Get plenty of rest and plenty of fluids.  Please follow up with your doctor if  not  improving.     ED Prescriptions     Medication Sig Dispense Auth. Provider   amoxicillin-clavulanate (AUGMENTIN) 875-125 MG tablet Take 1 tablet by mouth every 12 (twelve) hours. 14 tablet Axle Parfait, MD   promethazine-dextromethorphan (PROMETHAZINE-DM) 6.25-15 MG/5ML syrup Take 5 mLs by mouth 4 (four) times daily as needed for up to 5 days for cough. 118 mL Jannifer FranklinPiontek, Meliah Appleman, MD      PDMP not reviewed this encounter.   Jannifer FranklinPiontek, Thoma Paulsen, MD 12/10/21 1102

## 2021-12-26 ENCOUNTER — Other Ambulatory Visit (HOSPITAL_COMMUNITY): Payer: Self-pay

## 2021-12-26 ENCOUNTER — Ambulatory Visit (HOSPITAL_COMMUNITY)
Admission: EM | Admit: 2021-12-26 | Discharge: 2021-12-26 | Disposition: A | Discharge status: Home / Self Care | Payer: 59 | Attending: Family Medicine | Admitting: Family Medicine

## 2021-12-26 ENCOUNTER — Encounter (HOSPITAL_COMMUNITY): Payer: Self-pay | Admitting: *Deleted

## 2021-12-26 ENCOUNTER — Other Ambulatory Visit: Payer: Self-pay

## 2021-12-26 DIAGNOSIS — R1013 Epigastric pain: Secondary | ICD-10-CM | POA: Diagnosis not present

## 2021-12-26 MED ORDER — OMEPRAZOLE 40 MG PO CPDR: 40.0000 mg | DELAYED_RELEASE_CAPSULE | Freq: Every day | ORAL | 0 refills | Status: AC

## 2021-12-26 NOTE — ED Triage Notes (Signed)
Per interpreter Pt has ABD pain sharpe ,stabbing pain that started Sunday and has been off and on . Pt reports ABD pain ongoing now.

## 2021-12-26 NOTE — Discharge Instructions (Addendum)
You were seen today for abdominal pain.  I think this is an irritation from recent antibiotic use.  I have sent out a medication to your pharmacy to take daily x 14 days.  You may also use over the counter pepto-bismol for your pain.  I would avoid eating spicy foods, avoid caffeine, and avoid any alcohol.  You should avoid taking any anti-inflammatories at this time as well.  If you have worsening pain, nausea, vomiting, or blood in your stool then please to go the ER for evaluation.

## 2021-12-26 NOTE — ED Provider Notes (Signed)
[facility]    CSN: [id number] Arrival date & time: 12/26/21  1312      History   Chief Complaint Chief Complaint  Patient presents with   Abdominal Pain    HPI Carolyn Ramsey is a 46 y.o. female.   Interpreter used today.  She is here for abd pain.  She is having sharp pain at the upper abdomen, epigastric area on Sunday.  It does move around, and can go down to the side.  It is too painful to lay down, sleep.  This is much worse with eating.  Pain is better without eating.   No nausea or vomiting.  No diarrhea.  She is not going to the bathroom b/c she is not eating. No otc meds used.  She was seen several weeks ago for uri symptoms, given augmentin for this.   She did not have pain while on the abx, but the pain came afterward.  She was not eating well while on the abx.      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 Authorizing Provider  amLODipine (NORVASC) 10 MG tablet Take 1 tablet (10 mg total) by mouth daily. 08/27/21   Hoy Register, MD  butalbital-acetaminophen-caffeine (FIORICET) 50-325-40 MG tablet TAKE 1 TABLET BY MOUTH EVERY 6 HOURS AS NEEDED FOR HEADACHE. 08/26/21 08/26/22  Hoy Register, MD  cyclobenzaprine (FLEXERIL) 10 MG tablet Take 1 tablet (10 mg total) by mouth 2 (two) times daily as needed for muscle spasms. 08/27/21   Hoy Register, MD  diclofenac Sodium (VOLTAREN) 1 %  GEL Apply 4 g topically 4 (four) times daily. 01/06/21   Hoy Register, MD  levonorgestrel (LILETTA, 52 MG,) 18.6 MCG/DAY IUD IUD 1 Intra Uterine Device (1 each total) by Intrauterine route once. 02/09/17 04/21/23  Leftwich-Kirby, Wilmer Floor, CNM  naproxen (NAPROSYN) 250 MG tablet Take by mouth 2 (two) times daily with a meal.    [provider]    Family History Family History  Problem Relation Age of Onset   Healthy Father    Healthy Mother     Social History Social History   Tobacco Use   Smoking status: Never   Smokeless tobacco: Never  Vaping Use   Vaping Use: Never used  Substance Use Topics   Alcohol use: No    Alcohol/week: 0.0 standard drinks   Drug use: No     Allergies   Patient has no known allergies.   Review of Systems Review of Systems  Constitutional: Negative.   HENT: Negative.    Respiratory: Negative.    Cardiovascular: Negative.   Gastrointestinal:  Positive for  abdominal pain. Negative for nausea and vomiting.  Genitourinary: Negative.     Physical Exam Triage Vital Signs ED Triage Vitals  Enc Vitals Group     BP 12/26/21 1346 (!) 150/85     Pulse Rate 12/26/21 1346 (!) 101     Resp 12/26/21 1346 20     Temp 12/26/21 1346 97.6 F (36.4 C)     Temp src --      SpO2 12/26/21 1346 96 %     Weight --      Height --      Head Circumference --      Peak Flow --      Pain Score 12/26/21 1343 4     Pain Loc --      Pain Edu? --      Excl. in GC? --    No data found.  Updated Vital Signs BP (!) 150/85    Pulse (!) 101    Temp 97.6 F (36.4 C)    Resp 20    SpO2 96%   Visual Acuity Right Eye Distance:   Left Eye Distance:   Bilateral Distance:    Right Eye Near:   Left Eye Near:    Bilateral Near:     Physical Exam Constitutional:      Appearance: She is well-developed.  HENT:     Head: Normocephalic.  Cardiovascular:     Rate and Rhythm: Normal rate and regular rhythm.  Pulmonary:     Effort: Pulmonary effort is  normal.     Breath sounds: Normal breath sounds.  Abdominal:     General: Bowel sounds are normal.     Palpations: Abdomen is soft.     Tenderness: There is abdominal tenderness in the epigastric area. There is no guarding or rebound.  Neurological:     Mental Status: She is alert.     UC Treatments / Results  Labs (all labs ordered are listed, but only abnormal results are displayed) Labs Reviewed - No data to display  EKG   Radiology No results found.  Procedures Procedures (including critical care time)  Medications Ordered in UC Medications - No data to display  Initial Impression / Assessment and Plan / UC Course  I have reviewed the triage vital signs and the nursing notes.  Pertinent labs & imaging results that were available during my care of the patient were reviewed by me and considered in my medical decision making (see chart for details).  Patient was seen for epigastric pain, abdominal pain today.  Likely due to irritation from recent abx use.  Will start with PPI, and otc medications to help.  Avoid certain foods and medications that could make this worse.  Go to the ER if she has worsening pain, n/v/blood in her stool.  Follow up with her primary care provider if not resolving as expected.    Final Clinical Impressions(s) / UC Diagnoses   Final diagnoses:  Epigastric pain     Discharge Instructions      You were seen today for abdominal pain.  I think this is an irritation from recent antibiotic use.  I have sent out a medication to your pharmacy to take daily x 14 days.  You may also use over the counter pepto-bismol for your pain.  I would avoid eating spicy foods, avoid caffeine, and avoid any alcohol.  You should avoid taking any anti-inflammatories at this time as well.  If you have worsening pain, nausea, vomiting,  or blood in your stool then please to go the ER for evaluation.     ED Prescriptions     Medication Sig Dispense Auth. Provider    omeprazole (PRILOSEC) 40 MG capsule Take 1 capsule (40 mg total) by mouth daily for 14 days. 14 capsule Jannifer FranklinPiontek, Keigo Whalley, MD      PDMP not reviewed this encounter.   Jannifer FranklinPiontek, Natayla Cadenhead, MD 12/26/21 1413

## 2021-12-30 ENCOUNTER — Telehealth: Payer: Self-pay

## 2021-12-30 ENCOUNTER — Ambulatory Visit: Payer: 59 | Admitting: Family Medicine

## 2021-12-30 ENCOUNTER — Other Ambulatory Visit (HOSPITAL_COMMUNITY): Payer: Self-pay

## 2021-12-30 NOTE — Telephone Encounter (Signed)
Patient came into the office for an appointment. Advised the time of the appointment but patient was confused and thought it was at 10:10 am. Carolyn Ramsey was stating that she was told that she needed a 2 month follow up and not 6 months. Patient would like clarification if she needs to come in sooner than March.

## 2021-12-30 NOTE — Telephone Encounter (Signed)
LAST APPOINTMENT DATE:  08/27/21  NEXT APPOINTMENT DATE: 02/25/22  MEDICATION: amLODipine (NORVASC) 10 MG tablet  Is the patient out of medication? yes  PHARMACY: [facility] (Ph: [phone])

## 2021-12-30 NOTE — Telephone Encounter (Signed)
Pt has refills on medications.

## 2021-12-30 NOTE — Telephone Encounter (Signed)
Pt was called and given another appointment.

## 2022-02-02 ENCOUNTER — Other Ambulatory Visit: Payer: Self-pay | Admitting: Family Medicine

## 2022-02-02 ENCOUNTER — Ambulatory Visit: Payer: 59 | Admitting: Family Medicine

## 2022-02-02 DIAGNOSIS — Z1231 Encounter for screening mammogram for malignant neoplasm of breast: Secondary | ICD-10-CM

## 2022-02-18 ENCOUNTER — Other Ambulatory Visit: Payer: Self-pay

## 2022-02-18 ENCOUNTER — Ambulatory Visit: Payer: No Typology Code available for payment source

## 2022-02-25 ENCOUNTER — Other Ambulatory Visit: Payer: Self-pay

## 2022-02-25 ENCOUNTER — Ambulatory Visit: Payer: No Typology Code available for payment source | Attending: Family Medicine | Admitting: Family Medicine

## 2022-02-25 ENCOUNTER — Other Ambulatory Visit (HOSPITAL_COMMUNITY): Payer: Self-pay

## 2022-02-25 ENCOUNTER — Encounter: Payer: Self-pay | Admitting: Family Medicine

## 2022-02-25 VITALS — BP 145/92 | HR 89 | Ht 67.0 in | Wt 236.2 lb

## 2022-02-25 DIAGNOSIS — Z13228 Encounter for screening for other metabolic disorders: Secondary | ICD-10-CM

## 2022-02-25 DIAGNOSIS — Z975 Presence of (intrauterine) contraceptive device: Secondary | ICD-10-CM

## 2022-02-25 DIAGNOSIS — I10 Essential (primary) hypertension: Secondary | ICD-10-CM

## 2022-02-25 DIAGNOSIS — H539 Unspecified visual disturbance: Secondary | ICD-10-CM

## 2022-02-25 MED ORDER — AMLODIPINE BESYLATE 10 MG PO TABS
10.0000 mg | ORAL_TABLET | Freq: Every day | ORAL | 1 refills | Status: DC
Start: 2022-02-25 — End: 2022-09-01
  Filled 2022-02-25: qty 90, 90d supply, fill #0

## 2022-02-25 MED ORDER — LOSARTAN POTASSIUM 25 MG PO TABS
25.0000 mg | ORAL_TABLET | Freq: Every day | ORAL | 1 refills | Status: DC
  Filled 2022-02-25: qty 90, 90d supply, fill #0
  Filled 2022-07-23: qty 90, 90d supply, fill #1

## 2022-02-25 MED ORDER — OLOPATADINE HCL 0.1 % OP SOLN
1.0000 [drp] | Freq: Two times a day (BID) | OPHTHALMIC | 1 refills | Status: AC
  Filled 2022-02-25: qty 5, 25d supply, fill #0

## 2022-02-25 NOTE — Progress Notes (Signed)
? ?Subjective:  ?Patient ID: Carolyn Ramsey, female    DOB: 30-Jan-1976  Age: 46 y.o. MRN: [id number] ? ?CC: Hypertension ? ? ?HPI ?Carolyn Ramsey is a 46 y.o. year old female with a history of hypertension, and chronic headaches who presents for follow-up visit. ?  ? ?Interval History: ?Her eyes have been burning and tearing. She also has difficulty reading and would like to see an Ophthalmologist. ? ?She no longer has headaches and does not need Fioricet any more. Also no longer needs the Flexeril which she previously took for neck pain. Whenever she has a headache she uses Tylenol which has been effective. ?She endorses compliance with her antihypertensive but her blood pressure is slightly elevated. ? ?She currently has an IUD in place which will be 5 years next month and she would like this replaced.  She noticed that her periods have stopped for the last 6 months. ?Past Medical History:  ?Diagnosis Date  ? Hypertension   ? Spinal headache   ? ? ?Past Surgical History:  ?Procedure Laterality Date  ? NO PAST SURGERIES    ? ? ?Family History  ?Problem Relation Age of Onset  ? Healthy Father   ? Healthy Mother   ? ? ?Social History  ? ?Socioeconomic History  ? Marital status: Married  ?  Spouse name: Not on file  ? Number of children: Not on file  ? Years of education: Not on file  ? Highest education level: Not on file  ?Occupational History  ? Not on file  ?Tobacco Use  ? Smoking status: Never  ? Smokeless tobacco: Never  ?Vaping Use  ? Vaping Use: Never used  ?Substance and Sexual Activity  ? Alcohol use: No  ?  Alcohol/week: 0.0 standard drinks  ? Drug use: No  ? Sexual activity: Yes  ?  Birth control/protection: I.U.D.  ?Other Topics Concern  ? Not on file  ?Social History Narrative  ? Lives with husband and 4 children.  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   ? ?Social Determinants of Health  ? ?Financial Resource Strain: Not on file  ?Food Insecurity: Not on file  ?Transportation Needs:  Not on file  ?Physical Activity: Not on file  ?Stress: Not on file  ?Social Connections: Not on file  ? ? ?No Known Allergies ? ?Outpatient Medications Prior to Visit  ?Medication Sig Dispense Refill  ? amLODipine (NORVASC) 10 MG tablet Take 1 tablet (10 mg total) by mouth daily. 90 tablet 1  ? butalbital-acetaminophen-caffeine (FIORICET) 50-325-40 MG tablet TAKE 1 TABLET BY MOUTH EVERY 6 HOURS AS NEEDED FOR HEADACHE. 60 tablet 2  ? cyclobenzaprine (FLEXERIL) 10 MG tablet Take 1 tablet (10 mg total) by mouth 2 (two) times daily as needed for muscle spasms. 60 tablet 2  ? diclofenac Sodium (VOLTAREN) 1 % GEL Apply 4 g topically 4 (four) times daily. 100 g g  ? levonorgestrel (LILETTA, 52 MG,) 18.6 MCG/DAY IUD IUD 1 Intra Uterine Device (1 each total) by Intrauterine route once. 1 each 0  ? naproxen (NAPROSYN) 250 MG tablet Take by mouth 2 (two) times daily with a meal.    ? omeprazole (PRILOSEC) 40 MG capsule Take 1 capsule (40 mg total) by mouth daily for 14 days. 14 capsule 0  ? ?No facility-administered medications prior to visit.  ? ? ? ?ROS ?Review of Systems  ?Constitutional:  Negative for activity change, appetite change and fatigue.  ?HENT:  Negative for congestion, sinus pressure  and sore throat.   ?Eyes:  Negative for visual disturbance.  ?Respiratory:  Negative for cough, chest tightness, shortness of breath and wheezing.   ?Cardiovascular:  Negative for chest pain and palpitations.  ?Gastrointestinal:  Negative for abdominal distention, abdominal pain and constipation.  ?Endocrine: Negative for polydipsia.  ?Genitourinary:  Positive for menstrual problem. Negative for dysuria and frequency.  ?Musculoskeletal:  Negative for arthralgias and back pain.  ?Skin:  Negative for rash.  ?Neurological:  Negative for tremors, light-headedness and numbness.  ?Hematological:  Does not bruise/bleed easily.  ?Psychiatric/Behavioral:  Negative for agitation and behavioral problems.   ? ?Objective:  ?BP (!) 145/92    Pulse 89   Ht _0  (1.702 m)   Wt 236 lb 3.2 oz (107.1 kg)   SpO2 100%   BMI 36.99 kg/m?  ? ? ?  02/25/2022  ?  9:15 AM 12/26/2021  ?  1:46 PM 12/10/2021  ?  9:58 AM  ?BP/Weight  ?Systolic BP 161 096 045  ?Diastolic BP 92 85 95  ?Wt. (Lbs) 236.2    ?BMI 36.99 kg/m2    ? ? ? ? ?Physical Exam ?Constitutional:   ?   Appearance: She is well-developed.  ?Cardiovascular:  ?   Rate and Rhythm: Normal rate.  ?   Heart sounds: Normal heart sounds. No murmur heard. ?Pulmonary:  ?   Effort: Pulmonary effort is normal.  ?   Breath sounds: Normal breath sounds. No wheezing or rales.  ?Chest:  ?   Chest wall: No tenderness.  ?Abdominal:  ?   General: Bowel sounds are normal. There is no distension.  ?   Palpations: Abdomen is soft. There is no mass.  ?   Tenderness: There is no abdominal tenderness.  ?Musculoskeletal:     ?   General: Normal range of motion.  ?   Right lower leg: No edema.  ?   Left lower leg: No edema.  ?Neurological:  ?   Mental Status: She is alert and oriented to person, place, and time.  ?Psychiatric:     ?   Mood and Affect: Mood normal.  ? ? ? ?  Latest Ref Rng & Units 08/27/2021  ? 11:23 AM 01/07/2021  ? 11:24 AM 03/02/2020  ?  3:41 PM  ?CMP  ?Glucose 70 - 99 mg/dL 87   91   99    ?BUN 6 - 24 mg/dL _1 ?Creatinine 0.57 - 1.00 mg/dL 0.43   0.45   0.44    ?Sodium 134 - 144 mmol/L 140   139   137    ?Potassium 3.5 - 5.2 mmol/L 3.9   3.9   4.0    ?Chloride 96 - 106 mmol/L 103   102   102    ?CO2 20 - 29 mmol/L _2 ?Calcium 8.7 - 10.2 mg/dL 8.8   9.0   9.3    ?Total Protein 6.0 - 8.5 g/dL 7.1    8.0    ?Total Bilirubin 0.0 - 1.2 mg/dL 0.2    0.2    ?Alkaline Phos 44 - 121 IU/L 77    64    ?AST 0 - 40 IU/L 12    20    ?ALT 0 - 32 IU/L 12    16    ? ? ?Lipid Panel  ?   ?Component Value Date/Time  ? CHOL 204 (H) 08/27/2021 1123  ?  TRIG 58 08/27/2021 1123  ? HDL 66 08/27/2021 1123  ? CHOLHDL 3.1 08/27/2021 1123  ? CHOLHDL 3.1 03/05/2016 1018  ? VLDL 9 03/05/2016 1018  ? LDLCALC 127 (H)  08/27/2021 1123  ? ? ?CBC ?   ?Component Value Date/Time  ? WBC 8.0 03/02/2020 1541  ? RBC 4.61 03/02/2020 1541  ? HGB 12.2 03/02/2020 1541  ? HGB 11.4 03/25/2017 1127  ? HCT 38.7 03/02/2020 1541  ? HCT 35.0 03/25/2017 1127  ? PLT 316 03/02/2020 1541  ? PLT 297 03/25/2017 1127  ? MCV 83.9 03/02/2020 1541  ? MCV 82 03/25/2017 1127  ? MCH 26.5 03/02/2020 1541  ? MCHC 31.5 03/02/2020 1541  ? RDW 14.4 03/02/2020 1541  ? RDW 15.6 (H) 03/25/2017 1127  ? LYMPHSABS 2.9 03/02/2020 1541  ? LYMPHSABS 2.3 03/25/2017 1127  ? MONOABS 0.7 03/02/2020 1541  ? EOSABS 0.3 03/02/2020 1541  ? EOSABS 0.2 03/25/2017 1127  ? BASOSABS 0.1 03/02/2020 1541  ? BASOSABS 0.0 03/25/2017 1127  ? ? ?Lab Results  ?Component Value Date  ? HGBA1C 5.9 08/21/2019  ? ? ?Assessment & Plan:  ?1. Essential hypertension ?Slightly above goal ?Losartan added to regimen ?We will check labs today and again in 2 weeks to ensure she is not hyperkalemic ?Counseled on blood pressure goal of less than 130/80, low-sodium, DASH diet, medication compliance, 150 minutes of moderate intensity exercise per week. ?Discussed medication compliance, adverse effects. ?- CMP14+EGFR ?- LP+Non-HDL Cholesterol ?- losartan (COZAAR) 25 MG tablet; Take 1 tablet (25 mg total) by mouth daily.  Dispense: 90 tablet; Refill: 1 ?- Basic Metabolic Panel; Future ?- amLODipine (NORVASC) 10 MG tablet; Take 1 tablet (10 mg total) by mouth daily.  Dispense: 90 tablet; Refill: 1 ? ?2. Abnormal vision ?She does have some allergic conjunctivitis going on.  Will place on eyedrops ?Refer to ophthalmology due to difficulty reading ?- olopatadine (PATANOL) 0.1 % ophthalmic solution; Place 1 drop into both eyes 2 (two) times daily.  Dispense: 5 mL; Refill: 1 ?- Ambulatory referral to Ophthalmology ? ?3. Screening for metabolic disorder ?- Hemoglobin A1c ? ?4. IUD (intrauterine device) in place ?- Ambulatory referral to Gynecology ? ? ?Health Care Maintenance: Due for mammogram ?Order has been placed ?No  orders of the defined types were placed in this encounter. ? ? ?Return in about 6 months (around 08/28/2022) for Chronic medical conditions. ? ? ? ? ? ? ?Charlott Rakes, MD, FAAFP. ?[place]

## 2022-02-25 NOTE — Progress Notes (Signed)
Eyes burning. ?

## 2022-02-26 LAB — LP+NON-HDL CHOLESTEROL
Cholesterol, Total: 199 mg/dL (ref 100–199)
HDL: 68 mg/dL (ref >39)
LDL Chol Calc (NIH): 123 mg/dL — ABNORMAL HIGH (ref 0–99)
Total Non-HDL-Chol (LDL+VLDL): 131 mg/dL — ABNORMAL HIGH (ref 0–129)
Triglycerides: 45 mg/dL (ref 0–149)
VLDL Cholesterol Cal: 8 mg/dL (ref 5–40)

## 2022-02-26 LAB — HEMOGLOBIN A1C
Est. average glucose Bld gHb Est-mCnc: 126 mg/dL
Hgb A1c MFr Bld: 6 % — ABNORMAL HIGH (ref 4.8–5.6)

## 2022-02-26 LAB — CMP14+EGFR
ALT: 15 IU/L (ref 0–32)
AST: 16 IU/L (ref 0–40)
Albumin/Globulin Ratio: 1.3 (ref 1.2–2.2)
Albumin: 4.3 g/dL (ref 3.8–4.8)
Alkaline Phosphatase: 70 IU/L (ref 44–121)
BUN/Creatinine Ratio: 15 (ref 9–23)
BUN: 8 mg/dL (ref 6–24)
Bilirubin Total: 0.2 mg/dL (ref 0.0–1.2)
CO2: 22 mmol/L (ref 20–29)
Calcium: 9.3 mg/dL (ref 8.7–10.2)
Chloride: 102 mmol/L (ref 96–106)
Creatinine, Ser: 0.52 mg/dL — ABNORMAL LOW (ref 0.57–1.00)
Globulin, Total: 3.3 g/dL (ref 1.5–4.5)
Glucose: 113 mg/dL — ABNORMAL HIGH (ref 70–99)
Potassium: 3.8 mmol/L (ref 3.5–5.2)
Sodium: 139 mmol/L (ref 134–144)
Total Protein: 7.6 g/dL (ref 6.0–8.5)
eGFR: 117 mL/min/{1.73_m2} (ref >59)

## 2022-05-19 ENCOUNTER — Encounter: Payer: No Typology Code available for payment source | Admitting: Advanced Practice Midwife

## 2022-07-23 ENCOUNTER — Other Ambulatory Visit (HOSPITAL_COMMUNITY): Payer: Self-pay

## 2022-09-01 ENCOUNTER — Encounter: Payer: Self-pay | Admitting: Family Medicine

## 2022-09-01 ENCOUNTER — Ambulatory Visit: Payer: Commercial Managed Care - HMO | Attending: Family Medicine | Admitting: Family Medicine

## 2022-09-01 ENCOUNTER — Other Ambulatory Visit (HOSPITAL_COMMUNITY): Payer: Self-pay

## 2022-09-01 VITALS — BP 170/110 | HR 86 | Temp 98.7°F | Ht 67.0 in | Wt 240.4 lb

## 2022-09-01 DIAGNOSIS — I10 Essential (primary) hypertension: Secondary | ICD-10-CM

## 2022-09-01 DIAGNOSIS — R7303 Prediabetes: Secondary | ICD-10-CM

## 2022-09-01 DIAGNOSIS — Z23 Encounter for immunization: Secondary | ICD-10-CM

## 2022-09-01 DIAGNOSIS — H539 Unspecified visual disturbance: Secondary | ICD-10-CM

## 2022-09-01 DIAGNOSIS — K0889 Other specified disorders of teeth and supporting structures: Secondary | ICD-10-CM

## 2022-09-01 LAB — POCT GLYCOSYLATED HEMOGLOBIN (HGB A1C): Hemoglobin A1C: 5.9 % — AB (ref 4.0–5.6)

## 2022-09-01 MED ORDER — LOSARTAN POTASSIUM 25 MG PO TABS
25.0000 mg | ORAL_TABLET | Freq: Every day | ORAL | 1 refills | Status: DC
  Filled 2022-09-01: qty 90, 90d supply, fill #0

## 2022-09-01 MED ORDER — AMOXICILLIN 500 MG PO CAPS
500.0000 mg | ORAL_CAPSULE | Freq: Three times a day (TID) | ORAL | 0 refills | Status: DC
  Filled 2022-09-01: qty 30, 10d supply, fill #0

## 2022-09-01 MED ORDER — AMLODIPINE BESYLATE 10 MG PO TABS
10.0000 mg | ORAL_TABLET | Freq: Every day | ORAL | 1 refills | Status: DC
  Filled 2022-09-01: qty 90, 90d supply, fill #0
  Filled 2023-01-22: qty 90, 90d supply, fill #1

## 2022-09-01 NOTE — Patient Instructions (Signed)

## 2022-09-01 NOTE — Progress Notes (Signed)
Subjective:  Patient ID: Carolyn Ramsey, female    DOB: [birth year]  Age: 46 y.o. MRN: [id number]  CC: Hypertension   HPI Carolyn Ramsey is a 46 y.o. year old female with a history of hypertension, and chronic headaches who presents for follow-up visit.  Interval History:  Her BP is elevated and she endorses taking Losartan but apparently had not been taking Amlodipine as she states the Pharmacy never dispensed it. She does not exercise regularly and is not adherent with a low-sodium diet with inadequate intake of fruits and vegetables.  Complains of pain in her tooth in the lower jaw which is also loose. She also request referral to ophthalmology for blurry vision.  I had referred her at her last visit but notes in the chart indicates resources for uninsured patients were mailed to her but the patient informs me she does have medical coverage.  Past Medical History:  Diagnosis Date   Hypertension    Spinal headache     Past Surgical History:  Procedure Laterality Date   NO PAST SURGERIES      Family History  Problem Relation Age of Onset   Healthy Father    Healthy Mother     Social History   Socioeconomic History   Marital status: Married    Spouse name: Not on file   Number of children: Not on file   Years of education: Not on file   Highest education level: Not on file  Occupational History   Not on file  Tobacco Use   Smoking status: Never   Smokeless tobacco: Never  Vaping Use   Vaping Use: Never used  Substance and Sexual Activity   Alcohol use: No    Alcohol/week: 0.0 standard drinks of alcohol   Drug use: No   Sexual activity: Yes    Birth control/protection: I.U.D.  Other Topics Concern   Not on file  Social History Narrative   Lives with husband and 4 children.  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    Social Determinants of Health   Financial Resource Strain: Not on file  Food Insecurity: Not on file  Transportation  Needs: Not on file  Physical Activity: Not on file  Stress: Not on file  Social Connections: Not on file    No Known Allergies  Outpatient Medications Prior to Visit  Medication Sig Dispense Refill   cyclobenzaprine (FLEXERIL) 10 MG tablet Take 1 tablet (10 mg total) by mouth 2 (two) times daily as needed for muscle spasms. 60 tablet 2   diclofenac Sodium (VOLTAREN) 1 % GEL Apply 4 g topically 4 (four) times daily. 100 g g   levonorgestrel (LILETTA, 52 MG,) 18.6 MCG/DAY IUD IUD 1 Intra Uterine Device (1 each total) by Intrauterine route once. 1 each 0   naproxen (NAPROSYN) 250 MG tablet Take by mouth 2 (two) times daily with a meal.     olopatadine (PATANOL) 0.1 % ophthalmic solution Place 1 drop into both eyes 2 (two) times daily. 5 mL 1   amLODipine (NORVASC) 10 MG tablet Take 1 tablet (10 mg total) by mouth daily. 90 tablet 1   losartan (COZAAR) 25 MG tablet Take 1 tablet (25 mg total) by mouth daily. 90 tablet 1   omeprazole (PRILOSEC) 40 MG capsule Take 1 capsule (40 mg total) by mouth daily for 14 days. 14 capsule 0   No facility-administered medications prior to visit.     ROS Review of Systems  Constitutional:  Negative for activity change and appetite change.  HENT:  Positive for dental problem. Negative for sinus pressure and sore throat.   Respiratory:  Negative for chest tightness, shortness of breath and wheezing.   Cardiovascular:  Negative for chest pain and palpitations.  Gastrointestinal:  Negative for abdominal distention, abdominal pain and constipation.  Genitourinary: Negative.   Musculoskeletal: Negative.   Psychiatric/Behavioral:  Negative for behavioral problems and dysphoric mood.     Objective:  BP (!) 170/110   Pulse 86   Temp 98.7 F (37.1 C) (Oral)   Ht 5\' 7"  (1.702 m)   Wt 240 lb 6.4 oz (109 kg)   SpO2 99%   BMI 37.65 kg/m      09/01/2022   11:01 AM 02/25/2022    9:15 AM 12/26/2021    1:46 PM  BP/Weight  Systolic BP 170 145 150   Diastolic BP 110 92 85  Wt. (Lbs) 240.4 236.2   BMI 37.65 kg/m2 36.99 kg/m2       Physical Exam Constitutional:      Appearance: She is well-developed.  HENT:     Mouth/Throat:     Comments: Absent lower incisor tooth, loose lower incisor tooth on right lower jaw Cardiovascular:     Rate and Rhythm: Normal rate.     Heart sounds: Normal heart sounds. No murmur heard. Pulmonary:     Effort: Pulmonary effort is normal.     Breath sounds: Normal breath sounds. No wheezing or rales.  Chest:     Chest wall: No tenderness.  Abdominal:     General: Bowel sounds are normal. There is no distension.     Palpations: Abdomen is soft. There is no mass.     Tenderness: There is no abdominal tenderness.  Musculoskeletal:        General: Normal range of motion.     Right lower leg: No edema.     Left lower leg: No edema.  Neurological:     Mental Status: She is alert and oriented to person, place, and time.  Psychiatric:        Mood and Affect: Mood normal.        Latest Ref Rng & Units 02/25/2022    9:51 AM 08/27/2021   11:23 AM 01/07/2021   11:24 AM  CMP  Glucose 70 - 99 mg/dL 03/07/2021  87  91   BUN 6 - 24 mg/dL 8  8  7    Creatinine 0.57 - 1.00 mg/dL 962   9.52   Sodium 134 - 144 mmol/L 139  140  139   Potassium 3.5 - 5.2 mmol/L 3.8  3.9  3.9   Chloride 96 - 106 mmol/L 102  103  102   CO2 20 - 29 mmol/L 22  23  22    Calcium 8.7 - 10.2 mg/dL 9.3  8.8  9.0   Total Protein 6.0 - 8.5 g/dL 7.6  7.1    Total Bilirubin 0.0 - 1.2 mg/dL 0.2  0.2    Alkaline Phos 44 - 121 IU/L 70  77    AST 0 - 40 IU/L 16  12    ALT 0 - 32 IU/L 15  12      Lipid Panel     Component Value Date/Time   CHOL 199 02/25/2022 0951   TRIG 45 02/25/2022 0951   HDL 68 02/25/2022 0951   CHOLHDL 3.1 08/27/2021 1123   CHOLHDL 3.1 03/05/2016 1018   VLDL 9 03/05/2016 1018  LDLCALC 123 (H) 02/25/2022 0951    CBC    Component Value Date/Time   WBC 8.0 03/02/2020 1541   RBC 4.61 03/02/2020 1541   HGB  12.2 03/02/2020 1541   HGB 11.4 03/25/2017 1127   HCT 38.7 03/02/2020 1541   HCT 35.0 03/25/2017 1127   PLT 316 03/02/2020 1541   PLT 297 03/25/2017 1127   MCV 83.9 03/02/2020 1541   MCV 82 03/25/2017 1127   MCH 26.5 03/02/2020 1541   MCHC 31.5 03/02/2020 1541   RDW 14.4 03/02/2020 1541   RDW 15.6 (H) 03/25/2017 1127   LYMPHSABS 2.9 03/02/2020 1541   LYMPHSABS 2.3 03/25/2017 1127   MONOABS 0.7 03/02/2020 1541   EOSABS 0.3 03/02/2020 1541   EOSABS 0.2 03/25/2017 1127   BASOSABS 0.1 03/02/2020 1541   BASOSABS 0.0 03/25/2017 1127    Lab Results  Component Value Date   HGBA1C 5.9 (A) 09/01/2022    Assessment & Plan:  1.  Prediabetes Labs reveal prediabetes with an A1c of 5.9.  Working on a low carbohydrate diet, exercise, weight loss is recommended in order to prevent progression to type 2 diabetes mellitus. - POCT glycosylated hemoglobin (Hb A1C)  2. Essential hypertension Uncontrolled due to the fact that she was only taking losartan and has not had amlodipine Prescription for amlodipine sent to the pharmacy and educated that she needs to be on both antihypertensive We will recheck blood pressure at next visit Counseled on blood pressure goal of less than 130/80, low-sodium, DASH diet, medication compliance, 150 minutes of moderate intensity exercise per week. Discussed medication compliance, adverse effects. - amLODipine (NORVASC) 10 MG tablet; Take 1 tablet (10 mg total) by mouth daily.  Dispense: 90 tablet; Refill: 1 - losartan (COZAAR) 25 MG tablet; Take 1 tablet (25 mg total) by mouth daily.  Dispense: 90 tablet; Refill: 1 - Basic Metabolic Panel  3. Pain, dental - Ambulatory referral to Dentistry - amoxicillin (AMOXIL) 500 MG capsule; Take 1 capsule (500 mg total) by mouth 3 (three) times daily for 10 days.  Dispense: 30 capsule; Refill: 0  4. Abnormal vision - Ambulatory referral to Ophthalmology  5. Need for immunization against influenza - Flu Vaccine QUAD  38mo+IM (Fluarix, Fluzone & Alfiuria Quad PF)    Meds ordered this encounter  Medications   amLODipine (NORVASC) 10 MG tablet    Sig: Take 1 tablet (10 mg total) by mouth daily.    Dispense:  90 tablet    Refill:  1   losartan (COZAAR) 25 MG tablet    Sig: Take 1 tablet (25 mg total) by mouth daily.    Dispense:  90 tablet    Refill:  1   amoxicillin (AMOXIL) 500 MG capsule    Sig: Take 1 capsule (500 mg total) by mouth 3 (three) times daily for 10 days.    Dispense:  30 capsule    Refill:  0    Follow-up: Return in about 1 month (around 10/02/2022) for Pap smear, Blood Pressure follow-up.       Hoy Register, MD, FAAFP. [facility] [place], [place] [phone]   09/01/2022, 12:38 PM

## 2022-09-02 LAB — BASIC METABOLIC PANEL
BUN/Creatinine Ratio: 19 (ref 9–23)
BUN: 8 mg/dL (ref 6–24)
CO2: 20 mmol/L (ref 20–29)
Calcium: 8.6 mg/dL — ABNORMAL LOW (ref 8.7–10.2)
Chloride: 103 mmol/L (ref 96–106)
Creatinine, Ser: 0.42 mg/dL — ABNORMAL LOW (ref 0.57–1.00)
Glucose: 84 mg/dL (ref 70–99)
Potassium: 4 mmol/L (ref 3.5–5.2)
Sodium: 140 mmol/L (ref 134–144)
eGFR: 124 mL/min/{1.73_m2} (ref >59)

## 2022-09-03 ENCOUNTER — Encounter (HOSPITAL_COMMUNITY): Payer: Self-pay | Admitting: Emergency Medicine

## 2022-09-03 ENCOUNTER — Ambulatory Visit (HOSPITAL_COMMUNITY)
Admission: EM | Admit: 2022-09-03 | Discharge: 2022-09-03 | Disposition: A | Discharge status: Home / Self Care | Payer: Commercial Managed Care - HMO | Attending: Internal Medicine | Admitting: Internal Medicine

## 2022-09-03 ENCOUNTER — Other Ambulatory Visit (HOSPITAL_COMMUNITY): Payer: Self-pay

## 2022-09-03 DIAGNOSIS — M542 Cervicalgia: Secondary | ICD-10-CM

## 2022-09-03 DIAGNOSIS — G43001 Migraine without aura, not intractable, with status migrainosus: Secondary | ICD-10-CM

## 2022-09-03 MED ORDER — METOCLOPRAMIDE HCL 5 MG/ML IJ SOLN
INTRAMUSCULAR | Status: AC
  Filled 2022-09-03: qty 2

## 2022-09-03 MED ORDER — METOCLOPRAMIDE HCL 5 MG/ML IJ SOLN: 10.0000 mg | Freq: Once | INTRAMUSCULAR | Status: DC

## 2022-09-03 MED ORDER — CYCLOBENZAPRINE HCL 10 MG PO TABS
10.0000 mg | ORAL_TABLET | Freq: Every evening | ORAL | 0 refills | Status: DC | PRN
  Filled 2022-09-03: qty 10, 10d supply, fill #0

## 2022-09-03 MED ORDER — KETOROLAC TROMETHAMINE 30 MG/ML IJ SOLN
INTRAMUSCULAR | Status: AC
  Filled 2022-09-03: qty 1

## 2022-09-03 MED ORDER — KETOROLAC TROMETHAMINE 30 MG/ML IJ SOLN
30.0000 mg | Freq: Once | INTRAMUSCULAR | Status: AC
  Administered 2022-09-03: 30 mg via INTRAMUSCULAR

## 2022-09-03 MED ORDER — METOCLOPRAMIDE HCL 5 MG/ML IJ SOLN
10.0000 mg | Freq: Once | INTRAMUSCULAR | Status: AC
  Administered 2022-09-03: 10 mg via INTRAMUSCULAR

## 2022-09-03 MED ORDER — IBUPROFEN 600 MG PO TABS
600.0000 mg | ORAL_TABLET | Freq: Three times a day (TID) | ORAL | 0 refills | Status: DC | PRN
  Filled 2022-09-03: qty 30, 10d supply, fill #0

## 2022-09-03 MED ORDER — SUMATRIPTAN SUCCINATE 50 MG PO TABS
50.0000 mg | ORAL_TABLET | ORAL | 0 refills | Status: DC
  Filled 2022-09-03: qty 9, 30d supply, fill #0

## 2022-09-03 MED ORDER — SUMATRIPTAN SUCCINATE 6 MG/0.5ML ~~LOC~~ SOLN
SUBCUTANEOUS | Status: AC
  Filled 2022-09-03: qty 0.5

## 2022-09-03 MED ORDER — SUMATRIPTAN SUCCINATE 6 MG/0.5ML ~~LOC~~ SOLN
6.0000 mg | Freq: Once | SUBCUTANEOUS | Status: AC
  Administered 2022-09-03: 6 mg via SUBCUTANEOUS

## 2022-09-03 NOTE — Discharge Instructions (Addendum)
Please take medications as prescribed Please get some rest Please do not drive or operate heavy machinery after taking muscle relaxants because the muscle relaxant will make you drowsy If you have worsening headaches please go to the emergency department for further evaluation.

## 2022-09-03 NOTE — ED Provider Notes (Signed)
[place]    CSN: [id number] Arrival date & time: 09/03/22  1007      History   Chief Complaint Chief Complaint  Patient presents with   Headache    HPI Carolyn Ramsey is a 46 y.o. female comes to the urgent care with severe headache of 4 days duration.  Patient describes the headache as constant and associated with left-sided neck pain.  Pain started on the back of the head and is currently settled on the left forehead behind the left eye.  Pain is aggravated by noise.  She denies worsening pain with bright lights.  No known relieving factors.  She has tried over the counter medications with no improvement in symptoms.  She denies any numbness, tingling, extremity weakness, double vision or blurry vision.  No floaters in the visual field.  Patient had 3 episodes of emesis yesterday.  She denies any neck stiffness.  She endorses some sore throat which is currently resolved.  No sick contacts.  Patient denies any fall or trauma.  She has had an episode of headache in the past.  HPI  Past Medical History:  Diagnosis Date   Hypertension    Spinal headache     Patient Active Problem List   Diagnosis Date Noted   Obesity 05/18/2019   Language barrier 08/06/2016   Essential hypertension 11/14/2015   Female circumcision 08/17/2012    Past Surgical History:  Procedure Laterality Date   NO PAST SURGERIES      OB History     Gravida  5   Para  5   Term  5   Preterm  0   AB  0   Living  5      SAB  0   IAB  0   Ectopic  0   Multiple  0   Live Births  2            Home Medications    Prior to Admission medications   Medication Sig Start Date End Date Taking? Authorizing Provider  ibuprofen (ADVIL) 600 MG tablet Take 1 tablet (600 mg total) by mouth every 8 (eight) hours as needed for headache. 09/03/22  Yes Dayln Tugwell, Myrene Galas, MD  SUMAtriptan (IMITREX) 50 MG tablet Take 1 tablet (50 mg total) by mouth once for 1 dose. May repeat in 2  hours if headache persists or recurs. 09/03/22 09/04/22 Yes Jerzie Bieri, Myrene Galas, MD  amLODipine (NORVASC) 10 MG tablet Take 1 tablet (10 mg total) by mouth daily. 09/01/22   Charlott Rakes, MD  cyclobenzaprine (FLEXERIL) 10 MG tablet Take 1 tablet (10 mg total) by mouth at bedtime as needed for muscle spasms. 09/03/22   LampteyMyrene Galas, MD  diclofenac Sodium (VOLTAREN) 1 % GEL Apply 4 g topically 4 (four) times daily. 01/06/21   Charlott Rakes, MD  levonorgestrel (LILETTA, 52 MG,) 18.6 MCG/DAY IUD IUD 1 Intra Uterine Device (1 each total) by Intrauterine route once. 02/09/17 04/21/23  Leftwich-Kirby, Kathie Dike, CNM  losartan (COZAAR) 25 MG tablet Take 1 tablet (25 mg total) by mouth daily. 09/01/22   Charlott Rakes, MD  olopatadine (PATANOL) 0.1 % ophthalmic solution Place 1 drop into both eyes 2 (two) times daily. 02/25/22   Charlott Rakes, MD  omeprazole (PRILOSEC) 40 MG capsule Take 1 capsule (40 mg total) by mouth daily for 14 days. 12/26/21 01/09/22  Rondel Oh, MD    Family History Family History  Problem Relation Age of Onset   Healthy Father  Healthy Mother     Social History Social History   Tobacco Use   Smoking status: Never   Smokeless tobacco: Never  Vaping Use   Vaping Use: Never used  Substance Use Topics   Alcohol use: No    Alcohol/week: 0.0 standard drinks of alcohol   Drug use: No     Allergies   Patient has no known allergies.   Review of Systems Review of Systems  HENT: Negative.    Respiratory: Negative.    Cardiovascular: Negative.   Gastrointestinal:  Positive for nausea and vomiting. Negative for abdominal pain and diarrhea.  Neurological:  Positive for headaches. Negative for light-headedness.     Physical Exam Triage Vital Signs ED Triage Vitals  Enc Vitals Group     BP 09/03/22 1156 (!) 157/98     Pulse Rate 09/03/22 1156 90     Resp 09/03/22 1156 17     Temp 09/03/22 1156 97.6 F (36.4 C)     Temp Source 09/03/22 1156 Oral     SpO2  09/03/22 1156 100 %     Weight --      Height --      Head Circumference --      Peak Flow --      Pain Score 09/03/22 1153 10     Pain Loc --      Pain Edu? --      Excl. in [place]? --    No data found.  Updated Vital Signs BP (!) 157/98 (BP Location: Right Arm)   Pulse 90   Temp 97.6 F (36.4 C) (Oral)   Resp 17   SpO2 100%   Visual Acuity Right Eye Distance:   Left Eye Distance:   Bilateral Distance:    Right Eye Near:   Left Eye Near:    Bilateral Near:     Physical Exam Vitals and nursing note reviewed.  Constitutional:      General: She is in acute distress.     Appearance: She is well-developed. She is not ill-appearing.  Eyes:     General: No visual field deficit. Cardiovascular:     Rate and Rhythm: Normal rate and regular rhythm.     Heart sounds: Normal heart sounds.  Pulmonary:     Effort: Pulmonary effort is normal.     Breath sounds: Normal breath sounds.  Abdominal:     Palpations: Abdomen is soft.  Musculoskeletal:        General: Normal range of motion.  Neurological:     Mental Status: She is alert and oriented to person, place, and time.     GCS: GCS eye subscore is 4. GCS verbal subscore is 5. GCS motor subscore is 6.     Cranial Nerves: No cranial nerve deficit, dysarthria or facial asymmetry.     Sensory: No sensory deficit.     Motor: No weakness.     Deep Tendon Reflexes: Reflexes normal.     Comments: No focal neurologic deficits.  Power is 5/5 in both upper and lower extremities.  No sensory deficits.  Psychiatric:        Mood and Affect: Mood normal.        Behavior: Behavior normal.      UC Treatments / Results  Labs (all labs ordered are listed, but only abnormal results are displayed) Labs Reviewed - No data to display  EKG   Radiology No results found.  Procedures Procedures (including critical care time)  Medications Ordered  in UC Medications  ketorolac (TORADOL) 30 MG/ML injection 30 mg (has no administration in  time range)  metoCLOPramide (REGLAN) injection 10 mg (has no administration in time range)  SUMAtriptan (IMITREX) injection 6 mg (has no administration in time range)    Initial Impression / Assessment and Plan / UC Course  I have reviewed the triage vital signs and the nursing notes.  Pertinent labs & imaging results that were available during my care of the patient were reviewed by me and considered in my medical decision making (see chart for details).     1.  Acute migrainous headache without an aura: Toradol 30 mg IM x1 dose Reglan 10 mg IM x1 dose Imitrex 6 mg x 1 dose Ibuprofen as needed for pain and oral Imitrex to be used at home Return to ED precautions given.  Final Clinical Impressions(s) / UC Diagnoses   Final diagnoses:  Migraine without aura and with status migrainosus, not intractable     Discharge Instructions      Please take medications as prescribed Please get some rest Please do not drive or operate heavy machinery after taking muscle relaxants because the muscle relaxant will make you drowsy If you have worsening headaches please go to the emergency department for further evaluation.   ED Prescriptions     Medication Sig Dispense Auth. Provider   SUMAtriptan (IMITREX) 50 MG tablet Take 1 tablet (50 mg total) by mouth once for 1 dose. May repeat in 2 hours if headache persists or recurs. 10 tablet Kolbee Stallman, Myrene Galas, MD   ibuprofen (ADVIL) 600 MG tablet Take 1 tablet (600 mg total) by mouth every 8 (eight) hours as needed for headache. 30 tablet Sulamita Lafountain, Myrene Galas, MD   cyclobenzaprine (FLEXERIL) 10 MG tablet Take 1 tablet (10 mg total) by mouth at bedtime as needed for muscle spasms. 10 tablet Wanetta Funderburke, Myrene Galas, MD      PDMP not reviewed this encounter.   Chase Picket, MD 09/03/22 1249

## 2022-09-03 NOTE — ED Triage Notes (Signed)
Pt c/o severe headache that started 4 days ago. Reports that headache has been constant. Taken medications for HTN and headache OTC medication but didn't help. Pt c/o neck pains as well that started the same time. Denies falls or injury.

## 2022-09-24 ENCOUNTER — Ambulatory Visit: Payer: Commercial Managed Care - HMO

## 2022-09-24 ENCOUNTER — Other Ambulatory Visit: Payer: Self-pay

## 2022-09-24 VITALS — BP 149/103 | HR 94 | Wt 231.0 lb

## 2022-09-24 DIAGNOSIS — Z30431 Encounter for routine checking of intrauterine contraceptive device: Secondary | ICD-10-CM | POA: Diagnosis not present

## 2022-09-24 NOTE — Progress Notes (Signed)
   GYNECOLOGY CLINIC PROGRESS NOTE  History:  46 y.o. [id number] here at [facility] today initially presenting for IUD removal and replacement. Patient had Mirena placed 5 years ago. No issues. I discussed with patient that Mirena has been approved for eight years at preventing pregnancy and does not need to be removed/replaced today. She was unaware of the changes. She is agreeable with this plan of care but desires to have IUD strings check. No complaints about the IUD, no concerning side effects.  The following portions of the patient's history were reviewed and updated as appropriate: allergies, current medications, past family history, past medical history, past social history, past surgical history and problem list. Last pap smear on 08/21/2019 was normal, negative HRHPV.  Review of Systems:  Pertinent items are noted in HPI.   Objective:  Physical Exam Blood pressure (!) 149/103, pulse 94, weight 231 lb (104.8 kg). Gen: NAD Abd: Soft, nontender and nondistended Pelvic: Normal appearing external genitalia; normal appearing vaginal mucosa and cervix.  IUD strings visualized, about 3 cm in length outside cervix.   Assessment & Plan:  1. IUD check up Normal IUD check. Patient to keep IUD in place for additional three years; can come in for removal if she desires pregnancy within the next three years. Routine preventative health maintenance measures emphasized Pap smear up to date Mammogram completed 03/25/2022   Renee Harder, CNM 12:12 PM

## 2022-10-15 ENCOUNTER — Encounter: Payer: Self-pay | Admitting: Family Medicine

## 2022-10-15 ENCOUNTER — Other Ambulatory Visit (HOSPITAL_COMMUNITY)
Admission: RE | Admit: 2022-10-15 | Discharge: 2022-10-15 | Disposition: A | Discharge status: Home / Self Care | Payer: Commercial Managed Care - HMO | Source: Ambulatory Visit | Attending: Family Medicine | Admitting: Family Medicine

## 2022-10-15 ENCOUNTER — Ambulatory Visit: Payer: Commercial Managed Care - HMO | Attending: Family Medicine | Admitting: Family Medicine

## 2022-10-15 ENCOUNTER — Other Ambulatory Visit (HOSPITAL_COMMUNITY): Payer: Self-pay

## 2022-10-15 VITALS — BP 142/93 | HR 91 | Temp 97.9°F | Ht 67.0 in | Wt 228.8 lb

## 2022-10-15 DIAGNOSIS — I10 Essential (primary) hypertension: Secondary | ICD-10-CM | POA: Diagnosis not present

## 2022-10-15 DIAGNOSIS — Z124 Encounter for screening for malignant neoplasm of cervix: Secondary | ICD-10-CM | POA: Diagnosis present

## 2022-10-15 DIAGNOSIS — Z01411 Encounter for gynecological examination (general) (routine) with abnormal findings: Secondary | ICD-10-CM | POA: Diagnosis not present

## 2022-10-15 MED ORDER — LOSARTAN POTASSIUM 50 MG PO TABS
50.0000 mg | ORAL_TABLET | Freq: Every day | ORAL | 1 refills | Status: DC
  Filled 2022-10-15: qty 90, 90d supply, fill #0
  Filled 2023-01-22: qty 90, 90d supply, fill #1

## 2022-10-15 NOTE — Patient Instructions (Signed)
???? ??? ??????? DASH Eating Plan ???? ??? (Dash) ??????? ??????? Dietary Approaches to Stop Hypertension ???? ???? ???? ????? ???? ??? ?????. ????? ??? ??????? ??? ????? ???? ???? ???????? ??:  ??? ?????? ??? ????? ?????? (??? ??? ????).  ????? ??? ??????? ???? ?????? ?? ????? ?????? ?????? ????? ??????? ????????.  ???????? ?? ????? ??????. ?? ??????? ??????? ?????? ??? ?????? ????? ?????? ????????? ????????  ???? ?????? ??????? ????? ???? ????? (????????) ?? ????? ???????. ???? ??????? ???? ????? ??? ??? ?? 5 ?????? ?? ???????? ?? ????? ???????. ???? ?????? ???? ??????? ???? ????? ??? ??? ?? 300 ??????? (????) ?? ????????? ?? ??? ????? ?????? ???? ?????? ???????.  ????? ??? ?????? ???????? ???? ?? ???? "?????" ???? ???? ?? ????? ????????. ??????  ????? ???????? ??????? ????? "?????? ???" ?? "??? ????? ???".  ????? ??????? ???????. ???? ??????? ??????? ????????? ?????? ?? ??????? ????????. ?????  ???? ????? ????? ??? ?????. ?????? ??????? ?? ??????? ??????? ?? ?????? ????? ?? ??? ?????? ?? ??? ??????. ???? ???? ??????? ?????? ?? ??????? ??? ??????? ????? ?????.  ?? ?????? ??????? ???????. ???? ??? ????? ?????? ???? ?????? ????? ?? ???? ????? ????? ?????? ???????? ?????? ???????.  ??????? ???? ????? ??????? ???? ?????? ??? ??? ??????? ?? ???????? ?? ????????? ?? ??? ?????? ?? ???? ??????. ????? ???????   ????? ????? ????? ?????? ?????: ? ????? 4 ??? ?? ???? ?? ??????? ?4 ??? ?? ???? ?? ????????? ?? ????. ???? ??? ??? ???? ???????? ?????????. ? 6-8 ??? ?? ?????? ??????? ?? ???. ? ??? ?? 6 ?????? (170 ??) ?? ????? ?????? ?? ????? ?? ??????? ?? ??????? ?? ???. ??? ??? ???? 3 ?????? (85 ??) ????? ??? ?????. ???? ????? ????? ????? ????? (1) (28 ??). ? 2-3 ??? ?? ??????? ????? ????? ?? ???. ????? ??????? ???? ???? (237 ??). ? ??? ????? (1) ?? ???????? ?? ?????? ?? ????????? 5 ???? ?? ????????. ? 2-3 ??? ?? ?????? ?????? ?????. ???? ?????? ?????? ???? ???? ??????? ??????? ?????? 3 ?? ??????? ???  ????? ????? ?????? ?????? ?????? ??????. ????? ??? ?????? ???? ?? ????? ?????? ??????? ??? ??????? ???????? ?????????.  ???? ?? ???? ??????? ???? ???? ??????? ??: ? ??????? ??????? ?? ???????. ? ??????? ???? ????? ??? ????? ????? ?? ?????? ??? ???????? ??? ??? ??????? ???????. ? ??????? ???? ????? ??? ????? ????? ?? ?????? ???????? ??? ?????? ??????. ? ???????? ????????? ?????????? ??????? ?????? ???????? ?????? ?????? ????? ??????. ? ?????? ??????? ????? ?????.  ?? ??? ????? ??? ?????? ??? ?????.  ?? ???? ???? ?? 4 ???? ??? ????????.  ???? ????? ?? ?? ??? ?? ?????? (2) ???????? ?? ?????.  ???? ?? ????? ??????? ???????? ???? ?? ????? ????? ??????? ???????? ???????? ???????. ??? ??????  ??? ????? ?????? ?? ??? ???????? ???? ???? ????? ????? ???? ??? ?? ??? ??? ?? ????.  ??? ??? ?????? ????????? ????????: ? ??? ???? ?????? ????????? ??? ????? ??????: ? 0-1 ????? ?????? ?????? ??? ???????. ? ??????? ??? ???? ??????. ? ????? ??? ???? ?????? ?? ??????. ?? ???????? ???????? ????? ??????? ?????? ???? ??? 12 ????? (355 ??) ?? ?????? ?? ??? ??? 5 ?????? (148 ??) ?? ?????? ?? ??? ??? ????? ???? (44 ??) ?? ????????? ???????? ??????. ??????? ????  ???? ????? ???? ?? 2300 ???? ?? ????? ??????. ??? ??? ????? ?? ??? ?????? ??? ????? ???? ????? ???? ???????? ???? ???????? ??? 1500 ???? ??????.  ????? ?? ???? ??????? ?????? ??????? ?? ?????? ??? ??? ??? ?? ?????? ?????. ???? ?? ????? ??????? ??????? ??.  ???? ????? ?????? ????   30 ????? ??? ????? ?????? ????? ???? (???????? ???????? ????????) ?? ???? ???? ???????. ?? ???? ??????? ???????? ????? ?? ??????? ?? ???? ???????.  ????? ?? ???? ??????? ?????? ?? ????? ??????? ????? ?? ?????? ????? ??????? ??????? ?? ????????? ??????? ?? ??????? ????????. ?? ??????? ???? ??? ?? ????????? ??????? ??????? ??????? ?? ??????? ?? ???????. ??????? ???????? ?? ???? ????? (??? ????? ???). ????????? ???????? ??????? ?? ??????? (?????? ?? ??????? ?? ??????? ?? ???????). ?????  ????????? ???????? ?????? ???????? ?? ????? ????????. ????????? ????? ??????? ?????? ???????? ?? ????? ????????. ????????? ??????? ?????? ???????? ?? ????? ????????. ?????? ??? ????? ?????? ?? ?????? ???????. ??????? ????? ?????? ?? ?????? ???????. ????? ?????. ???????. ???????. ?????? ([place]). ???? ???????? ???????? ?? ?????? ??????? ?????? ????????. ????? ??????. ????????? ????? ????? ?????? ????????. ??? ????????? ?????? ??????. ?????? ??????????? ?????? ?????? ?? ????? ????? ?????? ????? ?????. ?????? ?? ????? ?????? ????? ?????. ????? ??????? ?????. ????? ?????????? ???????. ???? ?????. ????????? ?????? ????????? ?? ?????. ???????? ??? ??????? ????? ???????? ???????. ????? ?????? ?????? ?? ?????. ??? ????? ???? ????? ??? ????? ?? ??? ?? ????. ?????? ??????? ?? ?????? ????? ??????? ?? ???????? ?????? ???????? ??? ??????? ?? ????? ?????. ?????? ??????? ?????? ???? ????? (1%) ?? ?????? ?? ????? (????? ?????). ??????? ??????? ?? ?????? ?? ????? ?????? ?? ?????? ??????. ????? ?????? ?? ???????? ??????? ?? ????? ????? ????????. ????? ??????? ???? ????? ?? ?????? ?? ?????. ?????? ????? ????? ?? ??????? ?? ?????. ?????? ??????? ????? ??????? ??? ???? ??????. ???? ?????????. ????????? ??????? ??????? ??????? ?? ?????? ?? ?????? ?????? (?????? ????????). ???? ???????? ????? ????? ???????? ?????????? ???? ?????? ????? ??????. ?????????. ??????? ????????? ???????. ????????. ????? ??????? ??? ???. ??????? ?????? ?????? ?????? ????? ??? ??????. ???????? ??????? ?? ??????. ???? ?? ???? ??????? ??????? ????? ??????? ??????? ?? ????????? ?????????? ???? ????? ???????. ??? ????? ??????? ??????? ?????? ?????? ?? ?????????. ?? ??????? ?????? ??????? ??????? ??????? ??????? ?? ???? ??? ???? ?? ????. ??????? ???????. ??????? ????? ?????? ?? ???????. ????????? ???????? ??????? ??????? ?? ???????. ????????? ??????? ????? ?????. ????????? ??????? ??????? (??? ?????? ???????? ?? ??? ????? ????????). ????? ????? ?????  ??????? ??????? (??? ?????? ???????? ?? ??? ????? ????????). ????? ????????? ???????? ??????? (??? ?????? ???????? ?? ??? ????? ????????). ????. ???????. ?????? ????????? ???????? ???????? ??? ????????? ?? ?????? ?? ??? ????? ?????. ????? ????? ????????? ?? ????? ??????. ?????? ??????????? ?????? ??? ????? ?????? ??????. ???????. ?????? ???????. ??? ???????. ????????? ???????? ?????? ?? ?????? ???????? ???????? ?? ????? ??????. ?????? ?? ????? ?????? ?? ??????? (??? ???????). ????????? (??? ?? ??? ??????? ???? ??????). ?????? ????????? ???????. ????????? ??????? ?? ????? ?????. ??????? ??????? ?? ???????. ????? ?????? ?? ???? ?????. ?????? ?? ????? ?????? ?? ?????. ?????? ??????? ?????? ???? ????? ?? ???? 2%? ??????? ?????? ??? ???. ????? ??????? ?????? ?? ???? ?????. ??????? ???? ????? ?? ??????. ??????? ????? ?????. ???? ?????? ?????? ?? ???????. ????? ???????. ????? ??????? ????????. ?????? ??????? ??????. ????? ?????. ???? (??? ?????). ???????? (??? ????? ???? ?????? ?? ???????? ?????????). ????? ????????. ???? ??? ???????. ?????? ??????????? ??? ??? ??? ????? ?? ???? ?????? ?? ??????. ??????? ????????? ??? ????? ?????? ?????? ?????? ???? ??????? ???? ?????. ???? ???????. ???? ????????. ???? ??????. ???? ????????. ???? ??????? ????? ?????? ????? ????????. ???? ????? ?????. ??? ????? ?????? ???????. ???? ?????. ???? ??????. ???? ????????. ????? ????? ?? ???????. ???????. ???? ??????. ????? ??????? ??????. ?????? ??????. ?????? ?????? ????????. ????? ??????? ??????? ?????? ?? ???????. ??????? ?????? ??????? ?????? ?? ???????. ???????. ???? ?????? ???????. ??????? ?????? ?????? ?????? ????? ??????. ???? ?? ???? ??????? ??????? ????? ??????? ??????? ?? ????????? ?????????? ???? ????? ???? ??????. ??? ????? ??????? ??????? ?????? ?????? ?? ?????????. ????? ?????? ??? ?????? ?? ?????????  ?????? ?????? ????? ?????? ????? (  National Heart, Lung, and Blood Institute): [email or web address]  ????? ?????  ????????? ? (American Heart Association): [email or web address]  ???????? ??????? ???????? ???????? (Academy of Nutrition and Dietetics): [email or web address]  ??????? ??????? ????? (National Kidney Foundation): [email or web address] ????  ???? ??? ??????? (DASH) ??? ????? ???? ???? ???????? ?? ??? ??? ???? ??????? (?????? ??? ????). ??? ???? ?? ???? ?? ??? ??????? ???? ?????? ????? ?????? ?????? ????? ??????? ????????.  ??? ??? ???? ??? ???????? ???? ??? ????? ?????? ?? ??????? ?????????? ??????? ??????? ??????? ??????????? ??????? ?? ?????? ???????? ????? ????? ??????? ??????? ???? ?????.  ??? ????? ???? ??? ???????? ??? ????? ????? ????? (????????) ???????? ??? 2300 ???? ?? ?????. ??? ??? ????? ?? ??? ?????? ??? ????? ???? ????? ???? ???????? ???? ???????? ??? 1500 ???? ??????.  ????? ?? ???? ??????? ?????? ?? ????? ??????? ????? ?? ?????? ????? ??????? ??????? ?? ????????? ??????? ?? ??????? ????????. ??? ????? ?? ??? ????????? ?? ???? ?????? ????????? ???? ?????? ???? ??????? ??????. ???? ?? ?????? ??? ????? ???? ?? ???? ?? ???? ??????? ??????.? Document Revised: 12/20/2019 Document Reviewed: 12/20/2019 Elsevier Patient Education  2023 Elsevier Inc.  

## 2022-10-15 NOTE — Progress Notes (Signed)
Subjective:  Patient ID: Carolyn Ramsey, female    DOB: 1976-06-11  Age: 46 y.o. MRN: [id number]  CC: Gynecologic Exam   HPI Carolyn Ramsey is a 46 y.o. year old female with a history of hypertension, and chronic headaches. Presents for a gynecologic exam today.  Interval History: She is due for a Pap smear and mammogram.  Mammogram was ordered in 01/2022 however she never followed up with this.  At her last visit her blood pressure was elevated and she had only been taking losartan.  Advised to also take amlodipine along with losartan and blood pressure shows a slight improvement today. She denies additional concerns today. Past Medical History:  Diagnosis Date   Hypertension    Spinal headache     Past Surgical History:  Procedure Laterality Date   NO PAST SURGERIES      Family History  Problem Relation Age of Onset   Healthy Father    Healthy Mother     Social History   Socioeconomic History   Marital status: Married    Spouse name: Not on file   Number of children: Not on file   Years of education: Not on file   Highest education level: Not on file  Occupational History   Not on file  Tobacco Use   Smoking status: Never   Smokeless tobacco: Never  Vaping Use   Vaping Use: Never used  Substance and Sexual Activity   Alcohol use: No    Alcohol/week: 0.0 standard drinks of alcohol   Drug use: No   Sexual activity: Yes    Birth control/protection: I.U.D.  Other Topics Concern   Not on file  Social History Narrative   Lives with husband and 4 children.  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    Social Determinants of Health   Financial Resource Strain: Not on file  Food Insecurity: Not on file  Transportation Needs: Not on file  Physical Activity: Not on file  Stress: Not on file  Social Connections: Not on file    No Known Allergies  Outpatient Medications Prior to Visit  Medication Sig Dispense Refill   amLODipine  (NORVASC) 10 MG tablet Take 1 tablet (10 mg total) by mouth daily. 90 tablet 1   cyclobenzaprine (FLEXERIL) 10 MG tablet Take 1 tablet (10 mg total) by mouth at bedtime as needed for muscle spasms. 10 tablet 0   diclofenac Sodium (VOLTAREN) 1 % GEL Apply 4 g topically 4 (four) times daily. 100 g g   ibuprofen (ADVIL) 600 MG tablet Take 1 tablet (600 mg total) by mouth every 8 (eight) hours as needed for headache. 30 tablet 0   levonorgestrel (LILETTA, 52 MG,) 18.6 MCG/DAY IUD IUD 1 Intra Uterine Device (1 each total) by Intrauterine route once. 1 each 0   olopatadine (PATANOL) 0.1 % ophthalmic solution Place 1 drop into both eyes 2 (two) times daily. 5 mL 1   SUMAtriptan (IMITREX) 50 MG tablet Take 1 tablet (50 mg total) by mouth once for 1 dose. May repeat in 2 hours if headache persists or recurs. 10 tablet 0   losartan (COZAAR) 25 MG tablet Take 1 tablet (25 mg total) by mouth daily. 90 tablet 1   omeprazole (PRILOSEC) 40 MG capsule Take 1 capsule (40 mg total) by mouth daily for 14 days. 14 capsule 0   No facility-administered medications prior to visit.     ROS Review of Systems  Constitutional:  Negative for  activity change and appetite change.  HENT:  Negative for sinus pressure and sore throat.   Respiratory:  Negative for chest tightness, shortness of breath and wheezing.   Cardiovascular:  Negative for chest pain and palpitations.  Gastrointestinal:  Negative for abdominal distention, abdominal pain and constipation.  Genitourinary: Negative.   Musculoskeletal: Negative.   Psychiatric/Behavioral:  Negative for behavioral problems and dysphoric mood.     Objective:  BP (!) 142/93   Pulse 91   Temp 97.9 F (36.6 C) (Oral)   Ht 5\' 7"  (1.702 m)   Wt 228 lb 12.8 oz (103.8 kg)   SpO2 100%   BMI 35.84 kg/m      10/15/2022   10:43 AM 09/24/2022    9:58 AM 09/03/2022   11:56 AM  BP/Weight  Systolic BP 142 149 157  Diastolic BP 93 103 98  Wt. (Lbs) 228.8 231   BMI 35.84  kg/m2 36.18 kg/m2       Physical Exam Exam conducted with a chaperone present.  Constitutional:      General: She is not in acute distress.    Appearance: She is well-developed. She is not diaphoretic.  HENT:     Head: Normocephalic.     Right Ear: External ear normal.     Left Ear: External ear normal.     Nose: Nose normal.  Eyes:     Conjunctiva/sclera: Conjunctivae normal.     Pupils: Pupils are equal, round, and reactive to light.  Neck:     Vascular: No JVD.  Cardiovascular:     Rate and Rhythm: Normal rate and regular rhythm.     Heart sounds: Normal heart sounds. No murmur heard.    No gallop.  Pulmonary:     Effort: Pulmonary effort is normal. No respiratory distress.     Breath sounds: Normal breath sounds. No wheezing or rales.  Chest:     Chest wall: No tenderness.  Breasts:    Right: Normal. No mass, nipple discharge or tenderness.     Left: Normal. No mass, nipple discharge or tenderness.  Abdominal:     General: Bowel sounds are normal. There is no distension.     Palpations: Abdomen is soft. There is no mass.     Tenderness: There is no abdominal tenderness.     Hernia: There is no hernia in the left inguinal area or right inguinal area.  Genitourinary:    General: Normal vulva.     Pubic Area: No rash.      Labia:        Right: No rash.        Left: No rash.      Vagina: Normal.     Cervix: Normal.     Uterus: Normal.      Adnexa: Right adnexa normal and left adnexa normal.       Right: No tenderness.         Left: No tenderness.    Musculoskeletal:        General: No tenderness. Normal range of motion.     Cervical back: Normal range of motion. No tenderness.  Lymphadenopathy:     Upper Body:     Right upper body: No supraclavicular or axillary adenopathy.     Left upper body: No supraclavicular or axillary adenopathy.  Skin:    General: Skin is warm and dry.  Neurological:     Mental Status: She is alert and oriented to person, place, and  time.  Deep Tendon Reflexes: Reflexes are normal and symmetric.        Latest Ref Rng & Units 09/01/2022   11:42 AM 02/25/2022    9:51 AM 08/27/2021   11:23 AM  CMP  Glucose 70 - 99 mg/dL 84  536  87   BUN 6 - 24 mg/dL 8  8  8    Creatinine 0.57 - 1.00 mg/dL  6.44  0.34   Sodium 134 - 144 mmol/L 140  139  140   Potassium 3.5 - 5.2 mmol/L 4.0  3.8  3.9   Chloride 96 - 106 mmol/L 103  102  103   CO2 20 - 29 mmol/L 20  22  23    Calcium 8.7 - 10.2 mg/dL 8.6  9.3  8.8   Total Protein 6.0 - 8.5 g/dL  7.6  7.1   Total Bilirubin 0.0 - 1.2 mg/dL  0.2  0.2   Alkaline Phos 44 - 121 IU/L  70  77   AST 0 - 40 IU/L  16  12   ALT 0 - 32 IU/L  15  12     Lipid Panel     Component Value Date/Time   CHOL 199 02/25/2022 0951   TRIG 45 02/25/2022 0951   HDL 68 02/25/2022 0951   CHOLHDL 3.1 08/27/2021 1123   CHOLHDL 3.1 03/05/2016 1018   VLDL 9 03/05/2016 1018   LDLCALC 123 (H) 02/25/2022 0951    CBC    Component Value Date/Time   WBC 8.0 03/02/2020 1541   RBC 4.61 03/02/2020 1541   HGB 12.2 03/02/2020 1541   HGB 11.4 03/25/2017 1127   HCT 38.7 03/02/2020 1541   HCT 35.0 03/25/2017 1127   PLT 316 03/02/2020 1541   PLT 297 03/25/2017 1127   MCV 83.9 03/02/2020 1541   MCV 82 03/25/2017 1127   MCH 26.5 03/02/2020 1541   MCHC 31.5 03/02/2020 1541   RDW 14.4 03/02/2020 1541   RDW 15.6 (H) 03/25/2017 1127   LYMPHSABS 2.9 03/02/2020 1541   LYMPHSABS 2.3 03/25/2017 1127   MONOABS 0.7 03/02/2020 1541   EOSABS 0.3 03/02/2020 1541   EOSABS 0.2 03/25/2017 1127   BASOSABS 0.1 03/02/2020 1541   BASOSABS 0.0 03/25/2017 1127    Lab Results  Component Value Date   HGBA1C 5.9 (A) 09/01/2022    Assessment & Plan:  1. Encounter for gynecological examination with abnormal finding Counseled on 150 minutes of exercise per week, healthy eating (including decreased daily intake of saturated fats, cholesterol, added sugars, sodium), routine healthcare maintenance. -She has been provided  with the number to the breast center so she can schedule her mammogram.  2. Essential hypertension Uncontrolled Losartan dose increased We will check potassium level in 2 weeks Counseled on blood pressure goal of less than 130/80, low-sodium, DASH diet, medication compliance, 150 minutes of moderate intensity exercise per week. Discussed medication compliance, adverse effects. - losartan (COZAAR) 50 MG tablet; Take 1 tablet (50 mg total) by mouth daily.  Dispense: 90 tablet; Refill: 1 - Basic Metabolic Panel; Future  3. Screening for cervical cancer - Cytology - PAP    Meds ordered this encounter  Medications   losartan (COZAAR) 50 MG tablet    Sig: Take 1 tablet (50 mg total) by mouth daily.    Dispense:  90 tablet    Refill:  1    Dose increase    Follow-up: Return in about 3 months (around 01/15/2023) for Chronic medical conditions.  Hoy Register, MD, FAAFP. [facility] [place], [place] [phone]   10/15/2022, 11:22 AM

## 2022-10-19 LAB — CYTOLOGY - PAP
Comment: NEGATIVE
Diagnosis: NEGATIVE
High risk HPV: NEGATIVE

## 2022-10-29 ENCOUNTER — Ambulatory Visit (HOSPITAL_COMMUNITY)
Admission: EM | Admit: 2022-10-29 | Discharge: 2022-10-29 | Disposition: A | Discharge status: Home / Self Care | Payer: Commercial Managed Care - HMO

## 2022-10-29 ENCOUNTER — Ambulatory Visit: Payer: Commercial Managed Care - HMO | Attending: Family Medicine

## 2022-10-29 ENCOUNTER — Other Ambulatory Visit (HOSPITAL_COMMUNITY): Payer: Self-pay

## 2022-10-29 ENCOUNTER — Encounter (HOSPITAL_COMMUNITY): Payer: Self-pay | Admitting: *Deleted

## 2022-10-29 DIAGNOSIS — I10 Essential (primary) hypertension: Secondary | ICD-10-CM

## 2022-10-29 DIAGNOSIS — M67431 Ganglion, right wrist: Secondary | ICD-10-CM

## 2022-10-29 NOTE — ED Triage Notes (Signed)
Pt states she has had a knot on her right wrist x 1 week it is only painful when she touches it.

## 2022-10-29 NOTE — Discharge Instructions (Addendum)
Wear the wrist brace every day for a week and see if this will get smaller. If it persists you need to see your family doctor so she/he can be referred to ortho.

## 2022-10-29 NOTE — ED Provider Notes (Addendum)
[facility]    CSN: [id number] Arrival date & time: 10/29/22  1336      History   Chief Complaint Chief Complaint  Patient presents with   Cyst    HPI Carolyn Ramsey is a 46 y.o. female who presents due to having a lump on her  R wrist 1 week ago and in only painful with palpation. She has had this before in [place] country on radial wrist region and resolved. She admits that she does repetitive things with her hands.     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 Authorizing Provider  amLODipine (NORVASC) 10 MG tablet Take 1 tablet (10 mg total) by mouth daily. 09/01/22  Yes Newlin, Odette Horns, MD  levonorgestrel (LILETTA, 52 MG,) 18.6 MCG/DAY IUD IUD 1 Intra Uterine Device (1 each total) by Intrauterine route once. 02/09/17 04/21/23 Yes Leftwich-Kirby, Wilmer Floor, CNM  losartan (COZAAR) 50 MG tablet Take 1 tablet (50 mg total) by mouth daily. 10/15/22  Yes Hoy Register, MD  cyclobenzaprine (FLEXERIL) 10 MG tablet Take 1 tablet (10 mg total) by mouth at bedtime as needed for muscle spasms. 09/03/22   LampteyBritta Mccreedy, MD  diclofenac Sodium (VOLTAREN) 1 % GEL Apply 4 g topically 4 (four) times daily. 01/06/21   Hoy Register, MD  ibuprofen (ADVIL) 600 MG tablet Take 1 tablet (600 mg total) by mouth every 8 (eight) hours as needed for headache. 09/03/22   Lamptey, Britta Mccreedy, MD  olopatadine (PATANOL) 0.1 %  ophthalmic solution Place 1 drop into both eyes 2 (two) times daily. 02/25/22   Hoy Register, MD  omeprazole (PRILOSEC) 40 MG capsule Take 1 capsule (40 mg total) by mouth daily for 14 days. 12/26/21 01/09/22  Piontek, Denny Peon, MD  SUMAtriptan (IMITREX) 50 MG tablet Take 1 tablet (50 mg total) by mouth once for 1 dose. May repeat in 2 hours if headache persists or recurs. 09/03/22   Lamptey, Britta Mccreedy, MD    Family History Family History  Problem Relation Age of Onset   Healthy Father    Healthy Mother     Social History Social History   Tobacco Use   Smoking status: Never   Smokeless tobacco: Never  Vaping Use   Vaping Use: Never used  Substance Use Topics   Alcohol use: No    Alcohol/week: 0.0 standard drinks of alcohol   Drug use: No     Allergies   Patient has no known allergies.   Review of Systems Review of Systems  Musculoskeletal:  Negative for arthralgias and joint swelling.       + R wrist lump,  pain with palpation only.  Skin:  Negative for color change, pallor, rash and wound.     Physical Exam Triage Vital Signs ED Triage Vitals  Enc Vitals Group     BP 10/29/22 1501 (!) 161/96     Pulse Rate 10/29/22 1501 86     Resp 10/29/22 1501 18     Temp 10/29/22 1501 98 F (36.7 C)     Temp Source 10/29/22 1501 Oral     SpO2 10/29/22 1501 97 %     Weight --      Height --      Head Circumference --      Peak Flow --      Pain Score 10/29/22 1459 0     Pain Loc --      Pain Edu? --      Excl. in GC? --    No data found.  Updated Vital Signs BP (!) 161/96 (BP Location: Left Arm) Comment: hasnt taken BP meds yet. Is taking them now in office.  Pulse 86   Temp 98 F (36.7 C) (Oral)   Resp 18   LMP 10/29/2022 (Exact Date)   SpO2 97%   Visual Acuity Right Eye Distance:   Left Eye Distance:   Bilateral Distance:    Right Eye Near:   Left Eye Near:    Bilateral Near:     Physical Exam Vitals and nursing note reviewed.  Constitutional:       General: She is not in acute distress.    Appearance: She is not toxic-appearing.  Eyes:     General: No scleral icterus.    Conjunctiva/sclera: Conjunctivae normal.  Pulmonary:     Effort: Pulmonary effort is normal.  Musculoskeletal:        General: Normal range of motion.     Cervical back: Neck supple.     Comments: R WRIST- has 2 cm x 2 cm rubbery mass on central dorsal wrist which is mildly tender with palpation. There is no erythema on the skin on this area. ROM is normal.   Neurological:     Mental Status: She is alert and oriented to person, place, and time.     Gait: Gait normal.  Psychiatric:        Mood and Affect: Mood normal.        Behavior: Behavior normal.      UC Treatments / Results  Labs (all labs ordered are listed, but only abnormal results are displayed) Labs Reviewed - No data to display  EKG   Radiology No results found.  Procedures Procedures (including critical care time)  Medications Ordered in UC Medications - No data to display  Initial Impression / Assessment and Plan / UC Course  I have reviewed the triage vital signs and the nursing notes.  R ganglion cyst  I recommended wrist splint which I was going to have dispense for her, but She left before I had the nurse place her on a brace.  Will need to FU with PCP if cyst gets larger or more painful so she can see ortho.  Final Clinical Impressions(s) / UC Diagnoses  R ganglion cyst Final diagnoses:  Ganglion cyst of dorsum of right wrist     Discharge Instructions      Wear the wrist brace every day for a week and see if this will get smaller. If it persists you need to see your family doctor so she/he can be referred to ortho.  ED Prescriptions   None    PDMP not reviewed this encounter.   Garey Ham, PA-C 10/29/22 1558    Rodriguez-Southworth, [place], PA-C 10/29/22 1559

## 2022-10-30 LAB — BASIC METABOLIC PANEL
BUN/Creatinine Ratio: 23 (ref 9–23)
BUN: 11 mg/dL (ref 6–24)
CO2: 21 mmol/L (ref 20–29)
Calcium: 9.1 mg/dL (ref 8.7–10.2)
Chloride: 102 mmol/L (ref 96–106)
Creatinine, Ser: 0.48 mg/dL — ABNORMAL LOW (ref 0.57–1.00)
Glucose: 91 mg/dL (ref 70–99)
Potassium: 4 mmol/L (ref 3.5–5.2)
Sodium: 138 mmol/L (ref 134–144)
eGFR: 120 mL/min/{1.73_m2} (ref >59)

## 2022-12-01 ENCOUNTER — Ambulatory Visit: Payer: Self-pay | Admitting: Sports Medicine

## 2023-01-20 ENCOUNTER — Ambulatory Visit: Payer: Self-pay | Attending: Family Medicine | Admitting: Family Medicine

## 2023-01-22 ENCOUNTER — Other Ambulatory Visit (HOSPITAL_COMMUNITY): Payer: Self-pay

## 2023-04-19 ENCOUNTER — Ambulatory Visit: Payer: BLUE CROSS/BLUE SHIELD | Attending: Family Medicine | Admitting: Family Medicine

## 2023-04-19 ENCOUNTER — Other Ambulatory Visit: Payer: Self-pay | Admitting: Family Medicine

## 2023-04-19 ENCOUNTER — Other Ambulatory Visit (HOSPITAL_COMMUNITY): Payer: Self-pay

## 2023-04-19 ENCOUNTER — Encounter: Payer: Self-pay | Admitting: Family Medicine

## 2023-04-19 VITALS — BP 133/87 | HR 84 | Temp 98.0°F | Ht 67.0 in | Wt 225.6 lb

## 2023-04-19 DIAGNOSIS — I10 Essential (primary) hypertension: Secondary | ICD-10-CM | POA: Diagnosis not present

## 2023-04-19 DIAGNOSIS — Z1211 Encounter for screening for malignant neoplasm of colon: Secondary | ICD-10-CM | POA: Diagnosis not present

## 2023-04-19 DIAGNOSIS — M722 Plantar fascial fibromatosis: Secondary | ICD-10-CM

## 2023-04-19 DIAGNOSIS — M654 Radial styloid tenosynovitis [de Quervain]: Secondary | ICD-10-CM | POA: Diagnosis not present

## 2023-04-19 MED ORDER — PREDNISONE 20 MG PO TABS
20.0000 mg | ORAL_TABLET | Freq: Every day | ORAL | 0 refills | Status: DC
  Filled 2023-04-19: qty 5, 5d supply, fill #0

## 2023-04-19 MED ORDER — AMLODIPINE BESYLATE 10 MG PO TABS
10.0000 mg | ORAL_TABLET | Freq: Every day | ORAL | 1 refills | Status: DC
  Filled 2023-04-19: qty 90, 90d supply, fill #0

## 2023-04-19 MED ORDER — LOSARTAN POTASSIUM 50 MG PO TABS
50.0000 mg | ORAL_TABLET | Freq: Every day | ORAL | 1 refills | Status: DC
  Filled 2023-04-19: qty 90, 90d supply, fill #0

## 2023-04-19 NOTE — Progress Notes (Signed)
Subjective:  Patient ID: Carolyn Ramsey, female    DOB: 11/15/1976  Age: 47 y.o. MRN: [id number]  CC: Wrist Pain   HPI Carolyn Ramsey is a 47 y.o. year old female with a history of  hypertension, and chronic headaches.   Interval History:  She noticed right wrist lump on the dorsum of her hand which is very painful and radiates to her right thumb with no preceding trauma and this prevents her from sleeping.  Her right heel has also been painful and is worse when she drives and also with her for steps of the morning.  Her blood pressure is slightly elevated and she is yet to take her antihypertensive as she has been fasting. Past Medical History:  Diagnosis Date   Hypertension    Spinal headache     Past Surgical History:  Procedure Laterality Date   NO PAST SURGERIES      Family History  Problem Relation Age of Onset   Healthy Father    Healthy Mother     Social History   Socioeconomic History   Marital status: Married    Spouse name: Not on file   Number of children: Not on file   Years of education: Not on file   Highest education level: Not on file  Occupational History   Not on file  Tobacco Use   Smoking status: Never   Smokeless tobacco: Never  Vaping Use   Vaping Use: Never used  Substance and Sexual Activity   Alcohol use: No    Alcohol/week: 0.0 standard drinks of alcohol   Drug use: No   Sexual activity: Yes    Birth control/protection: I.U.D.  Other Topics Concern   Not on file  Social History Narrative   Lives with husband and 4 children.  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    Social Determinants of Health   Financial Resource Strain: Not on file  Food Insecurity: Not on file  Transportation Needs: Not on file  Physical Activity: Not on file  Stress: Not on file  Social Connections: Not on file    No Known Allergies  Outpatient Medications Prior to Visit  Medication Sig Dispense Refill   cyclobenzaprine  (FLEXERIL) 10 MG tablet Take 1 tablet (10 mg total) by mouth at bedtime as needed for muscle spasms. 10 tablet 0   diclofenac Sodium (VOLTAREN) 1 % GEL Apply 4 g topically 4 (four) times daily. 100 g g   ibuprofen (ADVIL) 600 MG tablet Take 1 tablet (600 mg total) by mouth every 8 (eight) hours as needed for headache. 30 tablet 0   levonorgestrel (LILETTA, 52 MG,) 18.6 MCG/DAY IUD IUD 1 Intra Uterine Device (1 each total) by Intrauterine route once. 1 each 0   olopatadine (PATANOL) 0.1 % ophthalmic solution Place 1 drop into both eyes 2 (two) times daily. 5 mL 1   SUMAtriptan (IMITREX) 50 MG tablet Take 1 tablet (50 mg total) by mouth once for 1 dose. May repeat in 2 hours if headache persists or recurs. 10 tablet 0   amLODipine (NORVASC) 10 MG tablet Take 1 tablet (10 mg total) by mouth daily. 90 tablet 1   losartan (COZAAR) 50 MG tablet Take 1 tablet (50 mg total) by mouth daily. 90 tablet 1   omeprazole (PRILOSEC) 40 MG capsule Take 1 capsule (40 mg total) by mouth daily for 14 days. 14 capsule 0   No facility-administered medications prior to visit.  ROS Review of Systems  Constitutional:  Negative for activity change and appetite change.  HENT:  Negative for sinus pressure and sore throat.   Respiratory:  Negative for chest tightness, shortness of breath and wheezing.   Cardiovascular:  Negative for chest pain and palpitations.  Gastrointestinal:  Negative for abdominal distention, abdominal pain and constipation.  Genitourinary: Negative.   Musculoskeletal:        See HPI  Psychiatric/Behavioral:  Negative for behavioral problems and dysphoric mood.     Objective:  BP 133/87   Pulse 84   Temp 98 F (36.7 C) (Oral)   Ht 5\' 7"  (1.702 m)   Wt 225 lb 9.6 oz (102.3 kg)   SpO2 100%   BMI 35.33 kg/m      04/19/2023    2:35 PM 04/19/2023    2:05 PM 10/29/2022    3:01 PM  BP/Weight  Systolic BP 133 138 161  Diastolic BP 87 92 96  Wt. (Lbs)  225.6   BMI  35.33 kg/m2        Physical Exam Constitutional:      Appearance: She is well-developed.  Cardiovascular:     Rate and Rhythm: Normal rate.     Heart sounds: Normal heart sounds. No murmur heard. Pulmonary:     Effort: Pulmonary effort is normal.     Breath sounds: Normal breath sounds. No wheezing or rales.  Chest:     Chest wall: No tenderness.  Abdominal:     General: Bowel sounds are normal. There is no distension.     Palpations: Abdomen is soft. There is no mass.     Tenderness: There is no abdominal tenderness.  Musculoskeletal:     Right lower leg: No edema.     Left lower leg: No edema.     Comments: Tenderness on palpation of midpoint of dorsum of right hand.  No edema noted. Positive Finkelstein sign. Tenderness on palpation of right heel  Neurological:     Mental Status: She is alert and oriented to person, place, and time.  Psychiatric:        Mood and Affect: Mood normal.        Latest Ref Rng & Units 10/29/2022   11:22 AM 09/01/2022   11:42 AM 02/25/2022    9:51 AM  CMP  Glucose 70 - 99 mg/dL 91  84  409   BUN 6 - 24 mg/dL 11  8  8    Creatinine 0.57 - 1.00 mg/dL 8.11  9.14  7.82   Sodium 134 - 144 mmol/L 138  140  139   Potassium 3.5 - 5.2 mmol/L 4.0  4.0  3.8   Chloride 96 - 106 mmol/L 102  103  102   CO2 20 - 29 mmol/L 21  20  22    Calcium 8.7 - 10.2 mg/dL 9.1  8.6  9.3   Total Protein 6.0 - 8.5 g/dL   7.6   Total Bilirubin 0.0 - 1.2 mg/dL   0.2   Alkaline Phos 44 - 121 IU/L   70   AST 0 - 40 IU/L   16   ALT 0 - 32 IU/L   15     Lipid Panel     Component Value Date/Time   CHOL 199 02/25/2022 0951   TRIG 45 02/25/2022 0951   HDL 68 02/25/2022 0951   CHOLHDL 3.1 08/27/2021 1123   CHOLHDL 3.1 03/05/2016 1018   VLDL 9 03/05/2016 1018   LDLCALC 123 (H)  02/25/2022 0951    CBC    Component Value Date/Time   WBC 8.0 03/02/2020 1541   RBC 4.61 03/02/2020 1541   HGB 12.2 03/02/2020 1541   HGB 11.4 03/25/2017 1127   HCT 38.7 03/02/2020 1541   HCT 35.0  03/25/2017 1127   PLT 316 03/02/2020 1541   PLT 297 03/25/2017 1127   MCV 83.9 03/02/2020 1541   MCV 82 03/25/2017 1127   MCH 26.5 03/02/2020 1541   MCHC 31.5 03/02/2020 1541   RDW 14.4 03/02/2020 1541   RDW 15.6 (H) 03/25/2017 1127   LYMPHSABS 2.9 03/02/2020 1541   LYMPHSABS 2.3 03/25/2017 1127   MONOABS 0.7 03/02/2020 1541   EOSABS 0.3 03/02/2020 1541   EOSABS 0.2 03/25/2017 1127   BASOSABS 0.1 03/02/2020 1541   BASOSABS 0.0 03/25/2017 1127    Lab Results  Component Value Date   HGBA1C 5.9 (A) 09/01/2022    Assessment & Plan:  1. Tenosynovitis, de Quervain Wrist brace will be beneficial - Ambulatory referral to Orthopedics - predniSONE (DELTASONE) 20 MG tablet; Take 1 tablet (20 mg total) by mouth daily with breakfast.  Dispense: 5 tablet; Refill: 0  2. Screening for colon cancer She declines colonoscopy - Cologuard  3. Plantar fasciitis of right foot Advised to use insoles - predniSONE (DELTASONE) 20 MG tablet; Take 1 tablet (20 mg total) by mouth daily with breakfast.  Dispense: 5 tablet; Refill: 0  4. Essential hypertension Initial blood pressure was elevated, repeat is normal She is yet to take her antihypertensive due to the fact that she is fasting Counseled on blood pressure goal of less than 130/80, low-sodium, DASH diet, medication compliance, 150 minutes of moderate intensity exercise per week. Discussed medication compliance, adverse effects. - Basic Metabolic Panel - amLODipine (NORVASC) 10 MG tablet; Take 1 tablet (10 mg total) by mouth daily.  Dispense: 90 tablet; Refill: 1 - losartan (COZAAR) 50 MG tablet; Take 1 tablet (50 mg total) by mouth daily.  Dispense: 90 tablet; Refill: 1   Meds ordered this encounter  Medications   predniSONE (DELTASONE) 20 MG tablet    Sig: Take 1 tablet (20 mg total) by mouth daily with breakfast.    Dispense:  5 tablet    Refill:  0   amLODipine (NORVASC) 10 MG tablet    Sig: Take 1 tablet (10 mg total) by mouth  daily.    Dispense:  90 tablet    Refill:  1   losartan (COZAAR) 50 MG tablet    Sig: Take 1 tablet (50 mg total) by mouth daily.    Dispense:  90 tablet    Refill:  1    Dose increase    Follow-up: Return in about 6 months (around 10/20/2023) for Chronic medical conditions.       Hoy Register, MD, FAAFP. [facility] [place], [place] [phone]   04/19/2023, 2:41 PM

## 2023-04-19 NOTE — Patient Instructions (Signed)
De Quervain's Tenosynovitis  De Quervain's tenosynovitis is a condition that causes inflammation of the tendon on the thumb side of the wrist. Tendons are cords of tissue that connect bones to muscles. The tendons in the hand pass through a tunnel called a sheath. A slippery layer of tissue (synovium) lets the tendons move smoothly in the sheath. With de Quervain's tenosynovitis, the sheath swells or thickens, causing friction and pain. The condition is also called de Quervain's disease and de Quervain's syndrome. It occurs most often in women who are 30-50 years old. What are the causes? The exact cause of this condition is not known. It may be associated with overuse of the hand and wrist. What increases the risk? You are more likely to develop this condition if you: Use your hands far more than normal, especially if you repeat certain movements that involve twisting your hand or using a tight grip. Are pregnant. Are a middle-aged woman. Have rheumatoid arthritis. Have diabetes. What are the signs or symptoms? The main symptom of this condition is pain on the thumb side of the wrist. The pain may get worse when you grasp something or turn your wrist. Other symptoms may include: Pain that extends up the forearm. Swelling of your wrist and hand. Trouble moving the thumb and wrist. A sensation of snapping in the wrist. A bump filled with fluid (cyst) in the area of the pain. How is this diagnosed? This condition may be diagnosed based on: Your symptoms and medical history. A physical exam. During the exam, your health care provider may do a simple test (Finkelstein test) that involves pulling your thumb and wrist to see if this causes pain. You may also need to have an X-ray or ultrasound. How is this treated? Treatment for this condition may include: Avoiding any activity that causes pain and swelling. Taking medicines. Anti-inflammatory medicines and corticosteroid injections may be used  to reduce inflammation and relieve pain. Wearing a splint. Having surgery. This may be needed if other treatments do not work. Once the pain and swelling have gone down, you may start: Physical therapy. This includes exercises to improve movement and strength in your wrist and thumb. Occupational therapy. This includes adjusting how you move your wrist. Follow these instructions at home: If you have a splint: Wear the splint as told by your health care provider. Remove it only as told by your health care provider. Loosen the splint if your fingers tingle, become numb, or turn cold and blue. Keep the splint clean. If the splint is not waterproof: Do not let it get wet. Cover it with a watertight covering when you take a bath or a shower. Managing pain, stiffness, and swelling  Avoid movements and activities that cause pain and swelling in the wrist area. If directed, put ice on the painful area. This may be helpful after doing activities that involve the sore wrist. To do this: Put ice in a plastic bag. Place a towel between your skin and the bag. Leave the ice on for 20 minutes, 2-3 times a day. Remove the ice if your skin turns bright red. This is very important. If you cannot feel pain, heat, or cold, you have a greater risk of damage to the area. Move your fingers often to reduce stiffness and swelling. Raise (elevate) the injured area above the level of your heart while you are sitting or lying down. General instructions Return to your normal activities as told by your health care provider. Ask your   health care provider what activities are safe for you. Take over-the-counter and prescription medicines only as told by your health care provider. Keep all follow-up visits. This is important. Contact a health care provider if: Your pain medicine does not help. Your pain gets worse. You develop new symptoms. Summary De Quervain's tenosynovitis is a condition that causes inflammation  of the tendon on the thumb side of the wrist. The condition occurs most often in women who are 30-50 years old. The exact cause of this condition is not known. It may be associated with overuse of the hand and wrist. Treatment starts with avoiding activity that causes pain or swelling in the wrist area. Other treatments may include wearing a splint and taking medicine. Sometimes, surgery is needed. This information is not intended to replace advice given to you by your health care provider. Make sure you discuss any questions you have with your health care provider. Document Revised: 02/28/2020 Document Reviewed: 02/28/2020 Elsevier Patient Education  2023 [employer]vier Inc.  

## 2023-04-19 NOTE — Progress Notes (Signed)
Knot on right wrist

## 2023-04-20 LAB — BASIC METABOLIC PANEL
BUN/Creatinine Ratio: 23 (ref 9–23)
BUN: 13 mg/dL (ref 6–24)
CO2: 21 mmol/L (ref 20–29)
Calcium: 9.1 mg/dL (ref 8.7–10.2)
Chloride: 105 mmol/L (ref 96–106)
Creatinine, Ser: 0.56 mg/dL — ABNORMAL LOW (ref 0.57–1.00)
Glucose: 83 mg/dL (ref 70–99)
Potassium: 3.9 mmol/L (ref 3.5–5.2)
Sodium: 139 mmol/L (ref 134–144)
eGFR: 115 mL/min/{1.73_m2} (ref >59)

## 2023-05-07 ENCOUNTER — Other Ambulatory Visit: Payer: Self-pay

## 2023-08-22 ENCOUNTER — Other Ambulatory Visit: Payer: Self-pay

## 2023-08-22 ENCOUNTER — Encounter (HOSPITAL_COMMUNITY): Payer: Self-pay

## 2023-08-22 ENCOUNTER — Emergency Department (HOSPITAL_COMMUNITY)
Admission: EM | Admit: 2023-08-22 | Discharge: 2023-08-22 | Disposition: A | Discharge status: Home / Self Care | Payer: BLUE CROSS/BLUE SHIELD | Attending: Emergency Medicine | Admitting: Emergency Medicine

## 2023-08-22 DIAGNOSIS — Z79899 Other long term (current) drug therapy: Secondary | ICD-10-CM | POA: Diagnosis not present

## 2023-08-22 DIAGNOSIS — R519 Headache, unspecified: Secondary | ICD-10-CM

## 2023-08-22 DIAGNOSIS — I10 Essential (primary) hypertension: Secondary | ICD-10-CM | POA: Insufficient documentation

## 2023-08-22 LAB — CBC WITH DIFFERENTIAL/PLATELET
Abs Immature Granulocytes: 0.04 10*3/uL (ref 0.00–0.07)
Basophils Absolute: 0 10*3/uL (ref 0.0–0.1)
Basophils Relative: 1 %
Eosinophils Absolute: 0.2 10*3/uL (ref 0.0–0.5)
Eosinophils Relative: 2 %
HCT: 36.7 % (ref 36.0–46.0)
Hemoglobin: 11.5 g/dL — ABNORMAL LOW (ref 12.0–15.0)
Immature Granulocytes: 1 %
Lymphocytes Relative: 39 %
Lymphs Abs: 3.4 10*3/uL (ref 0.7–4.0)
MCH: 26.7 pg (ref 26.0–34.0)
MCHC: 31.3 g/dL (ref 30.0–36.0)
MCV: 85.2 fL (ref 80.0–100.0)
Monocytes Absolute: 0.7 10*3/uL (ref 0.1–1.0)
Monocytes Relative: 8 %
Neutro Abs: 4.4 10*3/uL (ref 1.7–7.7)
Neutrophils Relative %: 49 %
Platelets: 309 10*3/uL (ref 150–400)
RBC: 4.31 MIL/uL (ref 3.87–5.11)
RDW: 14.3 % (ref 11.5–15.5)
WBC: 8.8 10*3/uL (ref 4.0–10.5)
nRBC: 0 % (ref 0.0–0.2)

## 2023-08-22 LAB — COMPREHENSIVE METABOLIC PANEL
ALT: 12 U/L (ref 0–44)
AST: 14 U/L — ABNORMAL LOW (ref 15–41)
Albumin: 3.8 g/dL (ref 3.5–5.0)
Alkaline Phosphatase: 42 U/L (ref 38–126)
Anion gap: 6 (ref 5–15)
BUN: 14 mg/dL (ref 6–20)
CO2: 23 mmol/L (ref 22–32)
Calcium: 8.7 mg/dL — ABNORMAL LOW (ref 8.9–10.3)
Chloride: 108 mmol/L (ref 98–111)
Creatinine, Ser: 0.7 mg/dL (ref 0.44–1.00)
GFR, Estimated: >60 mL/min (ref >60)
Glucose, Bld: 108 mg/dL — ABNORMAL HIGH (ref 70–99)
Potassium: 3.6 mmol/L (ref 3.5–5.1)
Sodium: 137 mmol/L (ref 135–145)
Total Bilirubin: 0.5 mg/dL (ref 0.3–1.2)
Total Protein: 7.6 g/dL (ref 6.5–8.1)

## 2023-08-22 MED ORDER — LOSARTAN POTASSIUM 50 MG PO TABS
50.0000 mg | ORAL_TABLET | Freq: Every day | ORAL | 1 refills | Status: DC
  Filled 2023-08-22: qty 30, 30d supply, fill #0

## 2023-08-22 MED ORDER — AMLODIPINE BESYLATE 10 MG PO TABS
10.0000 mg | ORAL_TABLET | Freq: Every day | ORAL | 1 refills | Status: DC
  Filled 2023-08-22: qty 30, 30d supply, fill #0

## 2023-08-22 NOTE — ED Provider Notes (Signed)
[name] Provider Note   CSN: [id number] Arrival date & time: 08/22/23  1941     History  Chief Complaint  Patient presents with   Headache    Carolyn Ramsey is a 47 y.o. female.  47 year old female with a history of hypertension presents to the emergency department for complaints of headache.  She states that she has had a headache throughout the day.  She took Tylenol at 1500 with some improvement.  Currently she reports having minimal to no pain.  She has had similar headaches in the past associated with high blood pressure.  She ran out of her blood pressure medications yesterday.  She is requesting a refill.  She has not had any fever, nausea, vomiting, vision changes, extremity numbness or paresthesias, extremity weakness.  No history of head injury or trauma.  The history is provided by the patient and a relative. A language interpreter was used (Son utilized for translation; patient declined use of formal interpreter).  Headache      Home Medications Prior to Admission medications   Medication Sig Start Date End Date Taking? Authorizing Provider  amLODipine (NORVASC) 10 MG tablet Take 1 tablet (10 mg total) by mouth daily. 08/22/23   Antony Madura, PA-C  cyclobenzaprine (FLEXERIL) 10 MG tablet Take 1 tablet (10 mg total) by mouth at bedtime as needed for muscle spasms. 09/03/22   LampteyBritta Mccreedy, MD  diclofenac Sodium (VOLTAREN) 1 % GEL Apply 4 g topically 4 (four) times daily. 01/06/21   Hoy Register, MD  ibuprofen (ADVIL) 600 MG tablet Take 1 tablet (600 mg total) by mouth every 8 (eight) hours as needed for headache. 09/03/22   LampteyBritta Mccreedy, MD  levonorgestrel (LILETTA, 52 MG,) 18.6 MCG/DAY IUD IUD 1 Intra Uterine Device (1 each total) by Intrauterine route once. 02/09/17 04/21/23  Leftwich-Kirby, Wilmer Floor, CNM  losartan (COZAAR) 50 MG tablet Take 1 tablet (50 mg total) by mouth daily. 08/22/23   Antony Madura, PA-C   olopatadine (PATANOL) 0.1 % ophthalmic solution Place 1 drop into both eyes 2 (two) times daily. 02/25/22   Hoy Register, MD  omeprazole (PRILOSEC) 40 MG capsule Take 1 capsule (40 mg total) by mouth daily for 14 days. 12/26/21 01/09/22  Piontek, Denny Peon, MD  predniSONE (DELTASONE) 20 MG tablet Take 1 tablet (20 mg total) by mouth daily with breakfast. 04/19/23   Hoy Register, MD  SUMAtriptan (IMITREX) 50 MG tablet Take 1 tablet (50 mg total) by mouth once for 1 dose. May repeat in 2 hours if headache persists or recurs. 09/03/22   Lamptey, Britta Mccreedy, MD      Allergies    Patient has no known allergies.    Review of Systems   Review of Systems  Neurological:  Positive for headaches.  Ten systems reviewed and are negative for acute change, except as noted in the HPI.    Physical Exam Updated Vital Signs BP (!) 158/91 (BP Location: Left Arm)   Pulse 80   Temp 98.2 F (36.8 C) (Oral)   Resp 18   SpO2 100%   Physical Exam Vitals and nursing note reviewed.  Constitutional:      General: She is not in acute distress.    Appearance: She is well-developed. She is not diaphoretic.     Comments: Nontoxic-appearing, pleasant.  HENT:     Head: Normocephalic and atraumatic.  Eyes:     General: No scleral icterus.    Extraocular Movements:  Extraocular movements intact and EOM normal.     Conjunctiva/sclera: Conjunctivae normal.  Neck:     Comments: No nuchal rigidity or meningismus Cardiovascular:     Rate and Rhythm: Normal rate and regular rhythm.     Pulses: Normal pulses.  Pulmonary:     Effort: Pulmonary effort is normal. No respiratory distress.     Comments: Respirations even and unlabored Musculoskeletal:        General: Normal range of motion.     Cervical back: Normal range of motion.  Skin:    General: Skin is warm and dry.     Coloration: Skin is not pale.     Findings: No erythema or rash.  Neurological:     Mental Status: She is alert and oriented to person, place,  and time.     Cranial Nerves: No cranial nerve deficit.     Coordination: Coordination normal.     Comments: GCS 15. Speech is goal oriented. No focal deficits appreciated. Patient moves extremities without ataxia.  Psychiatric:        Mood and Affect: Mood and affect normal.        Behavior: Behavior normal.     ED Results / Procedures / Treatments   Labs (all labs ordered are listed, but only abnormal results are displayed) Labs Reviewed  CBC WITH DIFFERENTIAL/PLATELET - Abnormal; Notable for the following components:      Result Value   Hemoglobin 11.5 (*)    All other components within normal limits  COMPREHENSIVE METABOLIC PANEL - Abnormal; Notable for the following components:   Glucose, Bld 108 (*)    Calcium 8.7 (*)    AST 14 (*)    All other components within normal limits    EKG None  Radiology No results found.  Procedures Procedures    Medications Ordered in ED Medications - No data to display  ED Course/ Medical Decision Making/ A&P                                 Medical Decision Making Risk Prescription drug management.   This patient presents to the ED for concern of headache, this involves an extensive number of treatment options, and is a complaint that carries with it a high risk of complications and morbidity.  The differential diagnosis includes ICH vs migraine vs hypertensive urgency/emergency vs IIH vs tension headache   Co morbidities that complicate the patient evaluation  HTN Obesity   Additional history obtained:  Additional history obtained from son, at bedside   Lab Tests:  I Ordered, and personally interpreted labs.  The pertinent results include:  Hgb 11.5. Normal CMP.   Cardiac Monitoring:  The patient was maintained on a cardiac monitor.  I personally viewed and interpreted the cardiac monitored which showed an underlying rhythm of: NSR   Medicines ordered and prescription drug management:  I have reviewed the  patients home medicines and have made adjustments as needed   Test Considered:  CT  head   Problem List / ED Course:  Patient presents to the emergency department for evaluation of headache which began this morning.  Patient with no history of recent head injury or trauma.  No fever, nuchal rigidity, meningismus to suggest meningitis.  Neurologic exam today is nonfocal.   Has hx of similar headaches associated with uncontrolled HTN. Initially hypertensive on arrival, but BP improved to 115/81 during my assessment at bedside.  Patient currently asymptomatic. Out of her BP meds x 1 day. Will give 1 month refill pending PCP f/u. Declines additional medications in the ED today. I do not believe further emergent workup is indicated at this time.  Return precautions discussed and provided.  Patient discharged in stable condition with no unaddressed concerns.   Reevaluation:  After the interventions noted above, I reevaluated the patient and found that they have :improved   Social Determinants of Health:  Language barrier   Dispostion:  After consideration of the diagnostic results and the patients response to treatment, I feel that the patent would benefit from resuming her BP medications. Will need close PCP f/u for BP recheck and additional medication refills. Return precautions discussed and provided. Patient discharged in stable condition with no unaddressed concerns.          Final Clinical Impression(s) / ED Diagnoses Final diagnoses:  Bad headache  Essential hypertension    Rx / DC Orders ED Discharge Orders          Ordered    amLODipine (NORVASC) 10 MG tablet  Daily        08/22/23 2303    losartan (COZAAR) 50 MG tablet  Daily        08/22/23 2303              Antony Madura, PA-C 08/22/23 2317    Gilda Crease, MD 08/23/23 [phone]

## 2023-08-22 NOTE — Discharge Instructions (Signed)
Take your prescribed blood pressure medications. Follow up with your primary care doctor.

## 2023-08-22 NOTE — ED Triage Notes (Addendum)
Pt. Arrives POV c/o a headache. Pt. States that she took tylenol at about 3pm. She states it got better, but she is still in pain. She is also requesting a refill on her losartan and amlodipine. Pt. Denies n/v and denies vision changes.

## 2023-08-23 ENCOUNTER — Other Ambulatory Visit: Payer: Self-pay

## 2023-08-23 ENCOUNTER — Other Ambulatory Visit (HOSPITAL_COMMUNITY): Payer: Self-pay

## 2023-09-06 ENCOUNTER — Other Ambulatory Visit (HOSPITAL_COMMUNITY): Payer: Self-pay

## 2023-09-06 ENCOUNTER — Other Ambulatory Visit: Payer: Self-pay

## 2023-09-06 ENCOUNTER — Ambulatory Visit: Payer: BLUE CROSS/BLUE SHIELD | Attending: Family Medicine | Admitting: Family Medicine

## 2023-09-06 ENCOUNTER — Encounter: Payer: Self-pay | Admitting: Family Medicine

## 2023-09-06 VITALS — BP 127/86 | HR 87 | Ht 67.0 in | Wt 238.0 lb

## 2023-09-06 DIAGNOSIS — M25531 Pain in right wrist: Secondary | ICD-10-CM

## 2023-09-06 DIAGNOSIS — Z1211 Encounter for screening for malignant neoplasm of colon: Secondary | ICD-10-CM

## 2023-09-06 DIAGNOSIS — Z23 Encounter for immunization: Secondary | ICD-10-CM

## 2023-09-06 DIAGNOSIS — I10 Essential (primary) hypertension: Secondary | ICD-10-CM | POA: Diagnosis not present

## 2023-09-06 DIAGNOSIS — L219 Seborrheic dermatitis, unspecified: Secondary | ICD-10-CM

## 2023-09-06 MED ORDER — AMLODIPINE BESYLATE 10 MG PO TABS
10.0000 mg | ORAL_TABLET | Freq: Every day | ORAL | 1 refills | Status: DC
  Filled 2023-09-06 – 2023-09-21 (×2): qty 90, 90d supply, fill #0
  Filled 2024-01-03: qty 90, 90d supply, fill #1

## 2023-09-06 MED ORDER — AMLODIPINE BESYLATE 10 MG PO TABS
10.0000 mg | ORAL_TABLET | Freq: Every day | ORAL | 1 refills | Status: DC
  Filled 2023-09-06: qty 90, 90d supply, fill #0

## 2023-09-06 MED ORDER — KETOCONAZOLE 2 % EX SHAM
1.0000 | MEDICATED_SHAMPOO | CUTANEOUS | 1 refills | Status: AC
  Filled 2023-09-06: qty 120, 84d supply, fill #0

## 2023-09-06 MED ORDER — LOSARTAN POTASSIUM 50 MG PO TABS
50.0000 mg | ORAL_TABLET | Freq: Every day | ORAL | 1 refills | Status: DC
  Filled 2023-09-06 – 2023-09-21 (×2): qty 90, 90d supply, fill #0
  Filled 2024-01-03: qty 90, 90d supply, fill #1

## 2023-09-06 NOTE — Progress Notes (Signed)
Subjective:  Patient ID: Carolyn Ramsey, female    DOB: 1976-08-03  Age: 47 y.o. MRN: [id number]  CC: Headache (Right wrist pain/)   HPI Carolyn Ramsey is a 47 y.o. year old female with a history of hypertension, and chronic headaches.   Interval History: Discussed the use of AI scribe software for clinical note transcription with the patient, who gave verbal consent to proceed.  She presents for follow-up after a recent emergency room visit due to a severe headache. The headache has since resolved and the patient attributes it to missing two days of her blood pressure medication.  The patient also reports a history of right wrist pain, which has been ongoing for approximately 8 months to a year. The pain is described as a 'vein pain' that radiates from the wrist to the upper right arm, rating it as 10/10 when working and 6-7/10 at rest.  Additionally, the patient reports a scalp itch, which she initially described as a rash. She denies any flaking or whitish discharge from the scalp when scratching.        Past Medical History:  Diagnosis Date   Hypertension    Spinal headache     Past Surgical History:  Procedure Laterality Date   NO PAST SURGERIES      Family History  Problem Relation Age of Onset   Healthy Father    Healthy Mother     Social History   Socioeconomic History   Marital status: Married    Spouse name: Not on file   Number of children: Not on file   Years of education: Not on file   Highest education level: Not on file  Occupational History   Not on file  Tobacco Use   Smoking status: Never   Smokeless tobacco: Never  Vaping Use   Vaping status: Never Used  Substance and Sexual Activity   Alcohol use: No    Alcohol/week: 0.0 standard drinks of alcohol   Drug use: No   Sexual activity: Yes    Birth control/protection: I.U.D.  Other Topics Concern   Not on file  Social History Narrative   Lives with husband and 4 children.  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    Social Determinants of Health   Financial Resource Strain: Not on file  Food Insecurity: Not on file  Transportation Needs: Not on file  Physical Activity: Not on file  Stress: Not on file  Social Connections: Not on file    No Known Allergies  Outpatient Medications Prior to Visit  Medication Sig Dispense Refill   amLODipine (NORVASC) 10 MG tablet Take 1 tablet (10 mg total) by mouth daily. 30 tablet 1   losartan (COZAAR) 50 MG tablet Take 1 tablet (50 mg total) by mouth daily. 30 tablet 1   cyclobenzaprine (FLEXERIL) 10 MG tablet Take 1 tablet (10 mg total) by mouth at bedtime as needed for muscle spasms. (Patient not taking: Reported on 09/06/2023) 10 tablet 0   diclofenac Sodium (VOLTAREN) 1 % GEL Apply 4 g topically 4 (four) times daily. (Patient not taking: Reported on 09/06/2023) 100 g g   ibuprofen (ADVIL) 600 MG tablet Take 1 tablet (600 mg total) by mouth every 8 (eight) hours as needed for headache. (Patient not taking: Reported on 09/06/2023) 30 tablet 0   levonorgestrel (LILETTA, 52 MG,) 18.6 MCG/DAY IUD IUD 1 Intra Uterine Device (1 each total) by Intrauterine route once. 1 each 0   olopatadine (  PATANOL) 0.1 % ophthalmic solution Place 1 drop into both eyes 2 (two) times daily. (Patient not taking: Reported on 09/06/2023) 5 mL 1   omeprazole (PRILOSEC) 40 MG capsule Take 1 capsule (40 mg total) by mouth daily for 14 days. 14 capsule 0   predniSONE (DELTASONE) 20 MG tablet Take 1 tablet (20 mg total) by mouth daily with breakfast. (Patient not taking: Reported on 09/06/2023) 5 tablet 0   SUMAtriptan (IMITREX) 50 MG tablet Take 1 tablet (50 mg total) by mouth once for 1 dose. May repeat in 2 hours if headache persists or recurs. (Patient not taking: Reported on 09/06/2023) 10 tablet 0   No facility-administered medications prior to visit.     ROS Review of Systems  Constitutional:  Negative for activity change and appetite  change.  HENT:  Negative for sinus pressure and sore throat.   Respiratory:  Negative for chest tightness, shortness of breath and wheezing.   Cardiovascular:  Negative for chest pain and palpitations.  Gastrointestinal:  Negative for abdominal distention, abdominal pain and constipation.  Genitourinary: Negative.   Musculoskeletal:        See HPI  Psychiatric/Behavioral:  Negative for behavioral problems and dysphoric mood.     Objective:  BP 127/86   Pulse 87   Ht 5\' 7"  (1.702 m)   Wt 238 lb (108 kg)   SpO2 98%   BMI 37.28 kg/m      09/06/2023    4:02 PM 09/06/2023    3:33 PM 08/22/2023    8:01 PM  BP/Weight  Systolic BP 127 143 158  Diastolic BP 86 92 91  Wt. (Lbs)  238   BMI  37.28 kg/m2       Physical Exam Constitutional:      Appearance: She is well-developed.  Cardiovascular:     Rate and Rhythm: Normal rate.     Heart sounds: Normal heart sounds. No murmur heard. Pulmonary:     Effort: Pulmonary effort is normal.     Breath sounds: Normal breath sounds. No wheezing or rales.  Chest:     Chest wall: No tenderness.  Abdominal:     General: Bowel sounds are normal. There is no distension.     Palpations: Abdomen is soft. There is no mass.     Tenderness: There is no abdominal tenderness.  Musculoskeletal:     Right lower leg: No edema.     Left lower leg: No edema.     Comments: Tenderness to palpation of midpoint of dorsum of right wrist joint  Skin:    Comments: Normal scalp exam  Neurological:     Mental Status: She is alert and oriented to person, place, and time.  Psychiatric:        Mood and Affect: Mood normal.        Latest Ref Rng & Units 08/22/2023    8:09 PM 04/19/2023    3:09 PM 10/29/2022   11:22 AM  CMP  Glucose 70 - 99 mg/dL 161  83  91   BUN 6 - 20 mg/dL 14  13  11    Creatinine 0.44 - 1.00 mg/dL 0.96  0.45  4.09   Sodium 135 - 145 mmol/L 137  139  138   Potassium 3.5 - 5.1 mmol/L 3.6  3.9  4.0   Chloride 98 - 111 mmol/L 108  105   102   CO2 22 - 32 mmol/L 23  21  21    Calcium 8.9 - 10.3 mg/dL  8.7  9.1  9.1   Total Protein 6.5 - 8.1 g/dL 7.6     Total Bilirubin 0.3 - 1.2 mg/dL 0.5     Alkaline Phos 38 - 126 U/L 42     AST 15 - 41 U/L 14     ALT 0 - 44 U/L 12       Lipid Panel     Component Value Date/Time   CHOL 199 02/25/2022 0951   TRIG 45 02/25/2022 0951   HDL 68 02/25/2022 0951   CHOLHDL 3.1 08/27/2021 1123   CHOLHDL 3.1 03/05/2016 1018   VLDL 9 03/05/2016 1018   LDLCALC 123 (H) 02/25/2022 0951    CBC    Component Value Date/Time   WBC 8.8 08/22/2023 2009   RBC 4.31 08/22/2023 2009   HGB 11.5 (L) 08/22/2023 2009   HGB 11.4 03/25/2017 1127   HCT 36.7 08/22/2023 2009   HCT 35.0 03/25/2017 1127   PLT 309 08/22/2023 2009   PLT 297 03/25/2017 1127   MCV 85.2 08/22/2023 2009   MCV 82 03/25/2017 1127   MCH 26.7 08/22/2023 2009   MCHC 31.3 08/22/2023 2009   RDW 14.3 08/22/2023 2009   RDW 15.6 (H) 03/25/2017 1127   LYMPHSABS 3.4 08/22/2023 2009   LYMPHSABS 2.3 03/25/2017 1127   MONOABS 0.7 08/22/2023 2009   EOSABS 0.2 08/22/2023 2009   EOSABS 0.2 03/25/2017 1127   BASOSABS 0.0 08/22/2023 2009   BASOSABS 0.0 03/25/2017 1127    Lab Results  Component Value Date   HGBA1C 5.9 (A) 09/01/2022    Assessment & Plan:      Hypertension Recent episode of headache due to missed antihypertensive medication, currently resolved. Blood pressure slightly elevated at the visit but normalized on repeat measurement. -Continue current antihypertensive medication. -Counseled on blood pressure goal of less than 130/80, low-sodium, DASH diet, medication compliance, 150 minutes of moderate intensity exercise per week. Discussed medication compliance, adverse effects.   Right Wrist Pain Chronic pain, possibly arthritis, with recent exacerbation. Pain rated as 10/10 during activity and 6-7/10 at rest. -Order wrist X-ray to evaluate for arthritis. -Prescribe Voltaren gel for topical pain relief.  Scalp  Itching/seborrheic dermatitis Possibly seborrheic dermatitis No visible rash or dandruff, no flaking. -Prescribe medicated shampoo for use twice weekly.  Colon Cancer Screening Due for screening, patient declined colonoscopy. -Send home stool test kit for non-invasive screening.  Follow-up in 6 months or sooner if any additional concerns arise.          Meds ordered this encounter  Medications   DISCONTD: amLODipine (NORVASC) 10 MG tablet    Sig: Take 1 tablet (10 mg total) by mouth daily.    Dispense:  90 tablet    Refill:  1   ketoconazole (NIZORAL) 2 % shampoo    Sig: Apply 1 Application topically 2 (two) times a week.    Dispense:  120 mL    Refill:  1   losartan (COZAAR) 50 MG tablet    Sig: Take 1 tablet (50 mg total) by mouth daily.    Dispense:  90 tablet    Refill:  1   amLODipine (NORVASC) 10 MG tablet    Sig: Take 1 tablet (10 mg total) by mouth daily.    Dispense:  90 tablet    Refill:  1    Follow-up: Return in about 6 months (around 03/06/2024) for Chronic medical conditions.       Hoy Register, MD, FAAFP. [facility]  [place], [place] [phone]   09/06/2023, 4:17 PM

## 2023-09-06 NOTE — Patient Instructions (Signed)
[facility] [address] [place], [place] [address] [phone]

## 2023-09-21 ENCOUNTER — Other Ambulatory Visit: Payer: Self-pay

## 2024-01-03 ENCOUNTER — Telehealth: Payer: Self-pay

## 2024-01-03 ENCOUNTER — Other Ambulatory Visit: Payer: Self-pay

## 2024-01-03 NOTE — Telephone Encounter (Signed)
Patient came in requesting refills on LOSARTAN 50 MG and AMLODIPINE 10 MG

## 2024-01-04 ENCOUNTER — Other Ambulatory Visit: Payer: Self-pay

## 2024-01-04 ENCOUNTER — Other Ambulatory Visit (HOSPITAL_COMMUNITY): Payer: Self-pay

## 2024-01-04 DIAGNOSIS — I10 Essential (primary) hypertension: Secondary | ICD-10-CM

## 2024-01-04 MED ORDER — AMLODIPINE BESYLATE 10 MG PO TABS
10.0000 mg | ORAL_TABLET | Freq: Every day | ORAL | 1 refills | Status: DC
  Filled 2024-01-04 – 2024-07-07 (×3): qty 90, 90d supply, fill #0

## 2024-01-04 MED ORDER — LOSARTAN POTASSIUM 50 MG PO TABS
50.0000 mg | ORAL_TABLET | Freq: Every day | ORAL | 1 refills | Status: DC
  Filled 2024-01-04 – 2024-07-07 (×3): qty 90, 90d supply, fill #0

## 2024-01-04 NOTE — Telephone Encounter (Signed)
 Medication has been refilled.

## 2024-01-12 ENCOUNTER — Ambulatory Visit: Payer: BLUE CROSS/BLUE SHIELD | Admitting: Family Medicine

## 2024-02-29 ENCOUNTER — Ambulatory Visit: Payer: BLUE CROSS/BLUE SHIELD | Attending: Family Medicine | Admitting: Family Medicine

## 2024-03-06 ENCOUNTER — Ambulatory Visit: Payer: BLUE CROSS/BLUE SHIELD | Attending: Family Medicine | Admitting: Family Medicine

## 2024-04-06 ENCOUNTER — Other Ambulatory Visit: Payer: Self-pay

## 2024-06-06 ENCOUNTER — Encounter (HOSPITAL_COMMUNITY): Payer: Self-pay

## 2024-06-06 ENCOUNTER — Emergency Department (HOSPITAL_COMMUNITY)
Admission: EM | Admit: 2024-06-06 | Discharge: 2024-06-07 | Disposition: A | Discharge status: Home / Self Care | Payer: Self-pay | Attending: Emergency Medicine | Admitting: Emergency Medicine

## 2024-06-06 ENCOUNTER — Other Ambulatory Visit: Payer: Self-pay

## 2024-06-06 DIAGNOSIS — R0602 Shortness of breath: Secondary | ICD-10-CM | POA: Insufficient documentation

## 2024-06-06 DIAGNOSIS — Z79899 Other long term (current) drug therapy: Secondary | ICD-10-CM | POA: Insufficient documentation

## 2024-06-06 DIAGNOSIS — R0789 Other chest pain: Secondary | ICD-10-CM | POA: Insufficient documentation

## 2024-06-06 DIAGNOSIS — I10 Essential (primary) hypertension: Secondary | ICD-10-CM | POA: Insufficient documentation

## 2024-06-06 DIAGNOSIS — R079 Chest pain, unspecified: Secondary | ICD-10-CM

## 2024-06-06 DIAGNOSIS — R600 Localized edema: Secondary | ICD-10-CM | POA: Insufficient documentation

## 2024-06-06 NOTE — ED Triage Notes (Signed)
 Pt complaining of pain in the center of chest that started a couple of nights ago. Now it is in the anterior part of her neck, shoulder and through to the back. She also said the yesterday both of her ankles are swelling.

## 2024-06-07 ENCOUNTER — Encounter (HOSPITAL_COMMUNITY): Payer: Self-pay

## 2024-06-07 ENCOUNTER — Other Ambulatory Visit (HOSPITAL_COMMUNITY): Payer: Self-pay

## 2024-06-07 ENCOUNTER — Emergency Department (HOSPITAL_COMMUNITY): Payer: Self-pay

## 2024-06-07 LAB — CBC
HCT: 37.1 % (ref 36.0–46.0)
Hemoglobin: 11.7 g/dL — ABNORMAL LOW (ref 12.0–15.0)
MCH: 26.6 pg (ref 26.0–34.0)
MCHC: 31.5 g/dL (ref 30.0–36.0)
MCV: 84.3 fL (ref 80.0–100.0)
Platelets: 288 K/uL (ref 150–400)
RBC: 4.4 MIL/uL (ref 3.87–5.11)
RDW: 15 % (ref 11.5–15.5)
WBC: 9.9 K/uL (ref 4.0–10.5)
nRBC: 0 % (ref 0.0–0.2)

## 2024-06-07 LAB — BRAIN NATRIURETIC PEPTIDE: B Natriuretic Peptide: 52.1 pg/mL (ref 0.0–100.0)

## 2024-06-07 LAB — BASIC METABOLIC PANEL WITH GFR
Anion gap: 10 (ref 5–15)
BUN: 14 mg/dL (ref 6–20)
CO2: 21 mmol/L — ABNORMAL LOW (ref 22–32)
Calcium: 9.1 mg/dL (ref 8.9–10.3)
Chloride: 107 mmol/L (ref 98–111)
Creatinine, Ser: <0.3 mg/dL — ABNORMAL LOW (ref 0.44–1.00)
Glucose, Bld: 102 mg/dL — ABNORMAL HIGH (ref 70–99)
Potassium: 3.7 mmol/L (ref 3.5–5.1)
Sodium: 138 mmol/L (ref 135–145)

## 2024-06-07 LAB — HCG, SERUM, QUALITATIVE: Preg, Serum: NEGATIVE

## 2024-06-07 LAB — TROPONIN I (HIGH SENSITIVITY)
Troponin I (High Sensitivity): <2 ng/L (ref <18)
Troponin I (High Sensitivity): <2 ng/L (ref <18)

## 2024-06-07 LAB — D-DIMER, QUANTITATIVE: D-Dimer, Quant: 1 ug{FEU}/mL — ABNORMAL HIGH (ref 0.00–0.50)

## 2024-06-07 MED ORDER — IOHEXOL 350 MG/ML SOLN
100.0000 mL | Freq: Once | INTRAVENOUS | Status: AC | PRN
  Administered 2024-06-07: 100 mL via INTRAVENOUS

## 2024-06-07 MED ORDER — HYDROCODONE-ACETAMINOPHEN 5-325 MG PO TABS
1.0000 | ORAL_TABLET | Freq: Once | ORAL | Status: AC
  Administered 2024-06-07: 1 via ORAL
  Filled 2024-06-07 (×2): qty 1

## 2024-06-07 MED ORDER — LIDOCAINE 5 % EX PTCH
1.0000 | MEDICATED_PATCH | CUTANEOUS | 0 refills | Status: AC
  Filled 2024-06-07: qty 30, 30d supply, fill #0

## 2024-06-07 MED ORDER — KETOROLAC TROMETHAMINE 15 MG/ML IJ SOLN
15.0000 mg | Freq: Once | INTRAMUSCULAR | Status: AC
  Administered 2024-06-07: 15 mg via INTRAVENOUS
  Filled 2024-06-07: qty 1

## 2024-06-07 MED ORDER — METHOCARBAMOL 500 MG PO TABS
500.0000 mg | ORAL_TABLET | Freq: Once | ORAL | Status: AC
  Administered 2024-06-07: 500 mg via ORAL
  Filled 2024-06-07: qty 1

## 2024-06-07 MED ORDER — NAPROXEN 500 MG PO TABS
500.0000 mg | ORAL_TABLET | Freq: Two times a day (BID) | ORAL | 0 refills | Status: DC
  Filled 2024-06-07: qty 30, 15d supply, fill #0

## 2024-06-07 NOTE — Discharge Instructions (Addendum)
 For your lower leg swelling I would recommend compression stockings and elevation.  Try to limit sodium in diet.  Follow-up with primary care for recheck of symptoms.  For chest pain I would recommend alternating Tylenol  and Naproxen  as well as ice and heat.  You can also use lidocaine  patch over area of pain.  Return for new or worsening symptoms. Follow up with PCP for recheck. Please call to schedule appointment.

## 2024-06-07 NOTE — ED Notes (Signed)
 Patient transported to CT

## 2024-06-07 NOTE — ED Provider Notes (Signed)
 [place] EMERGENCY DEPARTMENT AT [facility] Provider Note   CSN: [id number] Arrival date & time: 06/06/24  2322     Patient presents with: Chest Pain   Carolyn Ramsey is a 48 y.o. female.  Patient with past medical history of hypertension and obesity who presents to emergency room with complaint of chest pain shortness of breath and bilateral lower extremity swelling.  Patient reports that this started yesterday.  Locates pain to left upper chest and radiates to left arm.  She has not noted any exertional chest pain or shortness of breath.  Denies any recent travel.  She is not on blood thinner.  No history of DVT or PE.  Offered interpreter.  Family member present prefers to interpret.    Chest Pain      Prior to Admission medications   Medication Sig Start Date End Date Taking? Authorizing Provider  amLODipine  (NORVASC ) 10 MG tablet Take 1 tablet (10 mg total) by mouth daily. 01/04/24   Newlin, Enobong, MD  cyclobenzaprine  (FLEXERIL ) 10 MG tablet Take 1 tablet (10 mg total) by mouth at bedtime as needed for muscle spasms. Patient not taking: Reported on 09/06/2023 09/03/22   Blaise Aleene KIDD, MD  diclofenac  Sodium (VOLTAREN ) 1 % GEL Apply 4 g topically 4 (four) times daily. Patient not taking: Reported on 09/06/2023 01/06/21   Newlin, Enobong, MD  ibuprofen  (ADVIL ) 600 MG tablet Take 1 tablet (600 mg total) by mouth every 8 (eight) hours as needed for headache. Patient not taking: Reported on 09/06/2023 09/03/22   Blaise Aleene KIDD, MD  ketoconazole  (NIZORAL ) 2 % shampoo Apply 1 Application topically 2 (two) times a week. 09/06/23   Newlin, Enobong, MD  levonorgestrel  (LILETTA , 52 MG,) 18.6 MCG/DAY IUD IUD 1 Intra Uterine Device (1 each total) by Intrauterine route once. 02/09/17 04/21/23  Leftwich-Kirby, Olam LABOR, CNM  losartan  (COZAAR ) 50 MG tablet Take 1 tablet (50 mg total) by mouth daily. 01/04/24   Newlin, Enobong, MD  olopatadine  (PATANOL) 0.1 % ophthalmic solution Place 1  drop into both eyes 2 (two) times daily. Patient not taking: Reported on 09/06/2023 02/25/22   Newlin, Enobong, MD  omeprazole  (PRILOSEC) 40 MG capsule Take 1 capsule (40 mg total) by mouth daily for 14 days. 12/26/21 01/09/22  Darral Longs, MD  predniSONE  (DELTASONE ) 20 MG tablet Take 1 tablet (20 mg total) by mouth daily with breakfast. Patient not taking: Reported on 09/06/2023 04/19/23   Newlin, Enobong, MD  SUMAtriptan  (IMITREX ) 50 MG tablet Take 1 tablet (50 mg total) by mouth once for 1 dose. May repeat in 2 hours if headache persists or recurs. Patient not taking: Reported on 09/06/2023 09/03/22   Blaise Aleene KIDD, MD    Allergies: Patient has no known allergies.    Review of Systems  Cardiovascular:  Positive for chest pain.    Updated Vital Signs BP 131/86   Pulse 90   Temp 97.9 F (36.6 C) (Oral)   Resp 18   Ht 5' 7 (1.702 m)   Wt 106.6 kg   SpO2 98%   BMI 36.81 kg/m   Physical Exam Vitals and nursing note reviewed.  Constitutional:      General: She is not in acute distress.    Appearance: She is not toxic-appearing.  HENT:     Head: Normocephalic and atraumatic.  Eyes:     General: No scleral icterus.    Conjunctiva/sclera: Conjunctivae normal.  Cardiovascular:     Rate and Rhythm: Normal rate and  regular rhythm.     Pulses: Normal pulses.     Heart sounds: Normal heart sounds.  Pulmonary:     Effort: Pulmonary effort is normal. No respiratory distress.     Breath sounds: Normal breath sounds.  Chest:     Chest wall: Tenderness present.  Abdominal:     General: Abdomen is flat. Bowel sounds are normal.     Palpations: Abdomen is soft.     Tenderness: There is no abdominal tenderness.  Musculoskeletal:     Right lower leg: Edema present.     Left lower leg: Edema present.     Comments: Lateral shoulder TTP.  Upper extremity strength and sensation intact.  Strong radial pulse equal bilaterally.  Skin:    General: Skin is warm and dry.     Findings: No  lesion.  Neurological:     General: No focal deficit present.     Mental Status: She is alert and oriented to person, place, and time. Mental status is at baseline.     (all labs ordered are listed, but only abnormal results are displayed) Labs Reviewed  BASIC METABOLIC PANEL WITH GFR - Abnormal; Notable for the following components:      Result Value   CO2 21 (*)    Glucose, Bld 102 (*)    Creatinine, Ser <0.30 (*)    All other components within normal limits  CBC - Abnormal; Notable for the following components:   Hemoglobin 11.7 (*)    All other components within normal limits  D-DIMER, QUANTITATIVE - Abnormal; Notable for the following components:   D-Dimer, Quant 1.00 (*)    All other components within normal limits  HCG, SERUM, QUALITATIVE  BRAIN NATRIURETIC PEPTIDE  TROPONIN I (HIGH SENSITIVITY)  TROPONIN I (HIGH SENSITIVITY)    EKG: EKG Interpretation Date/Time:  Tuesday June 06 2024 23:38:32 EDT Ventricular Rate:  91 PR Interval:  138 QRS Duration:  81 QT Interval:  342 QTC Calculation: 421 R Axis:   17  Text Interpretation: Sinus rhythm Consider left atrial enlargement Confirmed by Griselda Norris [id number]) on 06/07/2024 4:10:46 AM  Radiology: CT Angio Chest PE W and/or Wo Contrast Result Date: 06/07/2024 CLINICAL DATA:  Central chest pain radiates to neck and shoulder. Bilateral ankle swelling. EXAM: CT ANGIOGRAPHY CHEST WITH CONTRAST TECHNIQUE: Multidetector CT imaging of the chest was performed using the standard protocol during bolus administration of intravenous contrast. Multiplanar CT image reconstructions and MIPs were obtained to evaluate the vascular anatomy. RADIATION DOSE REDUCTION: This exam was performed according to the departmental dose-optimization program which includes automated exposure control, adjustment of the mA and/or kV according to patient size and/or use of iterative reconstruction technique. CONTRAST:  OMNIPAQUE  IOHEXOL  350 MG/ML SOLN  COMPARISON:  None Available. FINDINGS: Cardiovascular: The heart size is upper normal to borderline enlarged. No substantial pericardial effusion. There is no filling defect within the opacified pulmonary arteries to suggest the presence of an acute pulmonary embolus. Mediastinum/Nodes: No mediastinal lymphadenopathy. There is no hilar lymphadenopathy. The esophagus has normal imaging features. There is no axillary lymphadenopathy. Lungs/Pleura: Patchy ground-glass opacity in the dependent lungs may reflect atelectasis/air trapping. 5 mm left lower lobe nodule identified on 74/3. No dense consolidative opacity. No pleural effusion. Upper Abdomen: Visualized portion of the upper abdomen shows no acute findings. Musculoskeletal: No worrisome lytic or sclerotic osseous abnormality. Review of the MIP images confirms the above findings. IMPRESSION: 1. No CT evidence for acute pulmonary embolus. 2. Patchy ground-glass opacity in the  dependent lungs may reflect atelectasis/air trapping. 3. 5 mm left lower lobe pulmonary nodule. No follow-up needed if patient is low-risk.This recommendation follows the consensus statement: Guidelines for Management of Incidental Pulmonary Nodules Detected on CT Images: From the Fleischner Society 2017; Radiology 2017; 284:228-243. Electronically Signed   By: Camellia Candle M.D.   On: 06/07/2024 06:06   DG Chest 2 View Result Date: 06/07/2024 CLINICAL DATA:  Chest pain. Centralized chest pain for 2 days radiating to the left shoulder and arm. EXAM: CHEST - 2 VIEW COMPARISON:  None Available. FINDINGS: Normal heart size and pulmonary vascularity. No focal airspace disease or consolidation in the lungs. No blunting of costophrenic angles. No pneumothorax. Mediastinal contours appear intact. IMPRESSION: No active cardiopulmonary disease. Electronically Signed   By: Elsie Gravely M.D.   On: 06/07/2024 00:33     Procedures   Medications Ordered in the ED  HYDROcodone -acetaminophen   (NORCO/VICODIN) 5-325 MG per tablet 1 tablet (1 tablet Oral Given 06/07/24 0507)  iohexol  (OMNIPAQUE ) 350 MG/ML injection 100 mL (100 mLs Intravenous Contrast Given 06/07/24 0544)    Clinical Course as of 06/07/24 0549  Wed Jun 07, 2024  0441 Discussed labs and positive D-dimer.  Patient to get CTA chest to rule out PE.  [JB]    Clinical Course User Index [JB] Ashantia Amaral, Warren SAILOR, PA-C                                 Medical Decision Making Amount and/or Complexity of Data Reviewed Labs: ordered. Radiology: ordered.  Risk Prescription drug management.   This patient presents to the ED for concern of chest pain, this involves an extensive number of treatment options, and is a complaint that carries with it a high risk of complications and morbidity.  The differential diagnosis includes costochondritis, PE, ACS, pneumonia, pneumothorax   Co morbidities that complicate the patient evaluation  Hypertension, obesity    Lab Tests:  I personally interpreted labs.  The pertinent results include:   CBC without leukocytosis.  Hemoglobin is 11.7.  BMP without electrolyte abnormality.  Normal kidney function. Trop and delta wnl. BNP 52.  Dimer 1.00, PE study ordered.    Imaging Studies ordered:  I ordered imaging studies including Chest x-ray, PE study   I independently visualized and interpreted imaging which showed no acute findings.  I agree with the radiologist interpretation    Cardiac Monitoring: / EKG:  The patient was maintained on a cardiac monitor.  I personally viewed and interpreted the cardiac monitored which showed an underlying rhythm of: sinus    Problem List / ED Course / Critical interventions / Medication management  Patient reports to emergency room with complaint of chest pain and shortness of breath.  She has noted some very mild lower extremity swelling with this as well.  Chest x-ray without evidence of pneumonia or pneumothorax.  Symptoms seem less  consistent with ACS and she has had 2 negative troponins here.  No sign of congestion on chest x-ray and BNP within normal limits.  Her D-dimer is elevated at 1 thus ordered PE study to rule out pulmonary embolism.  Given Norco for pain control.  Currently hemodynamically stable and well-appearing.  CT PE study without acute finding.  No evidence of pulmonary embolism.  With overall reassuring workup feel patient is stable for discharge and outpatient follow-up.  Given symptom control and return precaution. I ordered medication including Norco for CP  Reevaluation of the patient after these medicines showed that the patient stayed the same I have reviewed the patients home medicines and have made adjustments as needed      Final diagnoses:  Chest pain, unspecified type  Lower extremity edema    ED Discharge Orders          Ordered    naproxen  (NAPROSYN ) 500 MG tablet  2 times daily        06/07/24 0612    lidocaine  (LIDODERM ) 5 %  Every 24 hours        06/07/24 0612               Ebin Palazzi, Warren SAILOR, PA-C 06/07/24 9385    Griselda Norris, MD 06/07/24 [phone]

## 2024-07-07 ENCOUNTER — Other Ambulatory Visit: Payer: Self-pay

## 2024-07-13 ENCOUNTER — Ambulatory Visit: Payer: Self-pay | Attending: Family Medicine | Admitting: Family Medicine

## 2024-07-13 ENCOUNTER — Encounter: Payer: Self-pay | Admitting: Family Medicine

## 2024-07-13 ENCOUNTER — Other Ambulatory Visit: Payer: Self-pay

## 2024-07-13 VITALS — BP 132/86 | HR 82 | Ht 67.0 in | Wt 232.4 lb

## 2024-07-13 DIAGNOSIS — Z1211 Encounter for screening for malignant neoplasm of colon: Secondary | ICD-10-CM

## 2024-07-13 DIAGNOSIS — M19012 Primary osteoarthritis, left shoulder: Secondary | ICD-10-CM

## 2024-07-13 DIAGNOSIS — I10 Essential (primary) hypertension: Secondary | ICD-10-CM

## 2024-07-13 DIAGNOSIS — M25562 Pain in left knee: Secondary | ICD-10-CM

## 2024-07-13 DIAGNOSIS — R7303 Prediabetes: Secondary | ICD-10-CM

## 2024-07-13 MED ORDER — LOSARTAN POTASSIUM 50 MG PO TABS
50.0000 mg | ORAL_TABLET | Freq: Every day | ORAL | 1 refills | Status: AC
  Filled 2024-07-13 – 2024-10-23 (×2): qty 90, 90d supply, fill #0

## 2024-07-13 MED ORDER — NAPROXEN 500 MG PO TABS
500.0000 mg | ORAL_TABLET | Freq: Two times a day (BID) | ORAL | 2 refills | Status: AC
  Filled 2024-07-13: qty 60, 30d supply, fill #0

## 2024-07-13 MED ORDER — AMLODIPINE BESYLATE 10 MG PO TABS
10.0000 mg | ORAL_TABLET | Freq: Every day | ORAL | 1 refills | Status: AC
  Filled 2024-07-13 – 2024-10-23 (×2): qty 90, 90d supply, fill #0

## 2024-07-13 NOTE — Patient Instructions (Signed)
??? ?????   Shoulder Pain ?? ????? ????? ???? ?? ???? ??? ?????? ??? ?? ???:  ????? ?? ?????.  ??? ??????? ?????.  ?????? ???????. ?? ???? ???? ?????:  ????????.  ????? ?? ???? ?????.  ????? ?? ????? ?? ?????? ?? ?????. ???? ????????? ??????? ?? ??????: ????? ????? ???????? ???? ???? ??? ??????? ???? ???????. ????? ???? ??????? ?????? ??????? ?? ????. ???? ????????? ??????? ?????? ?????. ??? ???? ????? ????? ???????? ?? ?? ????? ???? ????? ????:  ????? ??????? ??????? ??? ??????? ??????? ??????? ?????? ??????? ??? ??? ?????? ??? ??? ????? ???? ????? ??.  ???? ????? ?????? ???????? ??????? ????? ????? ??????? ?????? ??? ??? ????? ?????.   ??? ?? ????? ??????? ??? ???? ???? ?? ????? ?? ????? ?? ??????.  ???? ??? ????? ????? ?????.  ???? ?? ??? ??????? ?? ????? ??????? ?????: ? ?? ?????? ?????.  ? ????? ??? ????????? ?? ????????.  ???? ????? ???? ??? ????? ???? ???? ??? ???? ??? ???? ??????. ??????? ??? ????? ??????? ???????   ?? ????? ??? ??????? ???? ?????? ??? ????? ???? ??????? ?????? ????. ? ??? ??? ?????? ????? ??? ?? ?????? ??????? ?? ??? ???? ????? ?????? ??? ??????? ???? ??????? ??????. ? ??? ????? ?? ??? ???????. ? ?? ????? ??? ???? ??????. ? ???? ????? ???? 20 ?????? ???? ??? ????? 2-3 ???? ??????. ? ??? ???? ??? ????? ??? ????? ?????? ??????? ???? ????? ????? ??? ?? ????? ?????? ?? ?????. ?????? ??? ?????? ???? ???????? ??? ??? ?? ?????? ?????? ?????? ?? ??????? ?? ???????. ? ??? ?????? ???????? ?????? ?????? ???????.  ???? ??? ??? ???? ?? ????? ?? ????? ????? ??? ???? ?? ??????. ???? ????? ??? ??? ??????. ??? ????? ????? ??? ????? ??????. ??????? ????  ?? ?????? ???????? ????? ???? ????? ????? ???? ?? ???? ???? ????? ??? ????? ???????? ???? ??????? ?????? ??????? ??.  ???????? ???????? ?? ???? ?? ??????? ?? ?????? ??????? ??? ??? ??? ???? ??????? ?????? ???.  ?? ????? ???? ??????? ?????? ??? ??????? ???? ????? ???????? ?? ??????.  ???? ??? ?????? ???????? ????? ?? ??? ??  ??????? ??????? ?? ????? ?? ??? ????. ???? ?????? ??????? ?????? ?? ??????? ???????:  ?????? ???? ?? ???? ????????.  ???? ??? ???? ?? ?????? ?? ???? ?? ???????.  ?????? ????? ????? ????? ???????? ???? ????? ?? ??? ?? ?????? ?? ????? ???? ???? ?? ??? ?? ???. ???? ???????? ????? ?? ??????? ???????:  ???? ??? ????? ?? ??? ?? ?????? ??? ????? ?????? ?? ??????. ??? ????? ?? ??? ????????? ?? ???? ?????? ????????? ???? ?????? ???? ??????? ??????. ???? ?? ?????? ??? ????? ???? ?? ???? ?? ???? ??????? ??????.? Document Revised: 07/10/2022 Document Reviewed: 07/10/2022 Elsevier Patient Education  2024 [employer].

## 2024-07-13 NOTE — Progress Notes (Signed)
 Subjective:  Patient ID: Carolyn Ramsey, female    DOB: 12-05-1975  Age: 48 y.o. MRN: [id number]  CC: Medical Management of Chronic Issues (Left shoulder pain/Left knee pain)     Discussed the use of AI scribe software for clinical note transcription with the patient, who gave verbal consent to proceed.  History of Present Illness Carolyn Ramsey is a 49 year old female with prediabetes and hypertension who presents with left shoulder and knee pain.  She has experienced severe left shoulder pain for six months, initially seeking emergency care due to its intensity and associated chest pain.  CTA chest was performed, but the shoulder pain was not addressed. Pain occurs with arm lifting, especially when dressing, and is severe upon waking, limiting arm movement. There is no tingling or numbness in her hands.  Left knee pain began one week ago, worsening with walking and radiating to the left upper thigh and lower back. There is no swelling, numbness, or recent trauma to the knee.  Her medical history includes prediabetes and hypertension, managed with amlodipine and losartan. Blood pressure is affected by lack of sleep, and she did not sleep well the previous night. She currently has no headaches.    Past Medical History:  Diagnosis Date   Hypertension    Spinal headache     Past Surgical History:  Procedure Laterality Date   NO PAST SURGERIES      Family History  Problem Relation Age of Onset   Healthy Father    Healthy Mother     Social History   Socioeconomic History   Marital status: Married    Spouse name: Not on file   Number of children: Not on file   Years of education: Not on file   Highest education level: Not on file  Occupational History   Not on file  Tobacco Use   Smoking status: Never   Smokeless tobacco: Never  Vaping Use   Vaping status: Never Used  Substance and Sexual Activity   Alcohol use: No    Alcohol/week: 0.0 standard drinks of  alcohol   Drug use: No   Sexual activity: Yes    Birth control/protection: I.U.D.  Other Topics Concern   Not on file  Social History Narrative   Lives with husband and 4 children.  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    Social Drivers of Corporate investment banker Strain: Not on file  Food Insecurity: Not on file  Transportation Needs: Not on file  Physical Activity: Not on file  Stress: Not on file  Social Connections: Not on file    No Known Allergies  Outpatient Medications Prior to Visit  Medication Sig Dispense Refill   ketoconazole (NIZORAL) 2 % shampoo Apply 1 Application topically 2 (two) times a week. 120 mL 1   levonorgestrel (LILETTA, 52 MG,) 18.6 MCG/DAY IUD IUD 1 Intra Uterine Device (1 each total) by Intrauterine route once. 1 each 0   lidocaine (LIDODERM) 5 % Place 1 patch onto the skin daily. Remove & Discard patch within 12 hours or as directed by MD 30 patch 0   olopatadine (PATANOL) 0.1 % ophthalmic solution Place 1 drop into both eyes 2 (two) times daily. 5 mL 1   omeprazole (PRILOSEC) 40 MG capsule Take 1 capsule (40 mg total) by mouth daily for 14 days. 14 capsule 0   amLODipine (NORVASC) 10 MG tablet Take 1 tablet (10 mg total) by mouth daily. 90  tablet 1   cyclobenzaprine (FLEXERIL) 10 MG tablet Take 1 tablet (10 mg total) by mouth at bedtime as needed for muscle spasms. 10 tablet 0   diclofenac Sodium (VOLTAREN) 1 % GEL Apply 4 g topically 4 (four) times daily. 100 g g   ibuprofen (ADVIL) 600 MG tablet Take 1 tablet (600 mg total) by mouth every 8 (eight) hours as needed for headache. 30 tablet 0   losartan (COZAAR) 50 MG tablet Take 1 tablet (50 mg total) by mouth daily. 90 tablet 1   naproxen (NAPROSYN) 500 MG tablet Take 1 tablet (500 mg total) by mouth 2 (two) times daily. 30 tablet 0   SUMAtriptan (IMITREX) 50 MG tablet Take 1 tablet (50 mg total) by mouth once for 1 dose. May repeat in 2 hours if headache persists or recurs. 10  tablet 0   predniSONE (DELTASONE) 20 MG tablet Take 1 tablet (20 mg total) by mouth daily with breakfast. (Patient not taking: Reported on 07/13/2024) 5 tablet 0   No facility-administered medications prior to visit.     ROS Review of Systems  Constitutional:  Negative for activity change and appetite change.  HENT:  Negative for sinus pressure and sore throat.   Respiratory:  Negative for chest tightness, shortness of breath and wheezing.   Cardiovascular:  Negative for chest pain and palpitations.  Gastrointestinal:  Negative for abdominal distention, abdominal pain and constipation.  Genitourinary: Negative.   Musculoskeletal:        See HPI  Psychiatric/Behavioral:  Negative for behavioral problems and dysphoric mood.     Objective:  BP 132/86   Pulse 82   Ht 5' 7 (1.702 m)   Wt 232 lb 6.4 oz (105.4 kg)   SpO2 100%   BMI 36.40 kg/m      07/13/2024   10:02 AM 07/13/2024    9:31 AM 06/07/2024    5:30 AM  BP/Weight  Systolic BP 132 141 130  Diastolic BP 86 93 78  Wt. (Lbs)  232.4   BMI  36.4 kg/m2       Physical Exam Constitutional:      Appearance: She is well-developed.  Cardiovascular:     Rate and Rhythm: Normal rate.     Heart sounds: Normal heart sounds. No murmur heard. Pulmonary:     Effort: Pulmonary effort is normal.     Breath sounds: Normal breath sounds. No wheezing or rales.  Chest:     Chest wall: No tenderness.  Abdominal:     General: Bowel sounds are normal. There is no distension.     Palpations: Abdomen is soft. There is no mass.     Tenderness: There is no abdominal tenderness.  Musculoskeletal:     Right lower leg: No edema.     Left lower leg: No edema.     Comments: Tenderness on palpation of AC joint on the left Negative Hawkin and Neer signs Normal handgrip bilaterally Normal appearance of both knees with no tenderness on range of motion. Tenderness on palpation of inferomedial aspect of left knee  Neurological:     Mental  Status: She is alert and oriented to person, place, and time.  Psychiatric:        Mood and Affect: Mood normal.        Latest Ref Rng & Units 06/07/2024   12:09 AM 08/22/2023    8:09 PM 04/19/2023    3:09 PM  CMP  Glucose 70 - 99 mg/dL 897  891  83   BUN 6 - 20 mg/dL 14  14  13    Creatinine 0.44 - 1.00 mg/dL <9.69  9.29  9.43   Sodium 135 - 145 mmol/L 138  137  139   Potassium 3.5 - 5.1 mmol/L 3.7  3.6  3.9   Chloride 98 - 111 mmol/L 107  108  105   CO2 22 - 32 mmol/L 21  23  21    Calcium 8.9 - 10.3 mg/dL 9.1  8.7  9.1   Total Protein 6.5 - 8.1 g/dL  7.6    Total Bilirubin 0.3 - 1.2 mg/dL  0.5    Alkaline Phos 38 - 126 U/L  42    AST 15 - 41 U/L  14    ALT 0 - 44 U/L  12      Lipid Panel     Component Value Date/Time   CHOL 199 02/25/2022 0951   TRIG 45 02/25/2022 0951   HDL 68 02/25/2022 0951   CHOLHDL 3.1 08/27/2021 1123   CHOLHDL 3.1 03/05/2016 1018   VLDL 9 03/05/2016 1018   LDLCALC 123 (H) 02/25/2022 0951    CBC    Component Value Date/Time   WBC 9.9 06/07/2024 0009   RBC 4.40 06/07/2024 0009   HGB 11.7 (L) 06/07/2024 0009   HGB 11.4 03/25/2017 1127   HCT 37.1 06/07/2024 0009   HCT 35.0 03/25/2017 1127   PLT 288 06/07/2024 0009   PLT 297 03/25/2017 1127   MCV 84.3 06/07/2024 0009   MCV 82 03/25/2017 1127   MCH 26.6 06/07/2024 0009   MCHC 31.5 06/07/2024 0009   RDW 15.0 06/07/2024 0009   RDW 15.6 (H) 03/25/2017 1127   LYMPHSABS 3.4 08/22/2023 2009   LYMPHSABS 2.3 03/25/2017 1127   MONOABS 0.7 08/22/2023 2009   EOSABS 0.2 08/22/2023 2009   EOSABS 0.2 03/25/2017 1127   BASOSABS 0.0 08/22/2023 2009   BASOSABS 0.0 03/25/2017 1127    Lab Results  Component Value Date   HGBA1C 5.9 (A) 09/01/2022       Assessment & Plan Left shoulder acromioclavicular joint arthritis Severe left shoulder pain, especially in the [facility] joint, indicating arthritis. - Order x-ray of the left shoulder. - Prescribe naproxen for inflammation and pain management.  Left  knee pain Left knee pain radiating to thigh and back, no trauma reported. - Order x-ray of the left knee. - Prescribe naproxen for inflammation and pain management. - Weight loss will be beneficial  Essential hypertension Slightly elevated blood pressure, improved on recheck, managed with amlodipine and losartan. - Continue current medications: amlodipine and losartan. - Recheck blood pressure. -Counseled on blood pressure goal of less than 130/80, low-sodium, DASH diet, medication compliance, 150 minutes of moderate intensity exercise per week. Discussed medication compliance, adverse effects.   Prediabetes Previous screening showed prediabetes with A1c of 5.9 - Order blood test to check cholesterol and blood sugar levels.     Healthcare maintenance/screening for colon cancer FIT test ordered  Meds ordered this encounter  Medications   naproxen (NAPROSYN) 500 MG tablet    Sig: Take 1 tablet (500 mg total) by mouth 2 (two) times daily.    Dispense:  60 tablet    Refill:  2   amLODipine (NORVASC) 10 MG tablet    Sig: Take 1 tablet (10 mg total) by mouth daily.    Dispense:  90 tablet    Refill:  1   losartan (COZAAR) 50 MG tablet    Sig: Take 1  tablet (50 mg total) by mouth daily.    Dispense:  90 tablet    Refill:  1    Follow-up: Return in about 6 months (around 01/13/2025) for Chronic medical conditions.       Corrina Sabin, MD, FAAFP. [facility] [place], [place] [phone]   07/13/2024, 12:10 PM

## 2024-07-14 ENCOUNTER — Ambulatory Visit: Payer: Self-pay | Admitting: Family Medicine

## 2024-07-14 LAB — HEMOGLOBIN A1C
Est. average glucose Bld gHb Est-mCnc: 131 mg/dL
Hgb A1c MFr Bld: 6.2 % — ABNORMAL HIGH (ref 4.8–5.6)

## 2024-07-14 LAB — LP+NON-HDL CHOLESTEROL
Cholesterol, Total: 191 mg/dL (ref 100–199)
HDL: 58 mg/dL (ref >39)
LDL Chol Calc (NIH): 121 mg/dL — ABNORMAL HIGH (ref 0–99)
Total Non-HDL-Chol (LDL+VLDL): 133 mg/dL — ABNORMAL HIGH (ref 0–129)
Triglycerides: 66 mg/dL (ref 0–149)
VLDL Cholesterol Cal: 12 mg/dL (ref 5–40)

## 2024-07-18 ENCOUNTER — Other Ambulatory Visit: Payer: Self-pay

## 2024-07-25 ENCOUNTER — Other Ambulatory Visit: Payer: Self-pay

## 2024-08-09 ENCOUNTER — Other Ambulatory Visit: Payer: Self-pay

## 2024-10-23 ENCOUNTER — Other Ambulatory Visit: Payer: Self-pay

## 2025-01-15 ENCOUNTER — Ambulatory Visit: Payer: Self-pay | Admitting: Family Medicine
